# Patient Record
Sex: Female | Born: 1965 | Race: Black or African American | Hispanic: No | Marital: Married | Smoking: Never smoker
Health system: Southern US, Community
[De-identification: ages and names within clinical notes are randomized; demographics above are authoritative.]

## PROBLEM LIST (undated history)

## (undated) DIAGNOSIS — N1330 Unspecified hydronephrosis: Secondary | ICD-10-CM

## (undated) DIAGNOSIS — Z8619 Personal history of other infectious and parasitic diseases: Secondary | ICD-10-CM

## (undated) DIAGNOSIS — R519 Headache, unspecified: Secondary | ICD-10-CM

## (undated) DIAGNOSIS — R51 Headache: Secondary | ICD-10-CM

## (undated) DIAGNOSIS — Z923 Personal history of irradiation: Secondary | ICD-10-CM

## (undated) DIAGNOSIS — Z973 Presence of spectacles and contact lenses: Secondary | ICD-10-CM

## (undated) DIAGNOSIS — Z5111 Encounter for antineoplastic chemotherapy: Secondary | ICD-10-CM

## (undated) DIAGNOSIS — C250 Malignant neoplasm of head of pancreas: Secondary | ICD-10-CM

## (undated) DIAGNOSIS — D649 Anemia, unspecified: Secondary | ICD-10-CM

## (undated) DIAGNOSIS — I10 Essential (primary) hypertension: Secondary | ICD-10-CM

## (undated) DIAGNOSIS — Z8719 Personal history of other diseases of the digestive system: Secondary | ICD-10-CM

## (undated) DIAGNOSIS — J302 Other seasonal allergic rhinitis: Secondary | ICD-10-CM

## (undated) DIAGNOSIS — K219 Gastro-esophageal reflux disease without esophagitis: Secondary | ICD-10-CM

## (undated) HISTORY — DX: Personal history of irradiation: Z92.3

---

## 2003-11-24 ENCOUNTER — Other Ambulatory Visit: Admission: RE | Admit: 2003-11-24 | Discharge: 2003-11-24 | Payer: Self-pay | Admitting: Obstetrics and Gynecology

## 2003-11-30 ENCOUNTER — Ambulatory Visit (HOSPITAL_COMMUNITY): Admission: RE | Admit: 2003-11-30 | Discharge: 2003-11-30 | Payer: Self-pay | Admitting: Obstetrics and Gynecology

## 2004-03-02 ENCOUNTER — Ambulatory Visit (HOSPITAL_COMMUNITY): Admission: RE | Admit: 2004-03-02 | Discharge: 2004-03-02 | Payer: Self-pay | Admitting: Obstetrics and Gynecology

## 2004-05-12 ENCOUNTER — Inpatient Hospital Stay (HOSPITAL_COMMUNITY): Admission: AD | Admit: 2004-05-12 | Discharge: 2004-05-17 | Payer: Self-pay | Admitting: Obstetrics and Gynecology

## 2004-05-18 ENCOUNTER — Inpatient Hospital Stay (HOSPITAL_COMMUNITY): Admission: AD | Admit: 2004-05-18 | Discharge: 2004-05-18 | Payer: Self-pay | Admitting: Obstetrics and Gynecology

## 2004-06-27 ENCOUNTER — Other Ambulatory Visit: Admission: RE | Admit: 2004-06-27 | Discharge: 2004-06-27 | Payer: Self-pay | Admitting: Obstetrics and Gynecology

## 2004-07-26 ENCOUNTER — Encounter: Admission: RE | Admit: 2004-07-26 | Discharge: 2004-08-25 | Payer: Self-pay | Admitting: Obstetrics and Gynecology

## 2004-08-26 ENCOUNTER — Encounter: Admission: RE | Admit: 2004-08-26 | Discharge: 2004-09-25 | Payer: Self-pay | Admitting: Obstetrics and Gynecology

## 2004-10-26 ENCOUNTER — Encounter: Admission: RE | Admit: 2004-10-26 | Discharge: 2004-11-25 | Payer: Self-pay | Admitting: Obstetrics and Gynecology

## 2004-12-26 ENCOUNTER — Encounter: Admission: RE | Admit: 2004-12-26 | Discharge: 2005-01-25 | Payer: Self-pay | Admitting: Obstetrics and Gynecology

## 2005-01-26 ENCOUNTER — Encounter: Admission: RE | Admit: 2005-01-26 | Discharge: 2005-02-25 | Payer: Self-pay | Admitting: Obstetrics and Gynecology

## 2005-03-26 ENCOUNTER — Encounter: Admission: RE | Admit: 2005-03-26 | Discharge: 2005-04-25 | Payer: Self-pay | Admitting: Obstetrics and Gynecology

## 2005-05-26 ENCOUNTER — Encounter: Admission: RE | Admit: 2005-05-26 | Discharge: 2005-06-25 | Payer: Self-pay | Admitting: Obstetrics and Gynecology

## 2005-07-26 ENCOUNTER — Encounter: Admission: RE | Admit: 2005-07-26 | Discharge: 2005-08-25 | Payer: Self-pay | Admitting: Obstetrics and Gynecology

## 2005-08-07 ENCOUNTER — Other Ambulatory Visit: Admission: RE | Admit: 2005-08-07 | Discharge: 2005-08-07 | Payer: Self-pay | Admitting: Obstetrics and Gynecology

## 2005-08-26 ENCOUNTER — Encounter: Admission: RE | Admit: 2005-08-26 | Discharge: 2005-09-21 | Payer: Self-pay | Admitting: Obstetrics and Gynecology

## 2012-01-04 ENCOUNTER — Emergency Department (HOSPITAL_BASED_OUTPATIENT_CLINIC_OR_DEPARTMENT_OTHER)
Admission: EM | Admit: 2012-01-04 | Discharge: 2012-01-04 | Disposition: A | Discharge status: Home / Self Care | Attending: Emergency Medicine | Admitting: Emergency Medicine

## 2012-01-04 ENCOUNTER — Encounter (HOSPITAL_BASED_OUTPATIENT_CLINIC_OR_DEPARTMENT_OTHER): Payer: Self-pay

## 2012-01-04 DIAGNOSIS — M25561 Pain in right knee: Secondary | ICD-10-CM

## 2012-01-04 DIAGNOSIS — Y9289 Other specified places as the place of occurrence of the external cause: Secondary | ICD-10-CM | POA: Insufficient documentation

## 2012-01-04 DIAGNOSIS — I10 Essential (primary) hypertension: Secondary | ICD-10-CM | POA: Insufficient documentation

## 2012-01-04 DIAGNOSIS — W010XXA Fall on same level from slipping, tripping and stumbling without subsequent striking against object, initial encounter: Secondary | ICD-10-CM | POA: Insufficient documentation

## 2012-01-04 DIAGNOSIS — S93409A Sprain of unspecified ligament of unspecified ankle, initial encounter: Secondary | ICD-10-CM | POA: Insufficient documentation

## 2012-01-04 HISTORY — DX: Other seasonal allergic rhinitis: J30.2

## 2012-01-04 HISTORY — DX: Essential (primary) hypertension: I10

## 2012-01-04 NOTE — ED Notes (Signed)
Slipped on ice/fall 2 days ago-was seen by PCP/workers comp-no xrays-was given meloxicam rx

## 2012-01-04 NOTE — ED Notes (Signed)
Pt was given ice pack by RT per EDP request-is not in room when in for d/c instructions

## 2012-01-13 NOTE — ED Provider Notes (Signed)
History    46 year old female with left knee pain. Patient slipped and fell on ice outside of work about 2 days ago.  Was evaluated by PCP but no imaging studies were performed. Patient has had persistent pain since. Has been ambulatory. No numbness, tingling  or loss of strength. Denies the pain anywhere else. Denies interim trauma.  CSN: [id number]  Arrival date & time 01/04/12  1058   First MD Initiated Contact with Patient 01/04/12 1113      Chief Complaint  Patient presents with  . Fall    (Consider location/radiation/quality/duration/timing/severity/associated sxs/prior treatment) HPI  Past Medical History  Diagnosis Date  . Hypertension   . Herpes   . Seasonal allergies     Past Surgical History  Procedure Date  . Cesarean section     No family history on file.  History  Substance Use Topics  . Smoking status: Never Smoker   . Smokeless tobacco: Not on file  . Alcohol Use: No    OB History    Grav Para Term Preterm Abortions TAB SAB Ect Mult Living                  Review of Systems   Review of symptoms negative unless otherwise noted in HPI.   Allergies  Review of patient's allergies indicates no known allergies.  Home Medications   Current Outpatient Rx  Name Route Sig Dispense Refill  . ZYRTEC PO Oral Take by mouth.    Marland Kitchen HYDROCHLOROTHIAZIDE PO Oral Take by mouth.    . MELOXICAM PO Oral Take by mouth.    . SUDAFED PO Oral Take by mouth.    Marland Kitchen VALTREX PO Oral Take by mouth.      BP 174/116  Pulse 98  Temp(Src) 98.3 F (36.8 C) (Oral)  Resp 16  Ht 5\' 5"  (1.651 m)  Wt 255 lb (115.667 kg)  BMI 42.43 kg/m2  SpO2 100%  Physical Exam  Nursing note and vitals reviewed. Constitutional: No distress.       Sitting up in bed. No acute distress. Obese  HENT:  Head: Normocephalic and atraumatic.  Eyes: Conjunctivae are normal. Right eye exhibits no discharge. Left eye exhibits no discharge.  Neck: Neck supple.  Cardiovascular: Normal rate,  regular rhythm and normal heart sounds.  Exam reveals no gallop and no friction rub.   No murmur heard. Pulmonary/Chest: Effort normal and breath sounds normal. No respiratory distress.  Musculoskeletal: She exhibits tenderness. She exhibits no edema.       Lower extremities normal to inspection and symmetric as compared to each other. No deformities noted. No knee effusion appreciated. Mild tenderness along the lateral aspect of the left knee.No significant increased pain with range of motion. Skin is intact. no overlying skin changes. No ligamentous laxity. Patient is neurovascularly intact distally. Patient has a normal appearing gait.  Neurological: She is alert. She exhibits normal muscle tone.  Skin: Skin is warm and dry.  Psychiatric: She has a normal mood and affect. Her behavior is normal. Thought content normal.    ED Course  Procedures (including critical care time)  Labs Reviewed - No data to display No results found.   1. Knee pain, right   2. Ankle sprain       MDM  46 year old female with knee pain after fall 2 days ago. Exam fairly unremarkable aside from mild tenderness. Patient able to ambulate without difficulty. Suspect that maging be very low yield. If anything patient may benefit from  an MRI but there is no ligamentous laxity appreciated on my examination did not feel that this is needed emergently. Plan symptomatic treatment and outpatient followup.        Raeford Razor, MD 01/13/12 2034

## 2013-09-17 ENCOUNTER — Ambulatory Visit (INDEPENDENT_AMBULATORY_CARE_PROVIDER_SITE_OTHER): Payer: 59 | Admitting: General Surgery

## 2013-09-17 ENCOUNTER — Encounter (INDEPENDENT_AMBULATORY_CARE_PROVIDER_SITE_OTHER): Payer: Self-pay | Admitting: General Surgery

## 2013-09-17 ENCOUNTER — Encounter (INDEPENDENT_AMBULATORY_CARE_PROVIDER_SITE_OTHER): Payer: Self-pay

## 2013-09-17 VITALS — BP 138/90 | HR 84 | Temp 97.1°F | Resp 24 | Ht 64.0 in | Wt 222.0 lb

## 2013-09-17 DIAGNOSIS — Z9889 Other specified postprocedural states: Secondary | ICD-10-CM

## 2013-09-17 DIAGNOSIS — K219 Gastro-esophageal reflux disease without esophagitis: Secondary | ICD-10-CM

## 2013-09-17 DIAGNOSIS — K828 Other specified diseases of gallbladder: Secondary | ICD-10-CM | POA: Insufficient documentation

## 2013-09-17 DIAGNOSIS — I10 Essential (primary) hypertension: Secondary | ICD-10-CM

## 2013-09-17 DIAGNOSIS — Z8719 Personal history of other diseases of the digestive system: Secondary | ICD-10-CM

## 2013-09-17 DIAGNOSIS — Z98891 History of uterine scar from previous surgery: Secondary | ICD-10-CM

## 2013-09-17 NOTE — Progress Notes (Signed)
Patient ID: Frances [name], female   DOB: 1966-11-24, 47 y.o.   MRN: [id number]  Chief Complaint  Patient presents with  . Cholelithiasis    HPI Frances [name] is a 47 y.o. female.  She is referred by Dr. Marcelene Butte in [facility] for evaluation of biliary dyskinesia. Dr. Watt Climes is her PCP in [place].  She has no real chronic GI problems other than reflux. She doesn't really have much in the way of heartburn but she does have waterbrash. She could not tolerated AcipHex, but has been on omeprazole for 30 days.  Since July, she has been having intermittent episodes of epigastric pain radiating to her back. She gets cramps. She feels bloated after meals. She has mild nausea but no vomiting. This is now happening daily. Bowel movements are not really changing. She had an ultrasound on August 9 which shows a small right hepatic cyst. Gallbladder looked normal. She had an upper endoscopy on 08/13/2013. I have that report. This shows gastritis and duodenitis. Biopsies showed mild inflammation. The esophagus looked normal. H. Pylori was normal. Hepatobiliary scan with ejection fraction was performed on September 29. Ejection fraction was low at 15%, normal being greater than 33%. Cystic duct patent. She says she had some cramping after drinking the Ensure. She is in no distress today.  Past history reveals the GERD, hypertension, cesarean section 2005. Her grandmother had a cholecystectomy but no other family members. She is married with one child he works for [employer]. Denies tobacco. Drinks wine occasionally. HPI  Past Medical History  Diagnosis Date  . Hypertension   . Herpes   . Seasonal allergies     Past Surgical History  Procedure Laterality Date  . Cesarean section      No family history on file.  Social History History  Substance Use Topics  . Smoking status: Never Smoker   . Smokeless tobacco: Not on file  . Alcohol Use: No    No Known  Allergies  Current Outpatient Prescriptions  Medication Sig Dispense Refill  . dicyclomine (BENTYL) 10 MG capsule Take 20 mg by mouth every 6 (six) hours.      Marland Kitchen HYDROCHLOROTHIAZIDE PO Take by mouth daily.       Marland Kitchen omeprazole (PRILOSEC) 40 MG capsule Take 40 mg by mouth daily.      . promethazine (PHENERGAN) 12.5 MG tablet Take 25 mg by mouth every 6 (six) hours as needed for nausea.       No current facility-administered medications for this visit.    Review of Systems Review of Systems  Constitutional: Negative for fever, chills and unexpected weight change.  HENT: Negative for congestion, hearing loss, sore throat, trouble swallowing and voice change.   Eyes: Negative for visual disturbance.  Respiratory: Negative for cough and wheezing.   Cardiovascular: Negative for chest pain, palpitations and leg swelling.  Gastrointestinal: Positive for nausea, abdominal pain and abdominal distention. Negative for vomiting, diarrhea, constipation, blood in stool and anal bleeding.  Genitourinary: Negative for hematuria, vaginal bleeding and difficulty urinating.  Musculoskeletal: Positive for back pain. Negative for arthralgias.  Skin: Negative for rash and wound.  Neurological: Negative for seizures, syncope and headaches.  Hematological: Negative for adenopathy. Does not bruise/bleed easily.  Psychiatric/Behavioral: Negative for confusion.    Blood pressure 138/90, pulse 84, temperature 97.1 F (36.2 C), temperature source Oral, resp. rate 24, height 5\' 4"  (1.626 m), weight 222 lb (100.699 kg).  Physical Exam Physical Exam  Constitutional: She is oriented  to person, place, and time. She appears well-developed and well-nourished. No distress.  BMI 37.93  HENT:  Head: Normocephalic and atraumatic.  Nose: Nose normal.  Mouth/Throat: No oropharyngeal exudate.  Eyes: Conjunctivae and EOM are normal. Pupils are equal, round, and reactive to light. Left eye exhibits no discharge. No scleral  icterus.  Neck: Neck supple. No JVD present. No tracheal deviation present. No thyromegaly present.  Cardiovascular: Normal rate, regular rhythm, normal heart sounds and intact distal pulses.   No murmur heard. Pulmonary/Chest: Effort normal and breath sounds normal. No respiratory distress. She has no wheezes. She has no rales. She exhibits no tenderness.  Abdominal: Soft. Bowel sounds are normal. She exhibits no distension and no mass. There is no tenderness. There is no rebound and no guarding.  Well healed Pfannenstiel incision.  Musculoskeletal: She exhibits no edema and no tenderness.  Lymphadenopathy:    She has no cervical adenopathy.  Neurological: She is alert and oriented to person, place, and time. She exhibits normal muscle tone. Coordination normal.  Skin: Skin is warm. No rash noted. She is not diaphoretic. No erythema. No pallor.  Psychiatric: She has a normal mood and affect. Her behavior is normal. Judgment and thought content normal.    Data Reviewed Lots of note from her physicians, including gastroenterologist. Endoscopy report. Imaging studies. Laboratory work. Labs were not abnormal.  Assessment    History and symptom complex, coupled with workup to date show a suggestion acalculous cholecystitis or biliary dyskinesia. I discussed this with her in great detail. I offered her the option of elective cholecystectomy and she would like to do that.  GERD. I told her this was independent and she might continue to have waterbrash and might require further medical management.  Hypertension  History cesarean section  Obesity, BMI of 37.9     Plan    She will be scheduled for elective laparoscopic cholecystectomy with cholangiogram, possible open  I discussed the indications, details, techniques, and numerous risks of this surgery with her. She's aware of the risk of bleeding, infection, conversion to open laparotomy, bile leak, wound hernia, injury to adjacent organs  with  major reconstructive surgery. She is aware that there is a chance that the symptoms may not resolve  following cholecystectomy she may require more extensive GI evaluation. We talked about gastroparesis and I thought she was relatively low risk for that disorder. She understands all these issues. her questions were answered. She agrees with this plan.        Angelia Mould. Derrell Lolling, M.D., [facility] General and Minimally invasive Surgery Breast and Colorectal Surgery Office:   [phone] Pager:   [phone]  09/17/2013, 10:01 AM

## 2013-09-17 NOTE — Patient Instructions (Signed)
Considering your symptoms and the extensive workup and evaluation to date, I think that it is very likely that you are having gallbladder attacks.  The chance of improving or resolving your upper abdominal pain with a gallbladder operation is therefore 80% or greater.  We talked about your options, and you have decided to go ahead with a gallbladder operation. You will be scheduled for a laparoscopic cholecystectomy with cholangiogram, possible open, in the near future.      Laparoscopic Cholecystectomy Laparoscopic cholecystectomy is surgery to remove the gallbladder. The gallbladder is located slightly to the right of center in the abdomen, behind the liver. It is a concentrating and storage sac for the bile produced in the liver. Bile aids in the digestion and absorption of fats. Gallbladder disease (cholecystitis) is an inflammation of your gallbladder. This condition is usually caused by a buildup of gallstones (cholelithiasis) in your gallbladder. Gallstones can block the flow of bile, resulting in inflammation and pain. In severe cases, emergency surgery may be required. When emergency surgery is not required, you will have time to prepare for the procedure. Laparoscopic surgery is an alternative to open surgery. Laparoscopic surgery usually has a shorter recovery time. Your common bile duct may also need to be examined and explored. Your caregiver will discuss this with you if he or she feels this should be done. If stones are found in the common bile duct, they may be removed. LET YOUR CAREGIVER KNOW ABOUT:  Allergies to food or medicine.  Medicines taken, including vitamins, herbs, eyedrops, over-the-counter medicines, and creams.  Use of steroids (by mouth or creams).  Previous problems with anesthetics or numbing medicines.  History of bleeding problems or blood clots.  Previous surgery.  Other health problems, including diabetes and kidney problems.  Possibility of  pregnancy, if this applies. RISKS AND COMPLICATIONS All surgery is associated with risks. Some problems that may occur following this procedure include:  Infection.  Damage to the common bile duct, nerves, arteries, veins, or other internal organs such as the stomach or intestines.  Bleeding.  A stone may remain in the common bile duct. BEFORE THE PROCEDURE  Do not take aspirin for 3 days prior to surgery or blood thinners for 1 week prior to surgery.  Do not eat or drink anything after midnight the night before surgery.  Let your caregiver know if you develop a cold or other infectious problem prior to surgery.  You should be present 60 minutes before the procedure or as directed. PROCEDURE  You will be given medicine that makes you sleep (general anesthetic). When you are asleep, your surgeon will make several small cuts (incisions) in your abdomen. One of these incisions is used to insert a small, lighted scope (laparoscope) into the abdomen. The laparoscope helps the surgeon see into your abdomen. Carbon dioxide gas will be pumped into your abdomen. The gas allows more room for the surgeon to perform your surgery. Other operating instruments are inserted through the other incisions. Laparoscopic procedures may not be appropriate when:  There is major scarring from previous surgery.  The gallbladder is extremely inflamed.  There are bleeding disorders or unexpected cirrhosis of the liver.  A pregnancy is near term.  Other conditions make the laparoscopic procedure impossible. If your surgeon feels it is not safe to continue with a laparoscopic procedure, he or she will perform an open abdominal procedure. In this case, the surgeon will make an incision to open the abdomen. This gives the surgeon a  larger view and field to work within. This may allow the surgeon to perform procedures that sometimes cannot be performed with a laparoscope alone. Open surgery has a longer recovery  time. AFTER THE PROCEDURE  You will be taken to the recovery area where a nurse will watch and check your progress.  You may be allowed to go home the same day.  Do not resume physical activities until directed by your caregiver.  You may resume a normal diet and activities as directed. Document Released: 11/26/2005 Document Revised: 02/18/2012 Document Reviewed: 05/11/2011 [facility] Patient Information 2014 [place], [place].

## 2013-09-22 NOTE — Progress Notes (Signed)
Dr. Derrell Lolling  -- Frances [name] is coming to [facility] on 10/21 for her preop appt / labs.  Please enter her preop orders in Epic.   Thanks.

## 2013-09-23 ENCOUNTER — Other Ambulatory Visit (INDEPENDENT_AMBULATORY_CARE_PROVIDER_SITE_OTHER): Payer: Self-pay | Admitting: General Surgery

## 2013-09-24 ENCOUNTER — Encounter (HOSPITAL_COMMUNITY): Payer: Self-pay | Admitting: Pharmacy Technician

## 2013-09-29 ENCOUNTER — Ambulatory Visit (HOSPITAL_COMMUNITY)
Admission: RE | Admit: 2013-09-29 | Discharge: 2013-09-29 | Disposition: A | Discharge status: Home / Self Care | Payer: 59 | Source: Ambulatory Visit | Attending: General Surgery | Admitting: General Surgery

## 2013-09-29 ENCOUNTER — Ambulatory Visit (INDEPENDENT_AMBULATORY_CARE_PROVIDER_SITE_OTHER): Payer: 59 | Admitting: General Surgery

## 2013-09-29 ENCOUNTER — Encounter (HOSPITAL_COMMUNITY)
Admission: RE | Admit: 2013-09-29 | Discharge: 2013-09-29 | Disposition: A | Discharge status: Home / Self Care | Payer: 59 | Source: Ambulatory Visit | Attending: General Surgery | Admitting: General Surgery

## 2013-09-29 ENCOUNTER — Encounter (HOSPITAL_COMMUNITY): Payer: Self-pay

## 2013-09-29 DIAGNOSIS — K828 Other specified diseases of gallbladder: Secondary | ICD-10-CM | POA: Insufficient documentation

## 2013-09-29 DIAGNOSIS — Z01810 Encounter for preprocedural cardiovascular examination: Secondary | ICD-10-CM | POA: Insufficient documentation

## 2013-09-29 DIAGNOSIS — Z01818 Encounter for other preprocedural examination: Secondary | ICD-10-CM | POA: Insufficient documentation

## 2013-09-29 DIAGNOSIS — I10 Essential (primary) hypertension: Secondary | ICD-10-CM | POA: Insufficient documentation

## 2013-09-29 DIAGNOSIS — Z01812 Encounter for preprocedural laboratory examination: Secondary | ICD-10-CM | POA: Insufficient documentation

## 2013-09-29 HISTORY — DX: Gastro-esophageal reflux disease without esophagitis: K21.9

## 2013-09-29 LAB — CBC WITH DIFFERENTIAL/PLATELET
Basophils Absolute: 0 10*3/uL (ref 0.0–0.1)
Basophils Relative: 1 % (ref 0–1)
HCT: 39.1 % (ref 36.0–46.0)
Hemoglobin: 12.7 g/dL (ref 12.0–15.0)
MCH: 25.8 pg — ABNORMAL LOW (ref 26.0–34.0)
Monocytes Absolute: 0.3 10*3/uL (ref 0.1–1.0)
Neutro Abs: 2.3 10*3/uL (ref 1.7–7.7)
Neutrophils Relative %: 55 % (ref 43–77)
Platelets: 244 10*3/uL (ref 150–400)
RBC: 4.92 MIL/uL (ref 3.87–5.11)

## 2013-09-29 LAB — COMPREHENSIVE METABOLIC PANEL
Alkaline Phosphatase: 95 U/L (ref 39–117)
Chloride: 99 mEq/L (ref 96–112)
GFR calc Af Amer: >90 mL/min (ref >90)
GFR calc non Af Amer: 88 mL/min — ABNORMAL LOW (ref >90)
Potassium: 3.6 mEq/L (ref 3.5–5.1)
Total Bilirubin: 0.2 mg/dL — ABNORMAL LOW (ref 0.3–1.2)

## 2013-09-29 LAB — HCG, SERUM, QUALITATIVE: Preg, Serum: NEGATIVE

## 2013-09-29 MED ORDER — CHLORHEXIDINE GLUCONATE 4 % EX LIQD
1.0000 "application " | Freq: Once | CUTANEOUS | Status: DC
  Filled 2013-09-29: qty 15

## 2013-09-29 NOTE — Patient Instructions (Signed)
YOUR SURGERY IS SCHEDULED AT [facility]  ON:   Friday  10/24  REPORT TO [facility] AT:  5:30 AM      PHONE # FOR SHORT STAY IS [phone]  DO NOT EAT OR DRINK ANYTHING AFTER MIDNIGHT THE NIGHT BEFORE YOUR SURGERY.  YOU MAY BRUSH YOUR TEETH, RINSE OUT YOUR MOUTH--BUT NO WATER, NO FOOD, NO CHEWING GUM, NO MINTS, NO CANDIES, NO CHEWING TOBACCO.  PLEASE TAKE THE FOLLOWING MEDICATIONS THE AM OF YOUR SURGERY WITH A FEW SIPS OF WATER:  OMEPRAZOLE  - PHENERGAN IF NEEDED FOR NAUSEA    DO NOT BRING VALUABLES, MONEY, CREDIT CARDS.  DO NOT WEAR JEWELRY, MAKE-UP, NAIL POLISH AND NO METAL PINS OR CLIPS IN YOUR HAIR. CONTACT LENS, DENTURES / PARTIALS, GLASSES SHOULD NOT BE WORN TO SURGERY AND IN MOST CASES-HEARING AIDS WILL NEED TO BE REMOVED.  BRING YOUR GLASSES CASE, ANY EQUIPMENT NEEDED FOR YOUR CONTACT LENS. FOR PATIENTS ADMITTED TO THE HOSPITAL--CHECK OUT TIME THE DAY OF DISCHARGE IS 11:00 AM.  ALL INPATIENT ROOMS ARE PRIVATE - WITH BATHROOM, TELEPHONE, TELEVISION AND WIFI INTERNET.  IF YOU ARE BEING DISCHARGED THE SAME DAY OF YOUR SURGERY--YOU CAN NOT DRIVE YOURSELF HOME--AND SHOULD NOT GO HOME ALONE BY TAXI OR BUS.  NO DRIVING OR OPERATING MACHINERY FOR 24 HOURS FOLLOWING ANESTHESIA / PAIN MEDICATIONS.  PLEASE MAKE ARRANGEMENTS FOR SOMEONE TO BE WITH YOU AT HOME THE FIRST 24 HOURS AFTER SURGERY. RESPONSIBLE DRIVER'S NAME  MOTHER  JACKIE BASLEY  WILL BE WITH PATIENT                                               PHONE #   [phone]                              FAILURE TO FOLLOW THESE INSTRUCTIONS MAY RESULT IN THE CANCELLATION OF YOUR SURGERY.   PATIENT SIGNATURE_________________________________

## 2013-09-29 NOTE — Pre-Procedure Instructions (Signed)
PT'S B/P ELEVATED AT 168/114, HEART RATE 113 - SHE STATES SHE IS HAVING ABDOMINAL PAIN TODAY RELATED TO HER GALLBLADDER PROBLEM - SHE IS SCHEDULED FOR CHOLECYSTECTOMY ON Friday 10/24 AND IS HERE AT [facility] TODAY FOR PREOP.  PT STATES HER PAIN IS NOT ANY WORSE THAN IT HAS BEEN.  PT INSTRUCTED TO GO TO ER OR CALL DR. INGRAM IF SHE DOES FEEL THAT HER PAIN WORSENS - SHE STATES DR. INGRAM HAS ALSO GIVEN HER THOSE INSTRUCTIONS.  HER HEART RATE WAS 105 ON HER PREOP EKG. EKG AND CXR WERE DONE TODAY - PER ANESTHESIOLOGIST'S GUIDELINES.

## 2013-10-01 NOTE — H&P (Signed)
Frances [name]   MRN:  [id number]   Description: 47 year old female  Provider: [name], MD  Department: [facility]        Diagnoses    Biliary dyskinesia    -  Primary    575.8    GERD (gastroesophageal reflux disease)        530.81    Hypertension        401.9    History of cesarean section        V45.89    History of gastritis        V12.79      Reason for Visit    Cholelithiasis        Current Vitals   BP Pulse Temp(Src) Resp Ht Wt    138/90 84 97.1 F (36.2 C) (Oral) 24 5\' 4"  (1.626 m) 222 lb (100.699 kg)       BMI - 38.09 kg/m2                    History and Physical   [name], MD     Status: Signed                   HPI Frances [name] is a 47 y.o. female.  She is referred by Dr. Marcelene Butte in [facility] for evaluation of biliary dyskinesia. Dr. Watt Climes is her PCP in [place].   She has no real chronic GI problems other than reflux. She doesn't really have much in the way of heartburn but she does have waterbrash. She could not tolerated AcipHex, but has been on omeprazole for 30 days.   Since July, she has been having intermittent episodes of epigastric pain radiating to her back. She gets cramps. She feels bloated after meals. She has mild nausea but no vomiting. This is now happening daily. Bowel movements are not really changing. She had an ultrasound on August 9 which shows a small right hepatic cyst. Gallbladder looked normal. She had an upper endoscopy on 08/13/2013. I have that report. This shows gastritis and duodenitis. Biopsies showed mild inflammation. The esophagus looked normal. H. Pylori was normal. Hepatobiliary scan with ejection fraction was performed on September 29. Ejection fraction was low at 15%, normal being greater than 33%. Cystic duct patent. She says she had some cramping after drinking the Ensure. She is in no distress today.   Past history reveals the GERD, hypertension, cesarean section  2005. Her grandmother had a cholecystectomy but no other family members. She is married with one child he works for [employer]. Denies tobacco. Drinks wine occasionally.        Past Medical History   Diagnosis  Date   .  Hypertension     .  Herpes     .  Seasonal allergies           Past Surgical History   Procedure  Laterality  Date   .  Cesarean section            No family history on file.   Social History History   Substance Use Topics   .  Smoking status:  Never Smoker    .  Smokeless tobacco:  Not on file   .  Alcohol Use:  No        No Known Allergies    Current Outpatient Prescriptions   Medication  Sig  Dispense  Refill   .  dicyclomine (BENTYL)  10 MG capsule  Take 20 mg by mouth every 6 (six) hours.         Marland Kitchen  HYDROCHLOROTHIAZIDE PO  Take by mouth daily.          Marland Kitchen  omeprazole (PRILOSEC) 40 MG capsule  Take 40 mg by mouth daily.         .  promethazine (PHENERGAN) 12.5 MG tablet  Take 25 mg by mouth every 6 (six) hours as needed for nausea.       .        Review of Systems   Constitutional: Negative for fever, chills and unexpected weight change.  HENT: Negative for congestion, hearing loss, sore throat, trouble swallowing and voice change.   Eyes: Negative for visual disturbance.  Respiratory: Negative for cough and wheezing.   Cardiovascular: Negative for chest pain, palpitations and leg swelling.  Gastrointestinal: Positive for nausea, abdominal pain and abdominal distention. Negative for vomiting, diarrhea, constipation, blood in stool and anal bleeding.  Genitourinary: Negative for hematuria, vaginal bleeding and difficulty urinating.  Musculoskeletal: Positive for back pain. Negative for arthralgias.  Skin: Negative for rash and wound.  Neurological: Negative for seizures, syncope and headaches.  Hematological: Negative for adenopathy. Does not bruise/bleed easily.  Psychiatric/Behavioral: Negative for confusion.       Blood pressure 138/90, pulse 84, temperature 97.1 F (36.2 C), temperature source Oral, resp. rate 24, height 5\' 4"  (1.626 m), weight 222 lb (100.699 kg).   Physical Exam   Constitutional: She is oriented to person, place, and time. She appears well-developed and well-nourished. No distress.  BMI 37.93  HENT:   Head: Normocephalic and atraumatic.   Nose: Nose normal.   Mouth/Throat: No oropharyngeal exudate.  Eyes: Conjunctivae and EOM are normal. Pupils are equal, round, and reactive to light. Left eye exhibits no discharge. No scleral icterus.  Neck: Neck supple. No JVD present. No tracheal deviation present. No thyromegaly present.  Cardiovascular: Normal rate, regular rhythm, normal heart sounds and intact distal pulses.    No murmur heard. Pulmonary/Chest: Effort normal and breath sounds normal. No respiratory distress. She has no wheezes. She has no rales. She exhibits no tenderness.  Abdominal: Soft. Bowel sounds are normal. She exhibits no distension and no mass. There is no tenderness. There is no rebound and no guarding.  Well healed Pfannenstiel incision.  Musculoskeletal: She exhibits no edema and no tenderness.  Lymphadenopathy:    She has no cervical adenopathy.  Neurological: She is alert and oriented to person, place, and time. She exhibits normal muscle tone. Coordination normal.  Skin: Skin is warm. No rash noted. She is not diaphoretic. No erythema. No pallor.  Psychiatric: She has a normal mood and affect. Her behavior is normal. Judgment and thought content normal.      Data Reviewed Lots of note from her physicians, including gastroenterologist. Endoscopy report. Imaging studies. Laboratory work. Labs were not abnormal.   Assessment    History and symptom complex, coupled with workup to date show a suggestion acalculous cholecystitis or biliary dyskinesia. I discussed this with her in great detail. I offered her the option of elective cholecystectomy  and she would like to do that.   GERD. I told her this was independent and she might continue to have waterbrash and might require further medical management.   Hypertension   History cesarean section   Obesity, BMI of 37.9      Plan    She will be scheduled for elective laparoscopic cholecystectomy with  cholangiogram, possible open   I discussed the indications, details, techniques, and numerous risks of this surgery with her. She's aware of the risk of bleeding, infection, conversion to open laparotomy, bile leak, wound hernia, injury to adjacent organs with  major reconstructive surgery. She is aware that there is a chance that the symptoms may not resolve  following cholecystectomy she may require more extensive GI evaluation. We talked about gastroparesis and I thought she was relatively low risk for that disorder. She understands all these issues. her questions were answered. She agrees with this plan.           Angelia Mould. Derrell Lolling, M.D., [facility] General and Minimally invasive Surgery Breast and Colorectal Surgery Office:   [phone] Pager:   [phone]

## 2013-10-02 ENCOUNTER — Encounter (HOSPITAL_COMMUNITY): Payer: Self-pay | Admitting: *Deleted

## 2013-10-02 ENCOUNTER — Encounter (HOSPITAL_COMMUNITY): Payer: 59 | Admitting: Certified Registered Nurse Anesthetist

## 2013-10-02 ENCOUNTER — Ambulatory Visit (HOSPITAL_COMMUNITY)
Admission: RE | Admit: 2013-10-02 | Discharge: 2013-10-03 | Disposition: A | Discharge status: Home / Self Care | Payer: 59 | Source: Ambulatory Visit | Attending: General Surgery | Admitting: General Surgery

## 2013-10-02 ENCOUNTER — Ambulatory Visit (HOSPITAL_COMMUNITY): Payer: 59

## 2013-10-02 ENCOUNTER — Encounter (HOSPITAL_COMMUNITY)
Admission: RE | Disposition: A | Discharge status: Home / Self Care | Payer: Self-pay | Source: Ambulatory Visit | Attending: General Surgery

## 2013-10-02 ENCOUNTER — Ambulatory Visit (HOSPITAL_COMMUNITY): Payer: 59 | Admitting: Certified Registered Nurse Anesthetist

## 2013-10-02 DIAGNOSIS — I10 Essential (primary) hypertension: Secondary | ICD-10-CM | POA: Insufficient documentation

## 2013-10-02 DIAGNOSIS — R7401 Elevation of levels of liver transaminase levels: Secondary | ICD-10-CM | POA: Insufficient documentation

## 2013-10-02 DIAGNOSIS — Z79899 Other long term (current) drug therapy: Secondary | ICD-10-CM | POA: Insufficient documentation

## 2013-10-02 DIAGNOSIS — K828 Other specified diseases of gallbladder: Secondary | ICD-10-CM | POA: Diagnosis present

## 2013-10-02 DIAGNOSIS — K869 Disease of pancreas, unspecified: Secondary | ICD-10-CM | POA: Insufficient documentation

## 2013-10-02 DIAGNOSIS — K219 Gastro-esophageal reflux disease without esophagitis: Secondary | ICD-10-CM | POA: Insufficient documentation

## 2013-10-02 DIAGNOSIS — E669 Obesity, unspecified: Secondary | ICD-10-CM | POA: Insufficient documentation

## 2013-10-02 DIAGNOSIS — R000 Tachycardia, unspecified: Secondary | ICD-10-CM | POA: Insufficient documentation

## 2013-10-02 DIAGNOSIS — K811 Chronic cholecystitis: Secondary | ICD-10-CM

## 2013-10-02 DIAGNOSIS — R7402 Elevation of levels of lactic acid dehydrogenase (LDH): Secondary | ICD-10-CM | POA: Insufficient documentation

## 2013-10-02 HISTORY — PX: CHOLECYSTECTOMY: SHX55

## 2013-10-02 SURGERY — LAPAROSCOPIC CHOLECYSTECTOMY WITH INTRAOPERATIVE CHOLANGIOGRAM
Anesthesia: General | Site: Abdomen | Wound class: Contaminated

## 2013-10-02 MED ORDER — BUPIVACAINE-EPINEPHRINE PF 0.25-1:200000 % IJ SOLN
INTRAMUSCULAR | Status: DC | PRN
  Administered 2013-10-02: 20 mL

## 2013-10-02 MED ORDER — METOCLOPRAMIDE HCL 5 MG/ML IJ SOLN
INTRAMUSCULAR | Status: DC | PRN
  Administered 2013-10-02: 5 mg via INTRAVENOUS

## 2013-10-02 MED ORDER — MEPERIDINE HCL 50 MG/ML IJ SOLN: 6.2500 mg | INTRAMUSCULAR | Status: DC | PRN

## 2013-10-02 MED ORDER — IOHEXOL 300 MG/ML  SOLN
INTRAMUSCULAR | Status: DC | PRN
  Administered 2013-10-02: 12 mL

## 2013-10-02 MED ORDER — FENTANYL CITRATE 0.05 MG/ML IJ SOLN
INTRAMUSCULAR | Status: DC | PRN
  Administered 2013-10-02 (×2): 100 ug via INTRAVENOUS
  Administered 2013-10-02 (×3): 50 ug via INTRAVENOUS

## 2013-10-02 MED ORDER — ONDANSETRON HCL 4 MG/2ML IJ SOLN
INTRAMUSCULAR | Status: DC | PRN
  Administered 2013-10-02 (×2): 2 mg via INTRAVENOUS

## 2013-10-02 MED ORDER — LACTATED RINGERS IV SOLN
INTRAVENOUS | Status: DC | PRN
  Administered 2013-10-02 (×3): via INTRAVENOUS

## 2013-10-02 MED ORDER — DEXAMETHASONE SODIUM PHOSPHATE 10 MG/ML IJ SOLN
INTRAMUSCULAR | Status: DC | PRN
  Administered 2013-10-02: 10 mg via INTRAVENOUS

## 2013-10-02 MED ORDER — OXYCODONE-ACETAMINOPHEN 5-325 MG PO TABS
1.0000 | ORAL_TABLET | ORAL | Status: DC | PRN
  Administered 2013-10-02 – 2013-10-03 (×4): 1 via ORAL
  Filled 2013-10-02: qty 2
  Filled 2013-10-02 (×3): qty 1

## 2013-10-02 MED ORDER — FENTANYL CITRATE 0.05 MG/ML IJ SOLN: 12.5000 ug | INTRAMUSCULAR | Status: DC | PRN

## 2013-10-02 MED ORDER — LACTATED RINGERS IR SOLN
Status: DC | PRN
  Administered 2013-10-02: 1000 mL

## 2013-10-02 MED ORDER — PROMETHAZINE HCL 25 MG/ML IJ SOLN: 6.2500 mg | INTRAMUSCULAR | Status: DC | PRN

## 2013-10-02 MED ORDER — ENOXAPARIN SODIUM 40 MG/0.4ML ~~LOC~~ SOLN
40.0000 mg | SUBCUTANEOUS | Status: DC
  Administered 2013-10-03: 40 mg via SUBCUTANEOUS
  Filled 2013-10-02 (×2): qty 0.4

## 2013-10-02 MED ORDER — OXYCODONE HCL 5 MG PO TABS: 5.0000 mg | ORAL_TABLET | Freq: Once | ORAL | Status: DC | PRN

## 2013-10-02 MED ORDER — DICYCLOMINE HCL 10 MG PO CAPS
20.0000 mg | ORAL_CAPSULE | Freq: Four times a day (QID) | ORAL | Status: DC
  Administered 2013-10-02 (×2): 20 mg via ORAL
  Filled 2013-10-02 (×8): qty 2

## 2013-10-02 MED ORDER — LIDOCAINE HCL (CARDIAC) 20 MG/ML IV SOLN
INTRAVENOUS | Status: DC | PRN
  Administered 2013-10-02: 100 mg via INTRAVENOUS

## 2013-10-02 MED ORDER — ESMOLOL HCL 10 MG/ML IV SOLN
INTRAVENOUS | Status: DC | PRN
  Administered 2013-10-02: 10 mg via INTRAVENOUS
  Administered 2013-10-02: 20 mg via INTRAVENOUS
  Administered 2013-10-02: 10 mg via INTRAVENOUS

## 2013-10-02 MED ORDER — HYDROCHLOROTHIAZIDE 25 MG PO TABS
12.5000 mg | ORAL_TABLET | Freq: Every evening | ORAL | Status: DC
  Administered 2013-10-02: 12.5 mg via ORAL
  Filled 2013-10-02 (×2): qty 0.5

## 2013-10-02 MED ORDER — CEFAZOLIN SODIUM-DEXTROSE 2-3 GM-% IV SOLR
INTRAVENOUS | Status: AC
  Filled 2013-10-02: qty 50

## 2013-10-02 MED ORDER — IOHEXOL 300 MG/ML  SOLN
25.0000 mL | INTRAMUSCULAR | Status: AC
  Administered 2013-10-02 (×2): 25 mL via ORAL

## 2013-10-02 MED ORDER — PANTOPRAZOLE SODIUM 40 MG PO TBEC
40.0000 mg | DELAYED_RELEASE_TABLET | Freq: Every day | ORAL | Status: DC
  Administered 2013-10-02 – 2013-10-03 (×2): 40 mg via ORAL
  Filled 2013-10-02 (×2): qty 1

## 2013-10-02 MED ORDER — LABETALOL HCL 5 MG/ML IV SOLN
INTRAVENOUS | Status: DC | PRN
  Administered 2013-10-02 (×3): 5 mg via INTRAVENOUS

## 2013-10-02 MED ORDER — OXYCODONE HCL 5 MG/5ML PO SOLN
5.0000 mg | Freq: Once | ORAL | Status: DC | PRN
Start: 2013-10-02 — End: 2013-10-02
  Filled 2013-10-02: qty 5

## 2013-10-02 MED ORDER — BUPIVACAINE-EPINEPHRINE 0.25% -1:200000 IJ SOLN
INTRAMUSCULAR | Status: AC
  Filled 2013-10-02: qty 1

## 2013-10-02 MED ORDER — SUCCINYLCHOLINE CHLORIDE 20 MG/ML IJ SOLN
INTRAMUSCULAR | Status: DC | PRN
  Administered 2013-10-02: 100 mg via INTRAVENOUS

## 2013-10-02 MED ORDER — ONDANSETRON HCL 4 MG/2ML IJ SOLN
4.0000 mg | Freq: Four times a day (QID) | INTRAMUSCULAR | Status: DC | PRN
Start: 2013-10-02 — End: 2013-10-03

## 2013-10-02 MED ORDER — CYCLOBENZAPRINE HCL 10 MG PO TABS
10.0000 mg | ORAL_TABLET | Freq: Every day | ORAL | Status: DC
  Administered 2013-10-02: 10 mg via ORAL
  Filled 2013-10-02 (×2): qty 1

## 2013-10-02 MED ORDER — POTASSIUM CHLORIDE IN NACL 20-0.9 MEQ/L-% IV SOLN
INTRAVENOUS | Status: DC
  Administered 2013-10-02 – 2013-10-03 (×3): via INTRAVENOUS
  Filled 2013-10-02 (×5): qty 1000

## 2013-10-02 MED ORDER — MIDAZOLAM HCL 5 MG/5ML IJ SOLN
INTRAMUSCULAR | Status: DC | PRN
  Administered 2013-10-02 (×2): 0.5 mg via INTRAVENOUS

## 2013-10-02 MED ORDER — INFLUENZA VAC SPLIT QUAD 0.5 ML IM SUSP
0.5000 mL | INTRAMUSCULAR | Status: AC
  Administered 2013-10-03: 0.5 mL via INTRAMUSCULAR
  Filled 2013-10-02 (×2): qty 0.5

## 2013-10-02 MED ORDER — PROMETHAZINE HCL 25 MG PO TABS: 25.0000 mg | ORAL_TABLET | Freq: Four times a day (QID) | ORAL | Status: DC | PRN

## 2013-10-02 MED ORDER — HYDRALAZINE HCL 20 MG/ML IJ SOLN
INTRAMUSCULAR | Status: DC | PRN
  Administered 2013-10-02: 10 mg via INTRAVENOUS

## 2013-10-02 MED ORDER — SODIUM CHLORIDE 0.9 % IR SOLN
Status: DC | PRN
  Administered 2013-10-02: 12 mL

## 2013-10-02 MED ORDER — IOHEXOL 300 MG/ML  SOLN
100.0000 mL | Freq: Once | INTRAMUSCULAR | Status: AC | PRN
  Administered 2013-10-02: 100 mL via INTRAVENOUS

## 2013-10-02 MED ORDER — NEOSTIGMINE METHYLSULFATE 1 MG/ML IJ SOLN
INTRAMUSCULAR | Status: DC | PRN
  Administered 2013-10-02: 2 mg via INTRAVENOUS

## 2013-10-02 MED ORDER — ONDANSETRON HCL 4 MG PO TABS: 4.0000 mg | ORAL_TABLET | Freq: Four times a day (QID) | ORAL | Status: DC | PRN

## 2013-10-02 MED ORDER — PROPOFOL 10 MG/ML IV BOLUS
INTRAVENOUS | Status: DC | PRN
  Administered 2013-10-02: 200 mg via INTRAVENOUS

## 2013-10-02 MED ORDER — HYDROMORPHONE HCL PF 1 MG/ML IJ SOLN
INTRAMUSCULAR | Status: AC
  Filled 2013-10-02: qty 1

## 2013-10-02 MED ORDER — CISATRACURIUM BESYLATE (PF) 10 MG/5ML IV SOLN
INTRAVENOUS | Status: DC | PRN
  Administered 2013-10-02: 10 mg via INTRAVENOUS

## 2013-10-02 MED ORDER — CEFAZOLIN SODIUM-DEXTROSE 2-3 GM-% IV SOLR
2.0000 g | INTRAVENOUS | Status: AC
  Administered 2013-10-02: 2 g via INTRAVENOUS

## 2013-10-02 MED ORDER — HYDROMORPHONE HCL PF 1 MG/ML IJ SOLN
0.2500 mg | INTRAMUSCULAR | Status: DC | PRN
  Administered 2013-10-02: 0.5 mg via INTRAVENOUS

## 2013-10-02 SURGICAL SUPPLY — 38 items
APPLIER CLIP ROT 10 11.4 M/L (STAPLE) ×2
BENZOIN TINCTURE PRP APPL 2/3 (GAUZE/BANDAGES/DRESSINGS) ×2 IMPLANT
CANISTER SUCTION 2500CC (MISCELLANEOUS) ×2 IMPLANT
CLIP APPLIE ROT 10 11.4 M/L (STAPLE) ×1 IMPLANT
CLOTH BEACON ORANGE TIMEOUT ST (SAFETY) ×2 IMPLANT
COVER MAYO STAND STRL (DRAPES) ×2 IMPLANT
DECANTER SPIKE VIAL GLASS SM (MISCELLANEOUS) ×2 IMPLANT
DERMABOND ADVANCED (GAUZE/BANDAGES/DRESSINGS) ×1
DERMABOND ADVANCED .7 DNX12 (GAUZE/BANDAGES/DRESSINGS) ×1 IMPLANT
DRAPE C-ARM 42X120 X-RAY (DRAPES) ×2 IMPLANT
DRAPE LAPAROSCOPIC ABDOMINAL (DRAPES) ×2 IMPLANT
ELECT REM PT RETURN 9FT ADLT (ELECTROSURGICAL) ×2
ELECTRODE REM PT RTRN 9FT ADLT (ELECTROSURGICAL) ×1 IMPLANT
GLOVE BIO SURGEON STRL SZ 6.5 (GLOVE) ×2 IMPLANT
GLOVE BIOGEL PI IND STRL 7.0 (GLOVE) ×1 IMPLANT
GLOVE BIOGEL PI INDICATOR 7.0 (GLOVE) ×1
GLOVE EUDERMIC 7 POWDERFREE (GLOVE) ×2 IMPLANT
GOWN PREVENTION PLUS LG XLONG (DISPOSABLE) ×2 IMPLANT
GOWN STRL REIN 3XL LVL4 (GOWN DISPOSABLE) ×2 IMPLANT
GOWN STRL REIN XL XLG (GOWN DISPOSABLE) ×6 IMPLANT
HEMOSTAT SNOW SURGICEL 2X4 (HEMOSTASIS) IMPLANT
IV LACTATED RINGER IRRG 3000ML (IV SOLUTION) ×1
IV LR IRRIG 3000ML ARTHROMATIC (IV SOLUTION) ×1 IMPLANT
KIT BASIN OR (CUSTOM PROCEDURE TRAY) ×2 IMPLANT
NS IRRIG 1000ML POUR BTL (IV SOLUTION) ×2 IMPLANT
POUCH SPECIMEN RETRIEVAL 10MM (ENDOMECHANICALS) IMPLANT
SCISSORS LAP 5X35 DISP (ENDOMECHANICALS) ×2 IMPLANT
SET CHOLANGIOGRAPH MIX (MISCELLANEOUS) ×2 IMPLANT
SET IRRIG TUBING LAPAROSCOPIC (IRRIGATION / IRRIGATOR) ×2 IMPLANT
SOLUTION ANTI FOG 6CC (MISCELLANEOUS) ×2 IMPLANT
STRIP CLOSURE SKIN 1/2X4 (GAUZE/BANDAGES/DRESSINGS) ×2 IMPLANT
SUT MNCRL AB 4-0 PS2 18 (SUTURE) ×2 IMPLANT
TOWEL OR 17X26 10 PK STRL BLUE (TOWEL DISPOSABLE) ×6 IMPLANT
TRAY LAP CHOLE (CUSTOM PROCEDURE TRAY) ×2 IMPLANT
TROCAR BLADELESS OPT 5 75 (ENDOMECHANICALS) ×4 IMPLANT
TROCAR XCEL BLUNT TIP 100MML (ENDOMECHANICALS) ×2 IMPLANT
TROCAR XCEL NON-BLD 11X100MML (ENDOMECHANICALS) ×2 IMPLANT
TUBING INSUFFLATION 10FT LAP (TUBING) ×2 IMPLANT

## 2013-10-02 NOTE — Transfer of Care (Signed)
Immediate Anesthesia Transfer of Care Note  Patient: Frances [name]  Procedure(s) Performed: Procedure(s): LAPAROSCOPIC CHOLECYSTECTOMY WITH INTRAOPERATIVE CHOLANGIOGRAM POSSIBLE OPEN  (N/A)  Patient Location: PACU  Anesthesia Type:General  Level of Consciousness: awake, alert , oriented, patient cooperative and responds to stimulation  Airway & Oxygen Therapy: Patient Spontanous Breathing and Patient connected to face mask oxygen  Post-op Assessment: Report given to PACU RN, Post -op Vital signs reviewed and stable and Patient moving all extremities  Post vital signs: Reviewed and stable  Complications: No apparent anesthesia complications

## 2013-10-02 NOTE — Anesthesia Procedure Notes (Signed)
Procedure Name: Intubation Date/Time: 10/02/2013 7:46 AM Performed by: Edison Pace Pre-anesthesia Checklist: Patient identified, Timeout performed, Emergency Drugs available, Suction available and Patient being monitored Patient Re-evaluated:Patient Re-evaluated prior to inductionOxygen Delivery Method: Circle system utilized Preoxygenation: Pre-oxygenation with 100% oxygen Intubation Type: IV induction and Cricoid Pressure applied Ventilation: Mask ventilation without difficulty Laryngoscope Size: Mac and 4 Grade View: Grade II Tube size: 7.5 mm Number of attempts: 1 Airway Equipment and Method: Stylet Placement Confirmation: positive ETCO2 and breath sounds checked- equal and bilateral (arytenoids visualized unable to see cords with cricoid press/head lift) Secured at: 21 cm Tube secured with: Tape Dental Injury: Teeth and Oropharynx as per pre-operative assessment  Difficulty Due To: Difficulty was anticipated, Difficult Airway- due to large tongue, Difficult Airway- due to limited oral opening and Difficult Airway- due to anterior larynx Future Recommendations: Recommend- induction with short-acting agent, and alternative techniques readily available

## 2013-10-02 NOTE — Anesthesia Preprocedure Evaluation (Addendum)
Anesthesia Evaluation  Patient identified by MRN, date of birth, ID band Patient awake    Reviewed: Allergy & Precautions, H&P , NPO status , Patient's Chart, lab work & pertinent test results  Airway Mallampati: II TM Distance: >3 FB Neck ROM: Full    Dental  (+) Dental Advisory Given   Pulmonary neg pulmonary ROS,  breath sounds clear to auscultation        Cardiovascular hypertension, Pt. on medications Rhythm:Regular Rate:Normal     Neuro/Psych negative neurological ROS  negative psych ROS   GI/Hepatic Neg liver ROS, GERD-  ,  Endo/Other  negative endocrine ROS  Renal/GU negative Renal ROS     Musculoskeletal negative musculoskeletal ROS (+)   Abdominal (+) + obese,   Peds  Hematology negative hematology ROS (+)   Anesthesia Other Findings   Reproductive/Obstetrics negative OB ROS                           Anesthesia Physical Anesthesia Plan  ASA: II  Anesthesia Plan: General   Post-op Pain Management:    Induction: Intravenous  Airway Management Planned: Oral ETT  Additional Equipment:   Intra-op Plan:   Post-operative Plan: Extubation in OR  Informed Consent: I have reviewed the patients History and Physical, chart, labs and discussed the procedure including the risks, benefits and alternatives for the proposed anesthesia with the patient or authorized representative who has indicated his/her understanding and acceptance.   Dental advisory given  Plan Discussed with: CRNA  Anesthesia Plan Comments:         Anesthesia Quick Evaluation  

## 2013-10-02 NOTE — Interval H&P Note (Signed)
History and Physical Interval Note:  10/02/2013 6:26 AM  Frances [name]  has presented today for surgery, with the diagnosis of biliary dyskinesia  The goals and the various methods of treatment have been discussed with the patient and family. After consideration of risks, benefits and other options for treatment, the patient has consented to  Procedure(s): LAPAROSCOPIC CHOLECYSTECTOMY WITH INTRAOPERATIVE CHOLANGIOGRAM POSSIBLE OPEN  (N/A) as a surgical intervention .  The patient's history has been reviewed, patient examined, no change in status, stable for surgery.  I have reviewed the patient's chart and labs.  Questions were answered to the patient's satisfaction.     [name]

## 2013-10-02 NOTE — Progress Notes (Signed)
CT scan of abdomen and pelvis was performed today because of perception of retroperitoneal mass at the time of surgery, not suspected preoperatively.  There was a large ill-defined pancreatic head mass measuring 4.7 x 5.0 cm. There was moderate pancreatic ductal dilatation. No significant biliary dilatation. Numerous mildly enlarged lymph nodes within the base of the mesentery. No adenopathy within the porta hepatis. 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  These findings are concerning for pancreatic neoplasia, particularly adenocarcinoma. Lymphoma would be less likely.  Plan: I discussed the CT findings with the patient and her husband. I told them that tissue diagnosis was necessary to move forward. They understand and agree. I advised referral to a gastroenterologist in [place] and they agree with that to schedule EUS with biopsy.  I have ordered liver function tests, CEA, and CA 19-9 with tomorrow morning's lab.  In addition to referral to gastroenterlogy, she'll followup with me in approximately 3 weeks.    Angelia Mould. Derrell Lolling, M.D., [facility] General and Minimally invasive Surgery Breast and Colorectal Surgery Office:   [phone] Pager:   [phone]

## 2013-10-02 NOTE — Op Note (Signed)
Patient Name:           Frances [name]   Date of Surgery:        10/02/2013  Pre op Diagnosis:      Chronic cholecystitis  Post op Diagnosis:    Chronic cholecystitis, question retroperitoneal mass  Procedure:                 Laparoscopic cholecystectomy with cholangiogram  Surgeon:                     Angelia Mould. Derrell Lolling, M.D., FACS  Assistant:                      Lendon Ka, M.D.  Operative Indications:  Frances [name] is a 47 y.o. female. She is referred by Dr. Marcelene Butte in [facility] for evaluation of biliary dyskinesia. Dr. Watt Climes is her PCP in [place].  She has no real chronic GI problems other than reflux. She doesn't really have much in the way of heartburn but she does have waterbrash. She could not tolerated AcipHex, but has been on omeprazole for 30 days.  Since July, she has been having intermittent episodes of epigastric pain radiating to her back. She gets cramps. She feels bloated after meals. She has mild nausea but no vomiting. This is now happening daily. Bowel movements are not really changing. She had an ultrasound in [facility] on August 9 which reportedly shows a small right hepatic cyst. Gallbladder looked normal. She had an upper endoscopy on 08/13/2013. I have that report. This shows gastritis and duodenitis. Biopsies showed mild inflammation. The esophagus looked normal. H. Pylori was normal. Hepatobiliary scan with ejection fraction was performed on September 29. Ejection fraction was low at 15%, normal being greater than 33%. Cystic duct patent. She says she had some cramping after drinking the Ensure. She is in no distress today.  Past history reveals the GERD, hypertension, cesarean section 2005. Her grandmother had a cholecystectomy but no other family members. She is married with one child he works for [employer]. Denies tobacco. Drinks wine occasionally.    Operative Findings:       The right hemidiaphragm was noticeably elevated  displacing the liver and gallbladder cephalad.The gallbladder was thin-walled but looked chronically inflamed. There were numerous chronic adhesions to the gallbladder, suggesting prior inflammatory episodes. The anatomy of the cystic duct cystic artery and common bile duct were normal. The cholangiogram was normal showing normal intrahepatic and extrahepatic biliary anatomy, no filling defects, and no obstruction was good flow of contrast into the duodenum. The distal CBD tapered smoothly as it entered the duodenum. There was a retroperitoneal fullness which displaced the stomach anteriorly affecting the visualization somewhat. The stomach looked normal. The transverse colon and omentum looked normal.. We lifted up the transverse colon the small bowel root of mesentery looked quite full but there was no sign of any neoplasia. There is no sign of any hemorrhage.  Procedure in Detail:          Following the induction of general endotracheal anesthesia the patient's abdomen was prepped and draped in a sterile fashion. Intravenous antibiotics were given and a surgical time out was performed. 0.5% Marcaine with epinephrine was used as a local infiltration anesthetic. At 11 mm Hassan trocar was placed in the infraumbilical position with an open technique. This was secured with a pursestring suture of 0 Vicryl and pneumoperitoneum was created the video camera was inserted.  An 11 mm trocar was placed in the subxiphoid region and two 5 mm trocars placed in the right upper quadrant.     Displacement of the stomach anteriorly persisted following decompression with an oral gastric tube. Angled camera allowed [place] to visualize the operative field. The adhesions were taken down off of the gallbladder. The infundibulum was retracted. The peritoneum around the neck of the gallbladder was dissected away. We isolated the cystic artery and cystic duct and could see the liver behind these structures. The cystic artery was isolated as  well the wall the gallbladder, secured with multiple metal clips and divided. A cholangiocatheter was inserted into the cystic duct and a cholangiogram obtained using the C-arm. The cholangiogram was normal as described above. The cholangiogram catheter was removed, the cystic duct was secured with 3 metal clips and divided. The gallbladder was dissected from its bed with electrocautery, placed in a specimen bag and removed. We spilled a little bit of green viscous bile but no stones were seen. We copiously irrigated the subphrenic and subhepatic spaces until the irrigation fluid was completely clear. There is no sign of bleeding or bile leak.     We lifted up the transverse colon and omentum and visualized the root of mesentery and the retroperitoneum. It apeared  Solid and  it was diffusely  full. We felt that this looked more like a soft tissue fullness rather than aneurysm. No gross evidence of neoplasia.  There was nothing to biopsy.  We elected to do no further surgery and the plan a CT scan postop     We checked the operative field one more time. Everything looked good. There was no residual fluid. The trocars were removed. The pneumoperitoneum was released. There was no bleeding. The fascia at the umbilicus closed with 0 Vicryl sutures and skin closed with subcuticular sutures of 4-0 Monocryl and Dermabond. She was taken to the recovery room in stable condition. EBL 15 cc. Counts correct. Consultations none.     Angelia Mould. Derrell Lolling, M.D., FACS General and Minimally Invasive Surgery Breast and Colorectal Surgery  10/02/2013 9:10 AM

## 2013-10-02 NOTE — Anesthesia Postprocedure Evaluation (Signed)
Anesthesia Post Note  Patient: Frances [name]  Procedure(s) Performed: Procedure(s) (LRB): LAPAROSCOPIC CHOLECYSTECTOMY WITH INTRAOPERATIVE CHOLANGIOGRAM POSSIBLE OPEN  (N/A)  Anesthesia type: General  Patient location: PACU  Post pain: Pain level controlled  Post assessment: Post-op Vital signs reviewed  Last Vitals: BP 158/98  Pulse 98  Temp(Src) 36.7 C (Oral)  Resp 18  SpO2 98%  Post vital signs: Reviewed  Level of consciousness: sedated  Complications: No apparent anesthesia complications

## 2013-10-03 LAB — CBC
Hemoglobin: 11.9 g/dL — ABNORMAL LOW (ref 12.0–15.0)
RBC: 4.49 MIL/uL (ref 3.87–5.11)
RDW: 14.7 % (ref 11.5–15.5)

## 2013-10-03 LAB — COMPREHENSIVE METABOLIC PANEL
Albumin: 3.4 g/dL — ABNORMAL LOW (ref 3.5–5.2)
Alkaline Phosphatase: 101 U/L (ref 39–117)
BUN: 9 mg/dL (ref 6–23)
Chloride: 102 mEq/L (ref 96–112)
GFR calc Af Amer: >90 mL/min (ref >90)
GFR calc non Af Amer: >90 mL/min (ref >90)
Potassium: 4 mEq/L (ref 3.5–5.1)
Total Protein: 6.5 g/dL (ref 6.0–8.3)

## 2013-10-03 LAB — CEA: CEA: 20.4 ng/mL — ABNORMAL HIGH (ref 0.0–5.0)

## 2013-10-03 LAB — CANCER ANTIGEN 19-9: CA 19-9: 4.9 U/mL — ABNORMAL LOW (ref <35.0)

## 2013-10-03 LAB — LIPASE, BLOOD: Lipase: 11 U/L (ref 11–59)

## 2013-10-03 MED ORDER — DICYCLOMINE HCL 20 MG PO TABS
20.0000 mg | ORAL_TABLET | Freq: Four times a day (QID) | ORAL | Status: DC
  Administered 2013-10-03 (×2): 20 mg via ORAL
  Filled 2013-10-03 (×5): qty 1

## 2013-10-03 MED ORDER — OXYCODONE-ACETAMINOPHEN 5-325 MG PO TABS: 1.0000 | ORAL_TABLET | ORAL | Status: DC | PRN

## 2013-10-03 NOTE — Progress Notes (Signed)
Assessment unchanged. Pt and husband verbalized understanding of dc instructions via teach back. Introduced to My Chart and plans to sign up soon. " I've heard of My Chart before,"  Stated pt. Script x 1 given as provided by MD. VSS stable. Dr. Abbey Chatters aware of recent lab values and vital signs with order to proceed with discharge. Discharged via wc to front entrance to meet awaiting vehicle to carry home. Accompanied by husband, family and NT.

## 2013-10-03 NOTE — Progress Notes (Signed)
1 Day Post-Op  Subjective: She is sore.  Walking.  Tolerating diet.  Objective: Vital signs in last 24 hours: Temp:  [97.8 F (36.6 C)-98.7 F (37.1 C)] 98.7 F (37.1 C) (10/25 0553) Pulse Rate:  [103-115] 115 (10/25 0553) Resp:  [16-20] 16 (10/25 0553) BP: (142-169)/(85-99) 142/85 mmHg (10/25 0553) SpO2:  [96 %-100 %] 96 % (10/25 0553) Weight:  [219 lb (99.338 kg)] 219 lb (99.338 kg) (10/24 1339) Last BM Date: 10/01/13  Intake/Output from previous day: 10/24 0701 - 10/25 0700 In: 4501.7 [P.O.:240; I.V.:4261.7] Out: 2425 [Urine:2175] Intake/Output this shift: Total I/O In: 240 [P.O.:240] Out: 650 [Urine:650]  PE: General- In NAD Abdomen-soft, incisions clean and intact  Lab Results:  No results found for this basename: WBC, HGB, HCT, PLT,  in the last 72 hours BMET  Recent Labs  10/03/13 0410  NA 137  K 4.0  CL 102  CO2 26  GLUCOSE 116*  BUN 9  CREATININE 0.78  CALCIUM 9.7   PT/INR No results found for this basename: LABPROT, INR,  in the last 72 hours Comprehensive Metabolic Panel:    Component Value Date/Time   NA 137 10/03/2013 0410   K 4.0 10/03/2013 0410   CL 102 10/03/2013 0410   CO2 26 10/03/2013 0410   BUN 9 10/03/2013 0410   CREATININE 0.78 10/03/2013 0410   GLUCOSE 116* 10/03/2013 0410   CALCIUM 9.7 10/03/2013 0410   AST 87* 10/03/2013 0410   ALT 76* 10/03/2013 0410   ALKPHOS 101 10/03/2013 0410   BILITOT 0.3 10/03/2013 0410   PROT 6.5 10/03/2013 0410   ALBUMIN 3.4* 10/03/2013 0410     Studies/Results: Dg Cholangiogram Operative  10/02/2013   CLINICAL DATA:  Biliary dyskinesia  EXAM: INTRAOPERATIVE CHOLANGIOGRAM  TECHNIQUE: Cholangiographic images from the C-arm fluoroscopic device were submitted for interpretation post-operatively. Please see the procedural report for the amount of contrast and the fluoroscopy time utilized.  COMPARISON:  None.  FINDINGS: 16 seconds of fluoroscopy was utilized. Injections into the cystic duct remnant  were performed and reveal mild extrinsic compression upon the midportion of the common bile duct although this is not obstructive in nature. A few small mobile filling defects are noted just below the confluence of the common hepatic duct and cystic duct. These may represent retained calculi.   Electronically Signed   By: Alcide Clever M.D.   On: 10/02/2013 08:53   Ct Abdomen Pelvis W Contrast  10/02/2013   CLINICAL DATA:  Epigastric pain. Laparoscopic cholecystectomy today. No given history of malignancy.  EXAM: CT ABDOMEN AND PELVIS WITH CONTRAST  TECHNIQUE: Multidetector CT imaging of the abdomen and pelvis was performed using the standard protocol following bolus administration of intravenous contrast.  CONTRAST:  OMNIPAQUE IOHEXOL 300 MG/ML  SOLN  COMPARISON:  Abdominal ultrasound 07/17/2013, hepatobiliary scan 09/07/2013 and intraoperative cholangiogram 10/02/2013.  FINDINGS: Mild linear atelectasis is present at both lung bases. There is no significant pleural effusion.  There is minimal fluid in the cholecystectomy bed and surrounding the liver. A small amount of pneumoperitoneum is expected following recent surgery. Free pelvic fluid is also noted. There are no suspicious focal fluid collections.  There is a large ill-defined pancreatic head mass, measuring 4.7 x 5.0 cm on image 31. There is associated moderate pancreatic ductal dilatation in the pancreatic body and tail. There is no significant biliary ductal dilatation. Numerous mildly to moderately enlarged lymph nodes are present within the base of the mesentery. There is no significant adenopathy within the  porta hepatis. There is a venous occlusion at the SMV splenic vein confluence without evidence of acute thrombus. The pancreatic mass abuts the superior mesenteric artery, although does not fully encase or significantly narrow it. There are no other significant vascular findings.  Several low density hepatic lesions are noted, nonspecific,  although likely cysts. No enhancing lesions are identified. The spleen and adrenal glands appear normal. The left kidney appears normal. Within the upper pole of the right kidney is a simple appearing cyst which measures 2.7 cm in diameter. There is no hydronephrosis.  No significant abnormalities of the stomach, small bowel, appendix or colon are seen. There is stool throughout the colon. Intrauterine device is noted. There is no adnexal mass. Postsurgical changes are noted at the umbilicus.  No acute osseous findings or focal lytic lesions are identified. There is increased density throughout the L1 and T11 vertebral bodies which is of undetermined etiology.  IMPRESSION: 1. No unexpected findings related to cholecystectomy performed today. There is some free pelvic fluid and a small amount of fluid in the cholecystectomy bed.) 2. Large ill-defined pancreatic mass associated with pancreatic ductal dilatation, venous occlusion and partial arterial encasement, highly suspicious for malignancy. There is no associated biliary dilatation. The mass itself is concerning for pancreatic adenocarcinoma. However, the multiple prominent lymph nodes within the base of the mesentery would be unusual for pancreatic cancer. Therefore, the possibility of lymphoma with pancreatic involvement should be considered. Tissue sampling recommended. 3. Nonspecific sclerosis of the T11 and L1 vertebral bodies, probably incidental. If the patient proves to have lymphoma, this may require further evaluation.) 4. Probable incidental small hepatic cysts. 5. These results were called by telephone at the time of interpretation on 10/02/2013 at 4:10 PM to Dr. Claud Kelp , who verbally acknowledged these results.   Electronically Signed   By: Roxy Horseman M.D.   On: 10/02/2013 16:11    Anti-infectives: Anti-infectives   Start     Dose/Rate Route Frequency Ordered Stop   10/02/13 0615  ceFAZolin (ANCEF) IVPB 2 g/50 mL premix     2 g 100  mL/hr over 30 Minutes Intravenous On call to O.R. 10/02/13 0603 10/02/13 0730      Assessment Active Problems:   Biliary dyskinesia s/p lap chole with IOC- has been tachycardic postop; HR 102 on my exam; Slight elevation of transaminases.   Pancreatic mass-outpatient work up planned.    LOS: 1 day   Plan: Check stat CBC.  If this does not show a significant abnormality and she feels okay, may be able to go home later today.   Nyx Keady J 10/03/2013

## 2013-10-03 NOTE — Progress Notes (Signed)
HR 105, hgb is 11.9.  She can be discharged.

## 2013-10-05 ENCOUNTER — Telehealth: Payer: Self-pay

## 2013-10-05 ENCOUNTER — Encounter (HOSPITAL_COMMUNITY): Payer: Self-pay | Admitting: Pharmacy Technician

## 2013-10-05 ENCOUNTER — Other Ambulatory Visit (INDEPENDENT_AMBULATORY_CARE_PROVIDER_SITE_OTHER): Payer: Self-pay

## 2013-10-05 ENCOUNTER — Encounter (HOSPITAL_COMMUNITY): Payer: Self-pay | Admitting: General Surgery

## 2013-10-05 ENCOUNTER — Other Ambulatory Visit: Payer: Self-pay

## 2013-10-05 DIAGNOSIS — K8689 Other specified diseases of pancreas: Secondary | ICD-10-CM

## 2013-10-05 NOTE — Progress Notes (Signed)
10-05-13 Labs done 10-03-13 CBC,CMP,CEA,CA19.9 in Epic.

## 2013-10-05 NOTE — Telephone Encounter (Signed)
Message copied by Donata Duff on Mon Oct 05, 2013 11:34 AM ------      Message from: Rachael Fee      Created: Mon Oct 05, 2013 11:33 AM       Alexia Freestone, she needs upper EUS, radial +/- linear, 60 min for pancreatic mass, ++MAC.  11;15am. November 3rd, Monday.                  ----- Message -----         From: Donata Duff, CMA         Sent: 10/05/2013   9:11 AM           To: Rachael Fee, MD                        ----- Message -----         From: Fatima Sanger         Sent: 10/05/2013   9:08 AM           To: Donata Duff, CMA            Patient needs an appt with Dr. Christella Hartigan immediately for evaluation for EUS and biopsy of pancreatic mass.            Call me with any questions [phone].            Thank you,      Stevie       ------

## 2013-10-05 NOTE — Progress Notes (Signed)
09-29-13 Serum preg-Epic.

## 2013-10-06 ENCOUNTER — Encounter (INDEPENDENT_AMBULATORY_CARE_PROVIDER_SITE_OTHER): Payer: Self-pay

## 2013-10-06 NOTE — Telephone Encounter (Signed)
Left message on machine to call back  

## 2013-10-07 ENCOUNTER — Telehealth (INDEPENDENT_AMBULATORY_CARE_PROVIDER_SITE_OTHER): Payer: Self-pay

## 2013-10-07 ENCOUNTER — Encounter (INDEPENDENT_AMBULATORY_CARE_PROVIDER_SITE_OTHER): Payer: Self-pay

## 2013-10-07 NOTE — Telephone Encounter (Signed)
She may take MiraLAX 2 or 3 times a day, if needed to correct constipation.

## 2013-10-07 NOTE — Telephone Encounter (Signed)
EUS scheduled, pt instructed and medications reviewed.  Patient instructions mailed to home.  Patient to call with any questions or concerns.  

## 2013-10-07 NOTE — Telephone Encounter (Signed)
Pt called in stating she has only had 1 BM since surgery. She is having abdominal pain that feels like she is backed up and maybe gassy. Advised her to try miralax to try and get her to have regular BM's. She took gas x once. Advised he to continue gas x as directed and to give the miralax 24-48 hours and see if symptoms get better. If not she will call our office by Friday for more directions on what she should try. Let her know I will send note to Dr Derrell Lolling and if he suggests anything further we will call her back.

## 2013-10-08 NOTE — Telephone Encounter (Signed)
LMOM to call back. Dr Derrell Lolling advises miralax 2-3 times daily if needed.

## 2013-10-12 ENCOUNTER — Ambulatory Visit (HOSPITAL_COMMUNITY): Payer: 59 | Admitting: Anesthesiology

## 2013-10-12 ENCOUNTER — Encounter (HOSPITAL_COMMUNITY): Payer: 59 | Admitting: Anesthesiology

## 2013-10-12 ENCOUNTER — Encounter (HOSPITAL_COMMUNITY)
Admission: RE | Disposition: A | Discharge status: Home / Self Care | Payer: Self-pay | Source: Ambulatory Visit | Attending: Gastroenterology

## 2013-10-12 ENCOUNTER — Ambulatory Visit (HOSPITAL_COMMUNITY)
Admission: RE | Admit: 2013-10-12 | Discharge: 2013-10-12 | Disposition: A | Discharge status: Home / Self Care | Payer: 59 | Source: Ambulatory Visit | Attending: Gastroenterology | Admitting: Gastroenterology

## 2013-10-12 ENCOUNTER — Encounter (HOSPITAL_COMMUNITY): Payer: Self-pay | Admitting: *Deleted

## 2013-10-12 DIAGNOSIS — Z9089 Acquired absence of other organs: Secondary | ICD-10-CM | POA: Insufficient documentation

## 2013-10-12 DIAGNOSIS — K869 Disease of pancreas, unspecified: Secondary | ICD-10-CM

## 2013-10-12 DIAGNOSIS — K828 Other specified diseases of gallbladder: Secondary | ICD-10-CM | POA: Insufficient documentation

## 2013-10-12 DIAGNOSIS — C250 Malignant neoplasm of head of pancreas: Secondary | ICD-10-CM | POA: Insufficient documentation

## 2013-10-12 DIAGNOSIS — I10 Essential (primary) hypertension: Secondary | ICD-10-CM | POA: Insufficient documentation

## 2013-10-12 DIAGNOSIS — K219 Gastro-esophageal reflux disease without esophagitis: Secondary | ICD-10-CM | POA: Insufficient documentation

## 2013-10-12 DIAGNOSIS — K7689 Other specified diseases of liver: Secondary | ICD-10-CM | POA: Insufficient documentation

## 2013-10-12 DIAGNOSIS — K8689 Other specified diseases of pancreas: Secondary | ICD-10-CM

## 2013-10-12 HISTORY — PX: EUS: SHX5427

## 2013-10-12 HISTORY — PX: PANCREAS BIOPSY: SHX1018

## 2013-10-12 SURGERY — UPPER ENDOSCOPIC ULTRASOUND (EUS) LINEAR
Anesthesia: Monitor Anesthesia Care

## 2013-10-12 MED ORDER — MIDAZOLAM HCL 5 MG/5ML IJ SOLN
INTRAMUSCULAR | Status: DC | PRN
  Administered 2013-10-12 (×2): 1 mg via INTRAVENOUS

## 2013-10-12 MED ORDER — LABETALOL HCL 5 MG/ML IV SOLN
INTRAVENOUS | Status: DC | PRN
  Administered 2013-10-12 (×2): 5 mg via INTRAVENOUS

## 2013-10-12 MED ORDER — ONDANSETRON HCL 4 MG/2ML IJ SOLN
INTRAMUSCULAR | Status: DC | PRN
  Administered 2013-10-12 (×2): 2 mg via INTRAVENOUS

## 2013-10-12 MED ORDER — PROPOFOL INFUSION 10 MG/ML OPTIME
INTRAVENOUS | Status: DC | PRN
  Administered 2013-10-12: 100 ug/kg/min via INTRAVENOUS

## 2013-10-12 MED ORDER — LACTATED RINGERS IV SOLN
INTRAVENOUS | Status: DC | PRN
  Administered 2013-10-12: 11:00:00 via INTRAVENOUS

## 2013-10-12 MED ORDER — SODIUM CHLORIDE 0.9 % IV SOLN: INTRAVENOUS | Status: DC

## 2013-10-12 MED ORDER — KETAMINE HCL 10 MG/ML IJ SOLN
INTRAMUSCULAR | Status: DC | PRN
  Administered 2013-10-12 (×2): 10 mg via INTRAVENOUS

## 2013-10-12 NOTE — Interval H&P Note (Signed)
History and Physical Interval Note:  10/12/2013 10:28 AM  Frances [name]  has presented today for surgery, with the diagnosis of Pancreatic mass [577.9]  The various methods of treatment have been discussed with the patient and family. After consideration of risks, benefits and other options for treatment, the patient has consented to  Procedure(s): UPPER ENDOSCOPIC ULTRASOUND (EUS) LINEAR (N/A) as a surgical intervention .  The patient's history has been reviewed, patient examined, no change in status, stable for surgery.  I have reviewed the patient's chart and labs.  Questions were answered to the patient's satisfaction.     Rachael Fee

## 2013-10-12 NOTE — Op Note (Signed)
[facility] [address] [place], [address]   ENDOSCOPIC ULTRASOUND PROCEDURE REPORT  PATIENT: Frances [name], Frances [name]  MR#: [id number] BIRTHDATE: 05-17-1966  GENDER: Female ENDOSCOPIST: Rachael Fee, MD REFERRED BY:  Claud Kelp, M.D. PROCEDURE DATE:  10/12/2013 PROCEDURE:   Upper EUS w/FNA ASA CLASS:      Class II INDICATIONS:   abdominal pain, weight loss; recent CT scan shows large mass in head of pancreas (normal LFTs, normal CA 19-9, CEA is elevated). MEDICATIONS: MAC sedation, administered by CRNA  DESCRIPTION OF PROCEDURE:   After the risks benefits and alternatives of the procedure were  explained, informed consent was obtained. The patient was then placed in the left, lateral, decubitus postion and IV sedation was administered. Throughout the procedure, the patients blood pressure, pulse and oxygen saturations were monitored continuously.  Under direct visualization, the Pentax Linear [id number]  endoscope was introduced through the mouth  and advanced to the second portion of the duodenum .  Water was used as necessary to provide an acoustic interface.  Upon completion of the imaging, water was removed and the patient was sent to the recovery room in satisfactory condition.  Endoscopic findings: 1. The posterior wall of stomach was somewhat edematous but no masses or clear inflammation. 2. UGI tract otherwise normal  EUS findings: 1. Hypoechoic, heterogeneous mass in head of pancreas measuring 4.3cm by 4.6cm. The mass directly abuts the SMA and encases the SMV/portal vein confluence.  The mass is causing main pancreatic duct obstruction (PD dilated to 7mm in body), but the CBD is non-dilated. The mass was sampled with 3 transduodenal passes (1 with 19 gauge, 3 with 25 gauge FNA needle). 2. There were several (3-4) scattered peripancreatic lymphnodes (6-11mm each). 3. Gallbladder surgically absent. 4. Limited views of liver, spleen were  normal  Impression: 4.3cm by 4.6cm that directly abuts SMA and encases the SMV/PV confluence.  There were several small peripancreatic lymphnodes. 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 This does not appear currently resectable.  My office will set up referrals to [facility], radiation oncology and will plan to present her case at this weeks GI multidisciplinary conference.   _______________________________ eSigned:  Rachael Fee, MD 10/12/2013 12:38 PM

## 2013-10-12 NOTE — H&P (View-Only) (Signed)
  Frances [name]   MRN:  [id number]   Description: 47 year old female  Provider: Cornisha Zetino M Shondell Fabel, MD  Department: [facility]        Diagnoses    Biliary dyskinesia    -  Primary    575.8    GERD (gastroesophageal reflux disease)        530.81    Hypertension        401.9    History of cesarean section        V45.89    History of gastritis        V12.79      Reason for Visit    Cholelithiasis        Current Vitals   BP Pulse Temp(Src) Resp Ht Wt    138/90 84 97.1 F (36.2 C) (Oral) 24 5' 4" (1.626 m) 222 lb (100.699 kg)       BMI - 38.09 kg/m2                    History and Physical   Frances [name] M Frances Anfinson, MD     Status: Signed                   HPI Frances [name] is a 47 y.o. female.  She is referred by Dr. Hurrelbrink in [place] for evaluation of biliary dyskinesia. Dr. Escajeda is her PCP in [place].   She has no real chronic GI problems other than reflux. She doesn't really have much in the way of heartburn but she does have waterbrash. She could not tolerated AcipHex, but has been on omeprazole for 30 days.   Since July, she has been having intermittent episodes of epigastric pain radiating to her back. She gets cramps. She feels bloated after meals. She has mild nausea but no vomiting. This is now happening daily. Bowel movements are not really changing. She had an ultrasound on August 9 which shows a small right hepatic cyst. Gallbladder looked normal. She had an upper endoscopy on 08/13/2013. I have that report. This shows gastritis and duodenitis. Biopsies showed mild inflammation. The esophagus looked normal. H. Pylori was normal. Hepatobiliary scan with ejection fraction was performed on September 29. Ejection fraction was low at 15%, normal being greater than 33%. Cystic duct patent. She says she had some cramping after drinking the Ensure. She is in no distress today.   Past history reveals the GERD, hypertension, cesarean section  2005. Her grandmother had a cholecystectomy but no other family members. She is married with one child he works for [employer]. Denies tobacco. Drinks wine occasionally.        Past Medical History   Diagnosis  Date   .  Hypertension     .  Herpes     .  Seasonal allergies           Past Surgical History   Procedure  Laterality  Date   .  Cesarean section            No family history on file.   Social History History   Substance Use Topics   .  Smoking status:  Never Smoker    .  Smokeless tobacco:  Not on file   .  Alcohol Use:  No        No Known Allergies    Current Outpatient Prescriptions   Medication  Sig  Dispense  Refill   .  dicyclomine (BENTYL)   10 MG capsule  Take 20 mg by mouth every 6 (six) hours.         .  HYDROCHLOROTHIAZIDE PO  Take by mouth daily.          .  omeprazole (PRILOSEC) 40 MG capsule  Take 40 mg by mouth daily.         .  promethazine (PHENERGAN) 12.5 MG tablet  Take 25 mg by mouth every 6 (six) hours as needed for nausea.       .        Review of Systems   Constitutional: Negative for fever, chills and unexpected weight change.  HENT: Negative for congestion, hearing loss, sore throat, trouble swallowing and voice change.   Eyes: Negative for visual disturbance.  Respiratory: Negative for cough and wheezing.   Cardiovascular: Negative for chest pain, palpitations and leg swelling.  Gastrointestinal: Positive for nausea, abdominal pain and abdominal distention. Negative for vomiting, diarrhea, constipation, blood in stool and anal bleeding.  Genitourinary: Negative for hematuria, vaginal bleeding and difficulty urinating.  Musculoskeletal: Positive for back pain. Negative for arthralgias.  Skin: Negative for rash and wound.  Neurological: Negative for seizures, syncope and headaches.  Hematological: Negative for adenopathy. Does not bruise/bleed easily.  Psychiatric/Behavioral: Negative for confusion.       Blood pressure 138/90, pulse 84, temperature 97.1 F (36.2 C), temperature source Oral, resp. rate 24, height 5' 4" (1.626 m), weight 222 lb (100.699 kg).   Physical Exam   Constitutional: She is oriented to person, place, and time. She appears well-developed and well-nourished. No distress.  BMI 37.93  HENT:   Head: Normocephalic and atraumatic.   Nose: Nose normal.   Mouth/Throat: No oropharyngeal exudate.  Eyes: Conjunctivae and EOM are normal. Pupils are equal, round, and reactive to light. Left eye exhibits no discharge. No scleral icterus.  Neck: Neck supple. No JVD present. No tracheal deviation present. No thyromegaly present.  Cardiovascular: Normal rate, regular rhythm, normal heart sounds and intact distal pulses.    No murmur heard. Pulmonary/Chest: Effort normal and breath sounds normal. No respiratory distress. She has no wheezes. She has no rales. She exhibits no tenderness.  Abdominal: Soft. Bowel sounds are normal. She exhibits no distension and no mass. There is no tenderness. There is no rebound and no guarding.  Well healed Pfannenstiel incision.  Musculoskeletal: She exhibits no edema and no tenderness.  Lymphadenopathy:    She has no cervical adenopathy.  Neurological: She is alert and oriented to person, place, and time. She exhibits normal muscle tone. Coordination normal.  Skin: Skin is warm. No rash noted. She is not diaphoretic. No erythema. No pallor.  Psychiatric: She has a normal mood and affect. Her behavior is normal. Judgment and thought content normal.      Data Reviewed Lots of note from her physicians, including gastroenterologist. Endoscopy report. Imaging studies. Laboratory work. Labs were not abnormal.   Assessment    History and symptom complex, coupled with workup to date show a suggestion acalculous cholecystitis or biliary dyskinesia. I discussed this with her in great detail. I offered her the option of elective cholecystectomy  and she would like to do that.   GERD. I told her this was independent and she might continue to have waterbrash and might require further medical management.   Hypertension   History cesarean section   Obesity, BMI of 37.9      Plan    She will be scheduled for elective laparoscopic cholecystectomy with   cholangiogram, possible open   I discussed the indications, details, techniques, and numerous risks of this surgery with her. She's aware of the risk of bleeding, infection, conversion to open laparotomy, bile leak, wound hernia, injury to adjacent organs with  major reconstructive surgery. She is aware that there is a chance that the symptoms may not resolve  following cholecystectomy she may require more extensive GI evaluation. We talked about gastroparesis and I thought she was relatively low risk for that disorder. She understands all these issues. her questions were answered. She agrees with this plan.           Mead Slane M. Thaddus Mcdowell, M.D., FACS [facility] General and Minimally invasive Surgery Breast and Colorectal Surgery Office:   [phone] Pager:   [phone]          

## 2013-10-12 NOTE — Anesthesia Postprocedure Evaluation (Signed)
  Anesthesia Post-op Note  Patient: Frances [name]  Procedure(s) Performed: Procedure(s) (LRB): UPPER ENDOSCOPIC ULTRASOUND (EUS) LINEAR (N/A)  Patient Location: PACU  Anesthesia Type: MAC  Level of Consciousness: awake and alert   Airway and Oxygen Therapy: Patient Spontanous Breathing  Post-op Pain: mild  Post-op Assessment: Post-op Vital signs reviewed, Patient's Cardiovascular Status Stable, Respiratory Function Stable, Patent Airway and No signs of Nausea or vomiting  Last Vitals:  Filed Vitals:   10/12/13 1240  BP: 147/99  Pulse: 90  Temp:   Resp: 25    Post-op Vital Signs: stable   Complications: No apparent anesthesia complications

## 2013-10-12 NOTE — Anesthesia Preprocedure Evaluation (Addendum)
Anesthesia Evaluation  Patient identified by MRN, date of birth, ID band Patient awake    Reviewed: Allergy & Precautions, H&P , NPO status , Patient's Chart, lab work & pertinent test results  Airway Mallampati: II TM Distance: >3 FB Neck ROM: full    Dental no notable dental hx. (+) Teeth Intact and Dental Advisory Given   Pulmonary neg pulmonary ROS,  breath sounds clear to auscultation  Pulmonary exam normal       Cardiovascular Exercise Tolerance: Good hypertension, Pt. on medications Rhythm:regular Rate:Normal  LVH   Neuro/Psych negative neurological ROS  negative psych ROS   GI/Hepatic negative GI ROS, Neg liver ROS, GERD-  Medicated and Controlled,Pancreatic mass   Endo/Other  negative endocrine ROS  Renal/GU negative Renal ROS  negative genitourinary   Musculoskeletal   Abdominal   Peds  Hematology negative hematology ROS (+)   Anesthesia Other Findings   Reproductive/Obstetrics negative OB ROS                          Anesthesia Physical Anesthesia Plan  ASA: III  Anesthesia Plan: MAC   Post-op Pain Management:    Induction:   Airway Management Planned: Simple Face Mask  Additional Equipment:   Intra-op Plan:   Post-operative Plan:   Informed Consent: I have reviewed the patients History and Physical, chart, labs and discussed the procedure including the risks, benefits and alternatives for the proposed anesthesia with the patient or authorized representative who has indicated his/her understanding and acceptance.   Dental Advisory Given  Plan Discussed with: CRNA and Surgeon  Anesthesia Plan Comments:         Anesthesia Quick Evaluation

## 2013-10-12 NOTE — Transfer of Care (Signed)
Immediate Anesthesia Transfer of Care Note  Patient: Frances [name]  Procedure(s) Performed: Procedure(s): UPPER ENDOSCOPIC ULTRASOUND (EUS) LINEAR (N/A)  Patient Location: PACU  Anesthesia Type:MAC  Level of Consciousness: awake, alert  and oriented  Airway & Oxygen Therapy: Patient Spontanous Breathing and Patient connected to face mask oxygen  Post-op Assessment: Report given to PACU RN and Post -op Vital signs reviewed and stable  Post vital signs: Reviewed and stable  Complications: No apparent anesthesia complications

## 2013-10-13 ENCOUNTER — Encounter (HOSPITAL_COMMUNITY): Payer: Self-pay | Admitting: Gastroenterology

## 2013-10-13 ENCOUNTER — Telehealth (INDEPENDENT_AMBULATORY_CARE_PROVIDER_SITE_OTHER): Payer: Self-pay

## 2013-10-13 DIAGNOSIS — K219 Gastro-esophageal reflux disease without esophagitis: Secondary | ICD-10-CM

## 2013-10-13 DIAGNOSIS — Z9049 Acquired absence of other specified parts of digestive tract: Secondary | ICD-10-CM

## 2013-10-13 MED ORDER — METOCLOPRAMIDE HCL 5 MG PO TABS: 5.0000 mg | ORAL_TABLET | Freq: Three times a day (TID) | ORAL | Status: DC

## 2013-10-13 NOTE — Addendum Note (Signed)
Addended byIvory Broad on: 10/13/2013 03:35 PM   Modules accepted: Orders

## 2013-10-13 NOTE — Telephone Encounter (Signed)
If she continues to have reflux symptoms, call her and a prescription for metoclopramide 5 mg tablets(#50-2 refills), 1 tablet 30 minutes before meals and at bedtime. This may make her a little bit drowsy.  Tell her that Dr. Christella Hartigan send [place] the message about the endoscopy, and he will discuss the findings with her.  hmi

## 2013-10-13 NOTE — Telephone Encounter (Signed)
I called the pt and relayed the message from Dr Derrell Lolling.  She wants to have the Reglan called in to [facility] on [address] in [place].  I will call that in.  I told her Dr Derrell Lolling got a message about the endoscopy and Dr Christella Hartigan should discuss results with her.  I asked her to call if the medicine doesn't help or if she has any further problems.

## 2013-10-13 NOTE — Telephone Encounter (Signed)
Pt calling stating she is having continued reflux symptoms. She is taking Prilosec in the AM. Pt does not have nausea but does c/o increased feeling of hunger, gastric sounds and slight cramping in stomach. Pt wanted to know if there is any other medication she can add to the Prilosec. She states tums do not help these symptoms. Pt is also asking if we have received her EGD result. I advised her this request will be sent to Dr Derrell Lolling and his nurse. Pt can be reached at [phone] or [phone].

## 2013-10-14 ENCOUNTER — Telehealth: Payer: Self-pay

## 2013-10-14 ENCOUNTER — Telehealth (INDEPENDENT_AMBULATORY_CARE_PROVIDER_SITE_OTHER): Payer: Self-pay

## 2013-10-14 DIAGNOSIS — C259 Malignant neoplasm of pancreas, unspecified: Secondary | ICD-10-CM

## 2013-10-14 NOTE — Telephone Encounter (Signed)
Message copied by Donata Duff on Wed Oct 14, 2013  8:23 AM ------      Message from: Rob Bunting P      Created: Wed Oct 14, 2013  7:31 AM       Alexia Freestone,      Not sure if I sent this on Monday but she needs referral to medical and radiation oncology for newly diagnosed pancreatic adenocarcinoma.            Thanks       ------

## 2013-10-14 NOTE — Telephone Encounter (Signed)
Pt calling for clarification of dosage directions on her Reglan Rx written by Dr. Derrell Lolling.  Confirmed with the patient she is to take 1 pill qid before meals at at bedtime.  Pt understood these instructions.

## 2013-10-15 ENCOUNTER — Other Ambulatory Visit: Payer: Self-pay

## 2013-10-15 ENCOUNTER — Telehealth: Payer: Self-pay

## 2013-10-15 DIAGNOSIS — C259 Malignant neoplasm of pancreas, unspecified: Secondary | ICD-10-CM

## 2013-10-15 NOTE — Telephone Encounter (Signed)
ERCP scheduled, pt instructed and medications reviewed.  Patient instructions mailed to home.  Patient to call with any questions or concerns.  

## 2013-10-16 ENCOUNTER — Telehealth: Payer: Self-pay | Admitting: *Deleted

## 2013-10-16 ENCOUNTER — Telehealth: Payer: Self-pay | Admitting: Oncology

## 2013-10-16 NOTE — Telephone Encounter (Signed)
Spoke with patient by phone and confirmed appointments with Dr. Truett Perna and Dr. Dayton Scrape for 10/20/13.  Explained role of nurse navigator.  Directions, contact names, and phone numbers were provided.  Patient was without questions.

## 2013-10-16 NOTE — Telephone Encounter (Signed)
Added new pt appt for 11/11 @ 8am FC/BS. D/t per Burnetta Sabin to call pt.

## 2013-10-19 ENCOUNTER — Encounter: Payer: Self-pay | Admitting: Radiation Oncology

## 2013-10-19 DIAGNOSIS — C250 Malignant neoplasm of head of pancreas: Secondary | ICD-10-CM | POA: Insufficient documentation

## 2013-10-19 NOTE — Progress Notes (Signed)
GI Location of Tumor / Histology:  Pancreas head  Patient presented 2 months ago with symptoms of: heartburn, abdominal pain, bloating epigastric discomfort after eating that transitions to cramping, nausea  Biopsies of pancreas head revealed:  10/12/13 FINE NEEDLE ASPIRATION, ENDOSCOPIC, PANCREAS HEAD(SPECIMEN 1 OF 1 COLLECTED 10/12/13): MALIGNANT CELLS CONSISTENT WITH ADENOCARCINOMA.  Past/Anticipated interventions by surgeon, if any: lap choley on 10/02/13, Dr Derrell Lolling  Past/Anticipated interventions by medical oncology, if any: new consult w/Dr Truett Perna on 10/20/13, 8 am  Weight changes, if any: lost 14 lbs in past 4 months  Bowel/Bladder complaints, if any: none  Nausea / Vomiting, if any: none at this time  Pain issues, if any:  Lower abdomen cramping, aching that radiates to her lower back at times. Dilaudid prn prescribed today per Dr Truett Perna.  SAFETY ISSUES:  Prior radiation? no  Pacemaker/ICD? no  Possible current pregnancy? Uses IUD, serum pregnancy test on 09/29/13 negative  Is the patient on methotrexate? no  Current Complaints / other details:  Married, 1 son, has worked for [employer] x 20 years. States her appetite is improving.

## 2013-10-20 ENCOUNTER — Telehealth: Payer: Self-pay | Admitting: *Deleted

## 2013-10-20 ENCOUNTER — Encounter: Payer: Self-pay | Admitting: Radiation Oncology

## 2013-10-20 ENCOUNTER — Other Ambulatory Visit (INDEPENDENT_AMBULATORY_CARE_PROVIDER_SITE_OTHER): Payer: Self-pay | Admitting: General Surgery

## 2013-10-20 ENCOUNTER — Ambulatory Visit
Admission: RE | Admit: 2013-10-20 | Discharge: 2013-10-20 | Disposition: A | Discharge status: Home / Self Care | Payer: 59 | Source: Ambulatory Visit | Attending: Radiation Oncology | Admitting: Radiation Oncology

## 2013-10-20 ENCOUNTER — Encounter: Payer: Self-pay | Admitting: Oncology

## 2013-10-20 ENCOUNTER — Ambulatory Visit: Payer: 59

## 2013-10-20 ENCOUNTER — Other Ambulatory Visit: Payer: Self-pay | Admitting: *Deleted

## 2013-10-20 ENCOUNTER — Ambulatory Visit (HOSPITAL_BASED_OUTPATIENT_CLINIC_OR_DEPARTMENT_OTHER): Payer: 59 | Admitting: Oncology

## 2013-10-20 ENCOUNTER — Ambulatory Visit: Payer: Self-pay

## 2013-10-20 ENCOUNTER — Telehealth: Payer: Self-pay | Admitting: Oncology

## 2013-10-20 VITALS — BP 156/102 | HR 121 | Temp 99.2°F | Resp 20 | Ht 65.0 in | Wt 216.0 lb

## 2013-10-20 VITALS — BP 164/98 | HR 120 | Temp 98.0°F | Resp 20 | Ht 65.0 in | Wt 215.0 lb

## 2013-10-20 DIAGNOSIS — C259 Malignant neoplasm of pancreas, unspecified: Secondary | ICD-10-CM

## 2013-10-20 DIAGNOSIS — Z79899 Other long term (current) drug therapy: Secondary | ICD-10-CM | POA: Insufficient documentation

## 2013-10-20 DIAGNOSIS — G893 Neoplasm related pain (acute) (chronic): Secondary | ICD-10-CM

## 2013-10-20 DIAGNOSIS — I10 Essential (primary) hypertension: Secondary | ICD-10-CM

## 2013-10-20 DIAGNOSIS — K219 Gastro-esophageal reflux disease without esophagitis: Secondary | ICD-10-CM | POA: Insufficient documentation

## 2013-10-20 DIAGNOSIS — C250 Malignant neoplasm of head of pancreas: Secondary | ICD-10-CM

## 2013-10-20 MED ORDER — LIDOCAINE-PRILOCAINE 2.5-2.5 % EX CREA: 1.0000 "application " | TOPICAL_CREAM | CUTANEOUS | Status: DC | PRN

## 2013-10-20 MED ORDER — PROMETHAZINE HCL 25 MG PO TABS: 25.0000 mg | ORAL_TABLET | Freq: Four times a day (QID) | ORAL | Status: DC | PRN

## 2013-10-20 MED ORDER — HYDROMORPHONE HCL 2 MG PO TABS: 2.0000 mg | ORAL_TABLET | ORAL | Status: DC | PRN

## 2013-10-20 NOTE — Progress Notes (Signed)
Put husband's fmla form on nurse's desk. °

## 2013-10-20 NOTE — Progress Notes (Signed)
[name] Radiation Oncology NEW PATIENT EVALUATION  Name: Frances [name] MRN: [id number]  Date:   10/20/2013           DOB: 05/23/1966  Status: outpatient   CC: Frances [name], MD  Frances Lint, MD , Dr. 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 female who is seen today for the courtesy of Dr. Donell [name] and Dr. Derrell [name] for evaluation of her apparent locally advanced carcinoma of the pancreas. She was initially evaluated in [facility] and felt to have biliary dyskinesia. She has been having intermittent episodes of epigastric pain radiating to her right flank since this past July. She has early satiety and weight loss of approximately 40 pounds over the past 4 months. She was eventually seen by Dr. Derrell [name] for consideration of cholecystectomy. At the time of laparoscopic cholecystectomy the small bowel root of the mesentery looked "quite full" but without signs of malignancy. A CT scan of the abdomen 10/02/2013 showed a large ill-defined pancreatic mass with pancreatic ductal dilatation with venous occlusion and partial arterial encasement, highly suspicious for malignancy. The pancreatic head mass measured 4.7 x 5.0 cm. There was nonspecific sclerosis of the T11 and L1 vertebral bodies, probably incidental. There were felt to be multiple prominent lymph nodes within the base of the mesentery, somewhat unusual for pancreatic cancer. The possibility of lymphoma was felt to be a consideration. On October 23 Dr. Wendall [name] performed endoscopic ultrasound with fine-needle aspiration biopsy of the pancreatic mass which shows malignant cells consistent with adenocarcinoma. The pancreatic head mass measured 4.3 x 4.6 cm. The mass directly abutted the SMA and encases the SMV/portal vein confluence. She was seen by Dr. Truett [name] earlier  today.  PREVIOUS RADIATION THERAPY: No   PAST MEDICAL HISTORY:  has a past medical history of Hypertension; Herpes (09/29/13); Seasonal allergies; GERD (gastroesophageal reflux disease); and Pancreatic cancer (10/12/13).     PAST SURGICAL HISTORY:  Past Surgical History  Procedure Laterality Date  . Cesarean section  2005  . Cholecystectomy N/A 10/02/2013    Procedure: LAPAROSCOPIC CHOLECYSTECTOMY WITH INTRAOPERATIVE CHOLANGIOGRAM POSSIBLE OPEN ;  Surgeon: Frances Mention, MD;  Location: [facility] ORS;  Service: General;  Laterality: N/A;  . Eus N/A 10/12/2013    Procedure: UPPER ENDOSCOPIC ULTRASOUND (EUS) LINEAR;  Surgeon: Frances Fee, MD;  Location: [facility] ENDOSCOPY;  Service: Endoscopy;  Laterality: N/A;  . Pancreas biopsy  10/12/13    FNA- pancreas head: malignant cells consistent w/adenocarcinoma     FAMILY HISTORY: family history includes Diabetes in her maternal grandfather; Hypertension in her mother. her mother and father are 56 and 57 respectively, both alive and well. No family history of cancer.   SOCIAL HISTORY:  reports that she has never smoked. She does not have any smokeless tobacco history on file. She reports that she does not drink alcohol or use illicit drugs. Married, 56-year-old son. She works as a Programmer, systems.   ALLERGIES: Review of patient's allergies indicates no known allergies.   MEDICATIONS:  Current Outpatient Prescriptions  Medication Sig Dispense Refill  . acetaminophen (TYLENOL) 500 MG tablet Take 1,000 mg by mouth every 6 (six) hours as needed for pain.      . cyclobenzaprine (FLEXERIL) 10 MG tablet Take 10 mg by mouth at bedtime. As needed      .  hydrochlorothiazide (HYDRODIURIL) 25 MG tablet Take 12.5 mg by mouth every evening.      Marland Kitchen HYDROmorphone (DILAUDID) 2 MG tablet Take 1-2 tablets (2-4 mg total) by mouth every 4 (four) hours as needed for severe pain.  60 tablet  0  . lidocaine-prilocaine (EMLA) cream Apply 1 application  topically as needed. Apply to Frances [name] site 1-2 hours prior to stick and cover with plastic wrap  30 g  prn  . metoCLOPramide (REGLAN) 5 MG tablet Take 1 tablet (5 mg total) by mouth 4 (four) times daily -  before meals and at bedtime. 30 minutes before meals and at bedtime  50 tablet  2  . omeprazole (PRILOSEC) 40 MG capsule Take 40 mg by mouth daily.      . promethazine (PHENERGAN) 25 MG tablet Take 1 tablet (25 mg total) by mouth every 6 (six) hours as needed for nausea or vomiting.  60 tablet  2   No current facility-administered medications for this encounter.     REVIEW OF SYSTEMS:  Pertinent items are noted in HPI.    PHYSICAL EXAM:  height is 5\' 5"  (1.651 m) and weight is 216 lb (97.977 kg). Her oral temperature is 99.2 F (37.3 C). Her blood pressure is 156/102 and her pulse is 121. Her respiration is 20.   Alert and oriented 47 year old after American female appearing her stated age. Head and neck examination: Grossly unremarkable. Nodes: Without palpable cervical or supraclavicular lymphadenopathy. Chest: Lungs clear. Heart: Regular rate and rhythm. Back: Without spinal or CVA tenderness. Abdomen: Soft, without masses organomegaly. There is slight discomfort on deep palpation along the epigastric region. Extremities: Without edema. Neurologic examination: Grossly nonfocal.   LABORATORY DATA:  Lab Results  Component Value Date   WBC 7.7 10/03/2013   HGB 11.9* 10/03/2013   HCT 35.9* 10/03/2013   MCV 80.0 10/03/2013   PLT 210 10/03/2013   Lab Results  Component Value Date   NA 137 10/03/2013   K 4.0 10/03/2013   CL 102 10/03/2013   CO2 26 10/03/2013   Lab Results  Component Value Date   ALT 76* 10/03/2013   AST 87* 10/03/2013   ALKPHOS 101 10/03/2013   BILITOT 0.3 10/03/2013      IMPRESSION: Clinical stage T3 NX MX adenocarcinoma of the head of the pancreas. Based on her CT scan and EUS findings showing abutment of the superior mesenteric artery, she appears to be  currently unresectable. Management options include preoperative chemoradiation or preoperative chemotherapy. I discussed her management with Dr. Truett [name] and he feels that it would be reasonable to consider chemotherapy alone in this setting, perhaps add preoperative or postoperative radiation therapy depending on her response to initial chemotherapy. It is hoped that she can become surgically resectable. She will be evaluated further by Dr. Donell [name]. We briefly discussed the potential acute and late toxicities of radiation therapy should it be clinically indicated.    PLAN: As discussed above. I spent 45 minutes minutes face to face with the patient and more than 50% of that time was spent in counseling and/or coordination of care.

## 2013-10-20 NOTE — Progress Notes (Signed)
[name] New Patient Consult   Referring MD: Claud Kelp   Frances [name] 47 y.o.  1966/05/17    Reason for Referral: Pancreas cancer     HPI: She reports a history of "indigestion "beginning in July of 2014. She was evaluated in [place]. An upper endoscopy on 08/13/2013. A few antral erosions were seen, but no ulcers. The first and second portion of the duodenum appeared normal. Gastric and small bowel biopsies were obtained. The pathology revealed a mild chronic duodenitis and gastritis. No evidence of malignancy. H. pylori stains were negative.  She was placed on AcipHex and feels this caused nausea. Additional evaluation included an abdominal ultrasound was unremarkable. A hepatobiliary scan on 09/13/2013 revealed a low gallbladder ejection fraction.  She was referred to Dr. Derrell Lolling and underwent a cholecystectomy on 10/02/2013. The pathology from the gallbladder revealed chronic cholecystitis. A retroperitoneal fullness was noted to displace the stomach anteriorly. Upon lifting up a transverse colon the small bowel root of mesentery appeared "full ", but there was no sign of neoplasia.  She was referred for a CT of the abdomen and pelvis after surgery. An ill-defined pancreatic head mass measured 4.7 x 5 cm with associated pancreatic ductal dilatation. No significant biliary dilatation. Numerous mild to moderately enlarged lymph nodes were seen at the base of the mesentery. No significant adenopathy within the porta hepatis. 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 The mass was noted to abut the superior mesenteric artery although the mass did not fully in case ordinarily artery. Several low-density hepatic lesions were felt to be cysts.  She was referred to Dr. Christella Hartigan and underwent an endoscopic ultrasound procedure on 10/12/2013. A hypoechoic mass was noted at the head of the pancreas. The mass was  noted to directly abut the superior mesenteric artery and encased the superior mesenteric vein/portal vein confluence. The mass causes pancreatic duct obstruction. The common bile duct was not dilated. The mass was biopsied. There were several scattered peripancreatic lymph nodes measuring 6-8 mm each. The cytology ([id number]) confirmed malignant cells consistent with adenocarcinoma.  She reports discomfort at the right shoulder and reflux symptoms have improved following the cholecystectomy. She continues to have a "nagging" abdominal pain.    Past Medical History  Diagnosis Date  . Hypertension   . 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 Seasonal allergies   . GERD (gastroesophageal reflux disease)   . Pancreatic cancer 10/12/13    malignant cells consistent w/adenocarcinoma    .    G1 P1  Past Surgical History  Procedure Laterality Date  . Cesarean section  2005  . Cholecystectomy N/A 10/02/2013    Procedure: LAPAROSCOPIC CHOLECYSTECTOMY WITH INTRAOPERATIVE CHOLANGIOGRAM POSSIBLE OPEN ;  Surgeon: [name], MD;  Location: [facility];  Service: General;  Laterality: N/A;  . Eus N/A 10/12/2013    Procedure: UPPER ENDOSCOPIC ULTRASOUND (EUS) LINEAR;  Surgeon: Rachael Fee, MD;  Location: [facility] END[facility];  Service: Endoscopy;  Laterality: N/A;  . Pancreas biopsy  10/12/13    FNA- pancreas head: malignant cells consistent w/adenocarcinoma    Family History  Problem Relation Age of Onset  . Hypertension Mother   . Diabetes Maternal Grandfather    no family history of cancer  Current outpatient prescriptions:acetaminophen (TYLENOL) 500 MG tablet, Take 1,000 mg by mouth every 6 (six) hours as needed for pain., Disp: , Rfl: ;  cyclobenzaprine (FLEXERIL) 10  MG tablet, Take 10 mg by mouth at bedtime. As needed, Disp: , Rfl: ;  hydrochlorothiazide (HYDRODIURIL) 25 MG tablet, Take 12.5 mg by mouth every evening., Disp: , Rfl:  HYDROmorphone (DILAUDID) 2 MG tablet, Take 1-2 tablets  (2-4 mg total) by mouth every 4 (four) hours as needed for severe pain., Disp: 60 tablet, Rfl: 0;  metoCLOPramide (REGLAN) 5 MG tablet, Take 1 tablet (5 mg total) by mouth 4 (four) times daily -  before meals and at bedtime. 30 minutes before meals and at bedtime, Disp: 50 tablet, Rfl: 2;  omeprazole (PRILOSEC) 40 MG capsule, Take 40 mg by mouth daily., Disp: , Rfl:  lidocaine-prilocaine (EMLA) cream, Apply 1 application topically as needed. Apply to [facility] site 1-2 hours prior to stick and cover with plastic wrap, Disp: 30 g, Rfl: prn;  promethazine (PHENERGAN) 25 MG tablet, Take 1 tablet (25 mg total) by mouth every 6 (six) hours as needed for nausea or vomiting., Disp: 60 tablet, Rfl: 2  Allergies: No Known Allergies  Social History: She works in an office occupation. She does not use tobacco. Rare alcohol use. No risk factor for HIV or hepatitis.    ROS:   Positives include: "Nagging "abdominal discomfort, mid back pain, 40 pound weight loss, pain after eating, a few episodes of emesis, reflux symptoms better after the cholecystectomy and with eating small meals  A complete ROS was otherwise negative.  Physical Exam:  Blood pressure 164/98, pulse 120, temperature 98 F (36.7 C), temperature source Oral, resp. rate 20, height 5\' 5"  (1.651 m), weight 215 lb (97.523 kg), last menstrual period 09/14/2013.  HEENT: Oropharynx without visible mass, neck without mass Lungs: Clear bilaterally Cardiac: Regular rate and rhythm Abdomen: No hepatomegaly, healed surgical incisions, fullness in the mid upper abdomen with mild associated tenderness, no apparent ascites, no splenomegaly  Vascular: No leg edema Lymph nodes: No cervical, supraclavicular, axillary, or inguinal nodes Neurologic: Alert and oriented, the motor exam appears intact in the upper and lower extremities Skin: No rash Musculoskeletal: No spine tenderness   LAB:  CBC  Lab Results  Component Value Date   WBC 7.7 10/03/2013    HGB 11.9* 10/03/2013   HCT 35.9* 10/03/2013   MCV 80.0 10/03/2013   PLT 210 10/03/2013     CMP      Component Value Date/Time   NA 137 10/03/2013 0410   K 4.0 10/03/2013 0410   CL 102 10/03/2013 0410   CO2 26 10/03/2013 0410   GLUCOSE 116* 10/03/2013 0410   BUN 9 10/03/2013 0410   CREATININE 0.78 10/03/2013 0410   CALCIUM 9.7 10/03/2013 0410   PROT 6.5 10/03/2013 0410   ALBUMIN 3.4* 10/03/2013 0410   AST 87* 10/03/2013 0410   ALT 76* 10/03/2013 0410   ALKPHOS 101 10/03/2013 0410   BILITOT 0.3 10/03/2013 0410   GFRNONAA >90 10/03/2013 0410   GFRAA >90 10/03/2013 0410   10/03/2013: CEA 20.4, CA 19-9  4.9  Radiology: As per history of present illness    Assessment/Plan:   1. 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 Status post a cholecystectomy 10/02/2013 with the pathology revealing chronic cholecystitis  3. Abdomen/back pain secondary to #1  4. Hypertension   Disposition:   Ms. Pippins has been diagnosed with locally advanced pancreas cancer. I discussed the diagnosis and treatment options with Ms. Mcpherson and her husband. Her case was presented at the GI tumor conference on 10/14/2013. Review of the imaging studies by the  surgeons present at the conference was consistent with unresectable disease.  She is scheduled to see Dr. Dayton Scrape later today. I discussed the case with Dr. Dayton Scrape.  I explained there is no clear "standard "approach to treatment in patients with locally advanced pancreas cancer. We discussed the increased clinical response rate associated with several of the newer multiagent chemotherapy regimens. We also discussed 5-fluorouracil chemotherapy and radiation. I recommend treatment with FOLFIRINOX.  The plan is to complete several cycles of FOLFIRINOX prior to a restaging evaluation. We will then reconsider the indication for surgical resection versus additional chemotherapy or Xeloda/radiation.  We reviewed the potential  toxicities associated with FOLFIRINOX including the chance for nausea/vomiting, mucositis, diarrhea, alopecia, and hematologic toxicity. We discussed the abdominal pain and diarrhea associated with irinotecan. We reviewed the allergic reaction and various types of neuropathy seen with oxaliplatin. We discussed the rash, hyperpigmentation, and hand/foot syndrome associated with 5-fluorouracil. She will attend a chemotherapy teaching class.  We will refer her to Dr. Derrell Lolling for placement of a Port-A-Cath. The plan is to begin a first cycle of FOLFIRINOX on 10/29/2013. We will obtain a baseline CEA prior to beginning chemotherapy. She will be referred for a staging chest CT.  Approximately 60 minutes were spent with the patient and her family today. The majority of the time was used for counseling and coordination of care.  Laruth Hanger 10/20/2013, 6:06 PM

## 2013-10-20 NOTE — Progress Notes (Deleted)
Confirmed with patient she has IUD for birth control. Stressed to her that she should not become pregnant while receiving chemotherapy. She understands and agrees.

## 2013-10-20 NOTE — Progress Notes (Signed)
Please see the Nurse Progress Note in the MD Initial Consult Encounter for this patient. 

## 2013-10-20 NOTE — Patient Instructions (Signed)
Hydromorphone tablets What is this medicine? HYDROMORPHONE (hye droe MOR fone) is a pain reliever. It is used to treat moderate to severe pain. This medicine may be used for other purposes; ask your health care provider or pharmacist if you have questions. COMMON BRAND NAME(S): Dilaudid What should I tell my health care provider before I take this medicine? They need to know if you have any of these conditions: -brain tumor -drug abuse or addiction -head injury -heart disease -frequently drink alcohol containing drinks -kidney disease or problems going to the bathroom -liver disease -lung disease, asthma, or breathing problems -mental problems -an allergic or unusual reaction to lactose, hydromorphone, other opioid analgesics, other medicines, sulfites, foods, dyes, or preservatives -pregnant or trying to get pregnant -breast-feeding How should I use this medicine? Take this medicine by mouth with a glass of water. If the medicine upsets your stomach, take it with food or milk. Follow the directions on the prescription label. Do not take more medicine than you are told to take. Talk to your pediatrician regarding the use of this medicine in children. Special care may be needed. Overdosage: If you think you have taken too much of this medicine contact a poison control center or emergency room at once. NOTE: This medicine is only for you. Do not share this medicine with others. What if I miss a dose? If you miss a dose, take it as soon as you can. If it is almost time for your next dose, take only that dose. Do not take double or extra doses. What may interact with this medicine? -alcohol -antihistamines for allergy, cough and cold -medicines for anesthesia -medicines for depression, anxiety, or psychotic disturbances -medicines for sleep -muscle relaxants -naltrexone -narcotic medicines (opiates) for pain -phenothiazines like chlorpromazine, mesoridazine, prochlorperazine,  thioridazine -tramadol This list may not describe all possible interactions. Give your health care provider a list of all the medicines, herbs, non-prescription drugs, or dietary supplements you use. Also tell them if you smoke, drink alcohol, or use illegal drugs. Some items may interact with your medicine. What should I watch for while using this medicine? Tell your doctor or health care professional if your pain does not go away, if it gets worse, or if you have new or a different type of pain. You may develop tolerance to the medicine. Tolerance means that you will need a higher dose of the medicine for pain relief. Tolerance is normal and is expected if you take this medicine for a long time. Do not suddenly stop taking your medicine because you may develop a severe reaction. Your body becomes used to the medicine. This does NOT mean you are addicted. Addiction is a behavior related to getting and using a drug for a non-medical reason. If you have pain, you have a medical reason to take pain medicine. Your doctor will tell you how much medicine to take. If your doctor wants you to stop the medicine, the dose will be slowly lowered over time to avoid any side effects. You may get drowsy or dizzy. Do not drive, use machinery, or do anything that needs mental alertness until you know how this medicine affects you. Do not stand or sit up quickly, especially if you are an older patient. This reduces the risk of dizzy or fainting spells. Alcohol may interfere with the effect of this medicine. Avoid alcoholic drinks. There are different types of narcotic medicines (opiates) for pain. If you take more than one type at the same time, you  may have more side effects. Give your health care provider a list of all medicines you use. Your doctor will tell you how much medicine to take. Do not take more medicine than directed. Call emergency for help if you have problems breathing. This medicine will cause  constipation. Try to have a bowel movement at least every 2 to 3 days. If you do not have a bowel movement for 3 days, call your doctor or health care professional. Your mouth may get dry. Chewing sugarless gum or sucking hard candy, and drinking plenty of water may help. Contact your doctor if the problem does not go away or is severe. What side effects may I notice from receiving this medicine? Side effects that you should report to your doctor or health care professional as soon as possible: -allergic reactions like skin rash, itching or hives, swelling of the face, lips, or tongue -breathing problems -changes in vision -confusion -feeling faint or lightheaded, falls -seizures -slow or fast heartbeat -trouble passing urine or change in the amount of urine -trouble with balance, talking, walking Side effects that usually do not require medical attention (report to your doctor or health care professional if they continue or are bothersome): -difficulty sleeping -drowsiness -dry mouth -flushing -headache -itching -loss of appetite -nausea, vomiting This list may not describe all possible side effects. Call your doctor for medical advice about side effects. You may report side effects to FDA at 1-800-FDA-1088. Where should I keep my medicine? Keep out of the reach of children. This medicine can be abused. Keep your medicine in a safe place to protect it from theft. Do not share this medicine with anyone. Selling or giving away this medicine is dangerous and against the law. Store at room temperature between 15 and 30 degrees C (59 and 86 degrees F). Keep container tightly closed. Protect from light. Discard unused medicine and used packaging carefully. Pets and children can be harmed if they find used or lost packages. Flush any unused medicines down the toilet. Do not use the medicine after the expiration date. NOTE: This sheet is a summary. It may not cover all possible information. If  you have questions about this medicine, talk to your doctor, pharmacist, or health care provider.  2014, Elsevier/Gold Standard. (2011-08-03 15:08:53)

## 2013-10-20 NOTE — Progress Notes (Deleted)
   [facility]    OFFICE PROGRESS NOTE   INTERVAL HISTORY:   ***  Objective:  Vital signs in last 24 hours:  Blood pressure 164/98, pulse 120, temperature 98 F (36.7 C), temperature source Oral, resp. rate 20, height 5\' 5"  (1.651 m), weight 215 lb (97.523 kg), last menstrual period 09/14/2013.    HEENT: *** Lymphatics: *** Resp: *** Cardio: *** GI: *** Vascular: *** Neuro:***  Skin:***   Portacath/PICC-without erythema  Lab Results:  Lab Results  Component Value Date   WBC 7.7 10/03/2013   HGB 11.9* 10/03/2013   HCT 35.9* 10/03/2013   MCV 80.0 10/03/2013   PLT 210 10/03/2013      Medications: I have reviewed the patient's current medications.  Assessment/Plan:  ***  Disposition:  Thornton Papas, MD  10/20/2013  5:57 PM

## 2013-10-20 NOTE — Telephone Encounter (Addendum)
Contacted pt per MD; instructed pt to try going without Reglan and use the Phenergan only for nausea.  Pt verbalized understanding from her pharmacy "was concerned with using Reglan and phenergan and that it may have side effect of cause involuntary movements"  Pt verbalized understanding that "I will just use the phenergan if I become nauseous."  Instructed pt to call office if any problems.  Pt verbalized understanding and expressed appreciation for call.    Left vm @ pharmacy letting them know we spoke to pt re: Reglan and Phenergan use and that pt will use phenergan if needed for nausea and go without the Reglan.

## 2013-10-20 NOTE — Telephone Encounter (Signed)
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 THIS RISK GREATLY INCREASES WITH THE USE OF PROMETHAZINE. PHARMACY WANTS TO BE SURE DR.SHERRILL IS AWARE OF THIS OCCURRENCE IN CASE ANY CHANGES NEED TO BE MADE AND SO IT CAN BE DOCUMENTED BY THE PHARMACY.

## 2013-10-20 NOTE — Telephone Encounter (Signed)
Gave pt appt for lab and MD, emailed Marcelino Duster regarding chemo on 11/20 and 12/4, called Dr, Derrell Lolling office , they will work her in, waiting for a call from [facility]

## 2013-10-20 NOTE — Progress Notes (Signed)
Met with Natalyn D Soberanes and family. Explained role of nurse navigator. Educational information provided on pancreatic cancer  Referral made to dietician for diet education. [facility] resources provided to patient, including SW service information.  Contact names and phone numbers were provided for entire [facility] team.  Teach back method was used.  No barriers to care identified.  Will continue to follow as needed.

## 2013-10-20 NOTE — Progress Notes (Signed)
Checked in new pt with no financial concerns. °

## 2013-10-21 ENCOUNTER — Encounter: Payer: Self-pay | Admitting: Oncology

## 2013-10-21 ENCOUNTER — Telehealth: Payer: Self-pay | Admitting: Oncology

## 2013-10-21 ENCOUNTER — Other Ambulatory Visit: Payer: Self-pay | Admitting: *Deleted

## 2013-10-21 ENCOUNTER — Encounter (HOSPITAL_COMMUNITY): Payer: Self-pay | Admitting: *Deleted

## 2013-10-21 ENCOUNTER — Telehealth: Payer: Self-pay

## 2013-10-21 ENCOUNTER — Telehealth: Payer: Self-pay | Admitting: *Deleted

## 2013-10-21 NOTE — Telephone Encounter (Signed)
Called pt and left message regarding CT for tomorrow @ [facility] imaging

## 2013-10-21 NOTE — Telephone Encounter (Signed)
Ok, let her know I'm happy to talk with her if she has question or concerns.

## 2013-10-21 NOTE — Telephone Encounter (Signed)
Per staff message and POF I have scheduled appts.  JMW  

## 2013-10-21 NOTE — Telephone Encounter (Signed)
Message copied by Donata Duff on Wed Oct 21, 2013 10:53 AM ------      Message from: Rob Bunting P      Created: Wed Oct 21, 2013 10:36 AM       Lylla Eifler,       Her ERCP date needs to be changes since it will interfere with Chemo plans. Can you check on MAC time next week (during hosp week) at [facility], tues, wed or Thursday would be best. ERCP with stent placement for pancreatic cancer.            Thanks       ------

## 2013-10-21 NOTE — Telephone Encounter (Signed)
Pt has been moved to 10/27/13 115 pm [name] is aware, pt is concerned that the procedure is happening to fast and she wants to talk it over with her husband she will call back if she wants to change the date

## 2013-10-21 NOTE — Progress Notes (Signed)
Faxed fmla form to [employer] @ [phone] °

## 2013-10-22 ENCOUNTER — Encounter (HOSPITAL_COMMUNITY): Payer: Self-pay | Admitting: Pharmacy Technician

## 2013-10-23 ENCOUNTER — Encounter (HOSPITAL_BASED_OUTPATIENT_CLINIC_OR_DEPARTMENT_OTHER): Payer: Self-pay | Admitting: *Deleted

## 2013-10-23 ENCOUNTER — Ambulatory Visit
Admission: RE | Admit: 2013-10-23 | Discharge: 2013-10-23 | Disposition: A | Discharge status: Home / Self Care | Payer: 59 | Source: Ambulatory Visit | Attending: Oncology | Admitting: Oncology

## 2013-10-23 ENCOUNTER — Telehealth (INDEPENDENT_AMBULATORY_CARE_PROVIDER_SITE_OTHER): Payer: Self-pay

## 2013-10-23 DIAGNOSIS — C259 Malignant neoplasm of pancreas, unspecified: Secondary | ICD-10-CM

## 2013-10-23 NOTE — Telephone Encounter (Signed)
Message copied by Ivory Broad on Fri Oct 23, 2013  3:52 PM ------      Message from: [name]      Created: Fri Oct 23, 2013 10:51 AM       Yes. I can just check abdomen and incisions at time of PAC placement.  No need for OV unless problems.      hmi      ----- Message -----         From: Ivory Broad, RN         Sent: 10/23/2013  10:08 AM           To: [name], MD            Vicie Mutters wants to know if Denzil needs to see you on 11/21 since she is getting her port and starting chemo?            I guess you can check her incisions when you see her preop on surgery day for the port.                  Huntley Dec       ------

## 2013-10-23 NOTE — Telephone Encounter (Signed)
I called the pt and let her know we can cancel her appointment for 11/21.  Dr Derrell Lolling will check her in the preop holding area when he does her port a cath insertion.  She confirmed her time and date for the surgery and she states on 11/18 Dr Christella Hartigan will put in stents.

## 2013-10-23 NOTE — Progress Notes (Signed)
No more labs needed-had labs 10/03/13-getting stent bile duct 10/27/13

## 2013-10-25 ENCOUNTER — Other Ambulatory Visit: Payer: Self-pay | Admitting: Oncology

## 2013-10-26 ENCOUNTER — Encounter (INDEPENDENT_AMBULATORY_CARE_PROVIDER_SITE_OTHER): Payer: Self-pay

## 2013-10-26 ENCOUNTER — Other Ambulatory Visit (HOSPITAL_BASED_OUTPATIENT_CLINIC_OR_DEPARTMENT_OTHER): Payer: 59

## 2013-10-26 ENCOUNTER — Other Ambulatory Visit: Payer: 59

## 2013-10-26 ENCOUNTER — Ambulatory Visit: Payer: 59 | Admitting: Nutrition

## 2013-10-26 DIAGNOSIS — C259 Malignant neoplasm of pancreas, unspecified: Secondary | ICD-10-CM

## 2013-10-26 DIAGNOSIS — C250 Malignant neoplasm of head of pancreas: Secondary | ICD-10-CM

## 2013-10-26 LAB — CBC WITH DIFFERENTIAL/PLATELET
BASO%: 1 % (ref 0.0–2.0)
EOS%: 1.8 % (ref 0.0–7.0)
HCT: 38.2 % (ref 34.8–46.6)
LYMPH%: 32.4 % (ref 14.0–49.7)
MCH: 25.6 pg (ref 25.1–34.0)
MCHC: 31.4 g/dL — ABNORMAL LOW (ref 31.5–36.0)
MONO#: 0.5 10*3/uL (ref 0.1–0.9)
NEUT%: 53.4 % (ref 38.4–76.8)
Platelets: 260 10*3/uL (ref 145–400)
WBC: 4.1 10*3/uL (ref 3.9–10.3)

## 2013-10-26 LAB — COMPREHENSIVE METABOLIC PANEL (CC13)
ALT: 268 U/L — ABNORMAL HIGH (ref 0–55)
Anion Gap: 11 mEq/L (ref 3–11)
CO2: 28 mEq/L (ref 22–29)
Creatinine: 0.9 mg/dL (ref 0.6–1.1)
Total Bilirubin: 0.82 mg/dL (ref 0.20–1.20)

## 2013-10-26 NOTE — H&P (Signed)
Frances [name]    MRN:  [id number]   Description: 47 year old female  Provider: [name], MD  Department: [facility]        Diagnoses    Biliary dyskinesia    -  Primary    575.8    GERD (gastroesophageal reflux disease)        530.81    Hypertension        401.9    History of cesarean section        V45.89    History of gastritis        V12.79      Reason for Visit    Cholelithiasis  Pancreatic cancer       Current Vitals - Last Recorded    BP Pulse Temp(Src) Resp Ht Wt    138/90 84 97.1 F (36.2 C) (Oral) 24 5\' 4"  (1.626 m) 222 lb (100.699 kg)       BMI - 38.09 kg/m2                     History and Physical   [name], MD    Status: Signed            Patient ID: Frances [name], female   DOB: September 20, 1966, 47 y.o.   MRN: [id number]          HPI Frances [name] is a 47 y.o. female.  She was initially referred by Dr. Marcelene Butte in [facility] for evaluation of biliary dyskinesia. Dr. Watt Climes is her PCP in [place].   She has no real chronic GI problems other than reflux. She doesn't really have much in the way of heartburn but she does have waterbrash. She could not tolerated AcipHex, but has been on omeprazole for 30 days.   Since July, she was been having intermittent episodes of epigastric pain radiating to her back. She gets cramps. She feels bloated after meals. She has mild nausea but no vomiting. This is now happening daily. Bowel movements are not really changing. She had an ultrasound on August 9 which shows a small right hepatic cyst. Gallbladder looked normal. She had an upper endoscopy on 08/13/2013. I have that report. This shows gastritis and duodenitis. Biopsies showed mild inflammation. The esophagus looked normal. H. Pylori was normal. Hepatobiliary scan with ejection fraction was performed on September 29. Ejection fraction was low at 15%, normal being greater than 33%. Cystic duct patent. She says she had some  cramping after drinking the Ensure. She is in no distress today.   Past history reveals the GERD, hypertension, cesarean section 2005. Her grandmother had a cholecystectomy but no other family members. She is married with one child he works for [employer]. Denies tobacco. Drinks wine occasionally.  She underwent laparoscopic cholecystectomy recently. The cholecystectomy and a cholangiogram were unremarkable. There was a mass effect behind the stomach. There was no evidence of metastatic cancer in the liver or in the omentum or in the root of the mesentery of the transverse colon. Postop CT scan suggested a mass in the head of the pancreas with vascular invasion. She has recovered from the cholecystectomy without any direct surgical complications. She has undergone endoscopic ultrasound and biopsy by Dr. Wendall Papa confirming adenocarcinoma. She has seen Dr. Mancel Bale who plans to give her primary chemotherapy And subsequently restage.  She is brought to the operating room electively for port a catheter  insertion.  Past Medical History   Diagnosis  Date   .  Hypertension     .  Herpes     .  Seasonal allergies           Past Surgical History   Procedure  Laterality  Date   .  Cesarean section          Social History History   Substance Use Topics   .  Smoking status:  Never Smoker    .  Smokeless tobacco:  Not on file   .  Alcohol Use:  No        No Known Allergies    Current Outpatient Prescriptions   Medication  Sig  Dispense  Refill   .  dicyclomine (BENTYL) 10 MG capsule  Take 20 mg by mouth every 6 (six) hours.         Marland Kitchen  HYDROCHLOROTHIAZIDE PO  Take by mouth daily.          Marland Kitchen  omeprazole (PRILOSEC) 40 MG capsule  Take 40 mg by mouth daily.         .  promethazine (PHENERGAN) 12.5 MG tablet  Take 25 mg by mouth every 6 (six) hours as needed for nausea.            Review of Systems  Constitutional: Negative for fever, chills and  unexpected weight change.  HENT: Negative for congestion, hearing loss, sore throat, trouble swallowing and voice change.   Eyes: Negative for visual disturbance.  Respiratory: Negative for cough and wheezing.   Cardiovascular: Negative for chest pain, palpitations and leg swelling.  Gastrointestinal: Positive for nausea, abdominal pain and abdominal distention. Negative for vomiting, diarrhea, constipation, blood in stool and anal bleeding.  Genitourinary: Negative for hematuria, vaginal bleeding and difficulty urinating.  Musculoskeletal: Positive for back pain. Negative for arthralgias.  Skin: Negative for rash and wound.  Neurological: Negative for seizures, syncope and headaches.  Hematological: Negative for adenopathy. Does not bruise/bleed easily.  Psychiatric/Behavioral: Negative for confusion.      Blood pressure 138/90, pulse 84, temperature 97.1 F (36.2 C), temperature source Oral, resp. rate 24, height 5\' 4"  (1.626 m), weight 222 lb (100.699 kg).   Physical Exam  Constitutional: She is oriented to person, place, and time. She appears well-developed and well-nourished. No distress.  BMI 37.93  HENT:   Head: Normocephalic and atraumatic.   Nose: Nose normal.   Mouth/Throat: No oropharyngeal exudate.  Eyes: Conjunctivae and EOM are normal. Pupils are equal, round, and reactive to light. Left eye exhibits no discharge. No scleral icterus.  Neck: Neck supple. No JVD present. No tracheal deviation present. No thyromegaly present.  Cardiovascular: Normal rate, regular rhythm, normal heart sounds and intact distal pulses.    No murmur heard. Pulmonary/Chest: Effort normal and breath sounds normal. No respiratory distress. She has no wheezes. She has no rales. She exhibits no tenderness.  Abdominal: Soft. Bowel sounds are normal. She exhibits no distension and no mass. There is no tenderness. There is no rebound and no guarding. Question of fullness to palpation in  epigastrium. Well healed Pfannenstiel incision.  Musculoskeletal: She exhibits no edema and no tenderness.  Lymphadenopathy:    She has no cervical adenopathy.  Neurological: She is alert and oriented to person, place, and time. She exhibits normal muscle tone. Coordination normal.  Skin: Skin is warm. No rash noted. She is not diaphoretic. No erythema. No pallor.  Psychiatric: She has a normal mood and affect. Her behavior is  normal. Judgment and thought content normal.      Data Reviewed Lots of note from her physicians, including gastroenterologist. Endoscopy report. CT scan. EUS report and cytopathology. Discussion in GI tumor board.  Discussion with Dr. Truett Perna. Imaging studies. Laboratory work.    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 Currently unresectable.    Status post recent laparoscopic cholecystectomy with cholangiogram.Cholangiogram was normal with no CBD stenosis or malignant change. Pathology of gallbladder showed chronic cholecystitis.   GERD. I told her this was independent and she might continue to have waterbrash and might require further medical management.   Hypertension   History cesarean section   Obesity, BMI of 37.9      Plan    She will be scheduled for insertion of Port-A-Cath under general anesthesia with fluoroscopic and possible ultrasound guidance.  She is aware of the indications, details, techniques, numerous risk of the surgery. She agrees with this plan.          Angelia Mould. Derrell Lolling, M.D., [facility] General and Minimally invasive Surgery Breast and Colorectal Surgery Office:   [phone] Pager:   [phone]

## 2013-10-26 NOTE — Progress Notes (Signed)
This patient is a 47 year old female diagnosed with pancreas cancer.  She is a patient of Dr. Truett Perna.  Past medical history includes hypertension, herpes, and GERD.  Medications include Flexeril, Prilosec, and Phenergan.  Labs include glucose 116, albumin 3.4 on October 25.  Height: 65 inches. Weight: 210.4 pounds. Usual body weight 255 pounds January 2013. BMI: 35.01.  Patient has had continued weight loss, documented over the past 2 years.  She reports pain with eating.  She has been consuming smaller amounts of food for better tolerance.  She is here for nutritional education while undergoing treatment for pancreas cancer.  Nutrition diagnosis: Food and nutrition related knowledge deficit related to new diagnosis of pancreas cancer and associated treatments as evidenced by no prior need for nutrition related information.  Nutrition diagnosis: Unintended weight loss related to diagnosis of pancreas cancer as evidenced by 17% weight loss from usual body weight documented January 2013.  Intervention: Patient was educated to consume smaller, more frequent meals with increased calories and protein to promote weight maintenance.  She was educated on foods that are high in protein.  I've recommended that patient begin oral nutrition supplements daily.  I have educated her on strategies for side effect management including nausea, vomiting, mucositis, and diarrhea.  She was encouraged to strive for weight maintenance.  Fact sheets were provided.  Questions were answered.  Teach back method was used.  My contact information was given.  Monitoring, evaluation, goals: Patient will tolerate adequate calories and protein to promote weight maintenance and maintenance of lean body mass.  Next visit: Thursday, November 20, during chemotherapy.

## 2013-10-27 ENCOUNTER — Ambulatory Visit (HOSPITAL_BASED_OUTPATIENT_CLINIC_OR_DEPARTMENT_OTHER)
Admission: RE | Admit: 2013-10-27 | Discharge: 2013-10-27 | Disposition: A | Discharge status: Home / Self Care | Payer: 59 | Source: Ambulatory Visit | Attending: Gastroenterology | Admitting: Gastroenterology

## 2013-10-27 ENCOUNTER — Encounter (HOSPITAL_COMMUNITY)
Admission: RE | Disposition: A | Discharge status: Home / Self Care | Payer: Self-pay | Source: Ambulatory Visit | Attending: Gastroenterology

## 2013-10-27 ENCOUNTER — Ambulatory Visit (HOSPITAL_COMMUNITY): Payer: 59 | Admitting: Anesthesiology

## 2013-10-27 ENCOUNTER — Ambulatory Visit (HOSPITAL_COMMUNITY): Payer: 59

## 2013-10-27 ENCOUNTER — Encounter (HOSPITAL_COMMUNITY): Payer: Self-pay | Admitting: Anesthesiology

## 2013-10-27 ENCOUNTER — Encounter (HOSPITAL_COMMUNITY): Payer: 59 | Admitting: Anesthesiology

## 2013-10-27 DIAGNOSIS — K831 Obstruction of bile duct: Secondary | ICD-10-CM | POA: Insufficient documentation

## 2013-10-27 DIAGNOSIS — E669 Obesity, unspecified: Secondary | ICD-10-CM | POA: Insufficient documentation

## 2013-10-27 DIAGNOSIS — Z9089 Acquired absence of other organs: Secondary | ICD-10-CM | POA: Insufficient documentation

## 2013-10-27 DIAGNOSIS — C250 Malignant neoplasm of head of pancreas: Secondary | ICD-10-CM | POA: Insufficient documentation

## 2013-10-27 DIAGNOSIS — I10 Essential (primary) hypertension: Secondary | ICD-10-CM | POA: Insufficient documentation

## 2013-10-27 DIAGNOSIS — K8689 Other specified diseases of pancreas: Secondary | ICD-10-CM | POA: Insufficient documentation

## 2013-10-27 DIAGNOSIS — R7989 Other specified abnormal findings of blood chemistry: Secondary | ICD-10-CM

## 2013-10-27 DIAGNOSIS — C259 Malignant neoplasm of pancreas, unspecified: Secondary | ICD-10-CM

## 2013-10-27 DIAGNOSIS — K828 Other specified diseases of gallbladder: Secondary | ICD-10-CM | POA: Insufficient documentation

## 2013-10-27 DIAGNOSIS — K219 Gastro-esophageal reflux disease without esophagitis: Secondary | ICD-10-CM | POA: Insufficient documentation

## 2013-10-27 DIAGNOSIS — Z6837 Body mass index (BMI) 37.0-37.9, adult: Secondary | ICD-10-CM | POA: Insufficient documentation

## 2013-10-27 HISTORY — PX: ENDOSCOPIC RETROGRADE CHOLANGIOPANCREATOGRAPHY (ERCP) WITH PROPOFOL: SHX5810

## 2013-10-27 SURGERY — ENDOSCOPIC RETROGRADE CHOLANGIOPANCREATOGRAPHY (ERCP) WITH PROPOFOL
Anesthesia: General

## 2013-10-27 MED ORDER — FENTANYL CITRATE 0.05 MG/ML IJ SOLN
INTRAMUSCULAR | Status: DC | PRN
  Administered 2013-10-27: 100 ug via INTRAVENOUS

## 2013-10-27 MED ORDER — PROPOFOL 10 MG/ML IV BOLUS
INTRAVENOUS | Status: DC | PRN
  Administered 2013-10-27: 150 mg via INTRAVENOUS

## 2013-10-27 MED ORDER — LACTATED RINGERS IV SOLN
INTRAVENOUS | Status: DC | PRN
  Administered 2013-10-27: 13:00:00 via INTRAVENOUS

## 2013-10-27 MED ORDER — SODIUM CHLORIDE 0.9 % IV SOLN
INTRAVENOUS | Status: DC | PRN
  Administered 2013-10-27: 15:00:00

## 2013-10-27 MED ORDER — GLYCOPYRROLATE 0.2 MG/ML IJ SOLN
INTRAMUSCULAR | Status: DC | PRN
  Administered 2013-10-27: .6 mg via INTRAVENOUS

## 2013-10-27 MED ORDER — NEOSTIGMINE METHYLSULFATE 1 MG/ML IJ SOLN
INTRAMUSCULAR | Status: DC | PRN
  Administered 2013-10-27: 4 mg via INTRAVENOUS

## 2013-10-27 MED ORDER — SODIUM CHLORIDE 0.9 % IV SOLN: INTRAVENOUS | Status: DC

## 2013-10-27 MED ORDER — MIDAZOLAM HCL 5 MG/5ML IJ SOLN
INTRAMUSCULAR | Status: DC | PRN
  Administered 2013-10-27: 2 mg via INTRAVENOUS

## 2013-10-27 MED ORDER — GLUCAGON HCL (RDNA) 1 MG IJ SOLR
INTRAMUSCULAR | Status: DC | PRN
  Administered 2013-10-27: .5 mg via INTRAVENOUS

## 2013-10-27 MED ORDER — LIDOCAINE HCL (CARDIAC) 20 MG/ML IV SOLN
INTRAVENOUS | Status: DC | PRN
  Administered 2013-10-27: 100 mg via INTRAVENOUS

## 2013-10-27 MED ORDER — ONDANSETRON HCL 4 MG/2ML IJ SOLN
INTRAMUSCULAR | Status: DC | PRN
  Administered 2013-10-27: 30 mg via INTRAVENOUS

## 2013-10-27 NOTE — Anesthesia Preprocedure Evaluation (Signed)
Anesthesia Evaluation  Patient identified by MRN, date of birth, ID band Patient awake    Reviewed: Allergy & Precautions, H&P , NPO status , Patient's Chart, lab work & pertinent test results  Airway Mallampati: II TM Distance: >3 FB Neck ROM: Full    Dental  (+) Dental Advisory Given   Pulmonary neg pulmonary ROS,  breath sounds clear to auscultation        Cardiovascular hypertension, Pt. on medications Rhythm:Regular Rate:Normal     Neuro/Psych negative neurological ROS  negative psych ROS   GI/Hepatic Neg liver ROS, GERD-  ,  Endo/Other  negative endocrine ROS  Renal/GU negative Renal ROS     Musculoskeletal negative musculoskeletal ROS (+)   Abdominal (+) + obese,   Peds  Hematology negative hematology ROS (+)   Anesthesia Other Findings   Reproductive/Obstetrics negative OB ROS                           Anesthesia Physical Anesthesia Plan  ASA: II  Anesthesia Plan: General   Post-op Pain Management:    Induction: Intravenous  Airway Management Planned: Oral ETT  Additional Equipment:   Intra-op Plan:   Post-operative Plan: Extubation in OR  Informed Consent: I have reviewed the patients History and Physical, chart, labs and discussed the procedure including the risks, benefits and alternatives for the proposed anesthesia with the patient or authorized representative who has indicated his/her understanding and acceptance.   Dental advisory given  Plan Discussed with: CRNA  Anesthesia Plan Comments:         Anesthesia Quick Evaluation

## 2013-10-27 NOTE — Interval H&P Note (Signed)
History and Physical Interval Note:  10/27/2013 12:42 PM  Frances [name]  has presented today for surgery, with the diagnosis of pancreatic ca  The various methods of treatment have been discussed with the patient and family. After consideration of risks, benefits and other options for treatment, the patient has consented to  Procedure(s): ENDOSCOPIC RETROGRADE CHOLANGIOPANCREATOGRAPHY (ERCP) WITH PROPOFOL (N/A) as a surgical intervention .  The patient's history has been reviewed, patient examined, no change in status, stable for surgery.  I have reviewed the patient's chart and labs.  Questions were answered to the patient's satisfaction.     Rachael Fee

## 2013-10-27 NOTE — H&P (View-Only) (Signed)
[facility] New Patient Consult   Referring MD: Haywood Ingram   Frances [name] 47 y.o.  09/18/1966    Reason for Referral: Pancreas cancer     HPI: She reports a history of "indigestion "beginning in July of 2014. She was evaluated in [place]. An upper endoscopy on 08/13/2013. A few antral erosions were seen, but no ulcers. The first and second portion of the duodenum appeared normal. Gastric and small bowel biopsies were obtained. The pathology revealed a mild chronic duodenitis and gastritis. No evidence of malignancy. H. pylori stains were negative.  She was placed on AcipHex and feels this caused nausea. Additional evaluation included an abdominal ultrasound was unremarkable. A hepatobiliary scan on 09/13/2013 revealed a low gallbladder ejection fraction.  She was referred to Dr. Ingram and underwent a cholecystectomy on 10/02/2013. The pathology from the gallbladder revealed chronic cholecystitis. A retroperitoneal fullness was noted to displace the stomach anteriorly. Upon lifting up a transverse colon the small bowel root of mesentery appeared "full ", but there was no sign of neoplasia.  She was referred for a CT of the abdomen and pelvis after surgery. An ill-defined pancreatic head mass measured 4.7 x 5 cm with associated pancreatic ductal dilatation. No significant biliary dilatation. Numerous mild to moderately enlarged lymph nodes were seen at the base of the mesentery. No significant adenopathy within the porta hepatis. 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 The mass was noted to abut the superior mesenteric artery although the mass did not fully in case ordinarily artery. Several low-density hepatic lesions were felt to be cysts.  She was referred to Dr. Jacobs and underwent an endoscopic ultrasound procedure on 10/12/2013. A hypoechoic mass was noted at the head of the pancreas. The mass was  noted to directly abut the superior mesenteric artery and encased the superior mesenteric vein/portal vein confluence. The mass causes pancreatic duct obstruction. The common bile duct was not dilated. The mass was biopsied. There were several scattered peripancreatic lymph nodes measuring 6-8 mm each. The cytology ([id number]) confirmed malignant cells consistent with adenocarcinoma.  She reports discomfort at the right shoulder and reflux symptoms have improved following the cholecystectomy. She continues to have a "nagging" abdominal pain.    Past Medical History  Diagnosis Date  . Hypertension   . 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 Seasonal allergies   . GERD (gastroesophageal reflux disease)   . Pancreatic cancer 10/12/13    malignant cells consistent w/adenocarcinoma    .    G1 P1  Past Surgical History  Procedure Laterality Date  . Cesarean section  2005  . Cholecystectomy N/A 10/02/2013    Procedure: LAPAROSCOPIC CHOLECYSTECTOMY WITH INTRAOPERATIVE CHOLANGIOGRAM POSSIBLE OPEN ;  Surgeon: Haywood M Ingram, MD;  Location: [facility]S;  Service: General;  Laterality: N/A;  . Eus N/A 10/12/2013    Procedure: UPPER ENDOSCOPIC ULTRASOUND (EUS) LINEAR;  Surgeon: Daniel P Jacobs, MD;  Location: [facility] END[facility]COPY;  Service: Endoscopy;  Laterality: N/A;  . Pancreas biopsy  10/12/13    FNA- pancreas head: malignant cells consistent w/adenocarcinoma    Family History  Problem Relation Age of Onset  . Hypertension Mother   . Diabetes Maternal Grandfather    no family history of cancer  Current outpatient prescriptions:acetaminophen (TYLENOL) 500 MG tablet, Take 1,000 mg by mouth every 6 (six) hours as needed for pain., Disp: , Rfl: ;  cyclobenzaprine (FLEXERIL) 10   MG tablet, Take 10 mg by mouth at bedtime. As needed, Disp: , Rfl: ;  hydrochlorothiazide (HYDRODIURIL) 25 MG tablet, Take 12.5 mg by mouth every evening., Disp: , Rfl:  HYDROmorphone (DILAUDID) 2 MG tablet, Take 1-2 tablets  (2-4 mg total) by mouth every 4 (four) hours as needed for severe pain., Disp: 60 tablet, Rfl: 0;  metoCLOPramide (REGLAN) 5 MG tablet, Take 1 tablet (5 mg total) by mouth 4 (four) times daily -  before meals and at bedtime. 30 minutes before meals and at bedtime, Disp: 50 tablet, Rfl: 2;  omeprazole (PRILOSEC) 40 MG capsule, Take 40 mg by mouth daily., Disp: , Rfl:  lidocaine-prilocaine (EMLA) cream, Apply 1 application topically as needed. Apply to PAC site 1-2 hours prior to stick and cover with plastic wrap, Disp: 30 g, Rfl: prn;  promethazine (PHENERGAN) 25 MG tablet, Take 1 tablet (25 mg total) by mouth every 6 (six) hours as needed for nausea or vomiting., Disp: 60 tablet, Rfl: 2  Allergies: No Known Allergies  Social History: She works in an office occupation. She does not use tobacco. Rare alcohol use. No risk factor for HIV or hepatitis.    ROS:   Positives include: "Nagging "abdominal discomfort, mid back pain, 40 pound weight loss, pain after eating, a few episodes of emesis, reflux symptoms better after the cholecystectomy and with eating small meals  A complete ROS was otherwise negative.  Physical Exam:  Blood pressure 164/98, pulse 120, temperature 98 F (36.7 C), temperature source Oral, resp. rate 20, height 5' 5" (1.651 m), weight 215 lb (97.523 kg), last menstrual period 09/14/2013.  HEENT: Oropharynx without visible mass, neck without mass Lungs: Clear bilaterally Cardiac: Regular rate and rhythm Abdomen: No hepatomegaly, healed surgical incisions, fullness in the mid upper abdomen with mild associated tenderness, no apparent ascites, no splenomegaly  Vascular: No leg edema Lymph nodes: No cervical, supraclavicular, axillary, or inguinal nodes Neurologic: Alert and oriented, the motor exam appears intact in the upper and lower extremities Skin: No rash Musculoskeletal: No spine tenderness   LAB:  CBC  Lab Results  Component Value Date   WBC 7.7 10/03/2013    HGB 11.9* 10/03/2013   HCT 35.9* 10/03/2013   MCV 80.0 10/03/2013   PLT 210 10/03/2013     CMP      Component Value Date/Time   NA 137 10/03/2013 0410   K 4.0 10/03/2013 0410   CL 102 10/03/2013 0410   CO2 26 10/03/2013 0410   GLUCOSE 116* 10/03/2013 0410   BUN 9 10/03/2013 0410   CREATININE 0.78 10/03/2013 0410   CALCIUM 9.7 10/03/2013 0410   PROT 6.5 10/03/2013 0410   ALBUMIN 3.4* 10/03/2013 0410   AST 87* 10/03/2013 0410   ALT 76* 10/03/2013 0410   ALKPHOS 101 10/03/2013 0410   BILITOT 0.3 10/03/2013 0410   GFRNONAA >90 10/03/2013 0410   GFRAA >90 10/03/2013 0410   10/03/2013: CEA 20.4, CA 19-9  4.9  Radiology: As per history of present illness    Assessment/Plan:   1. 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 Status post a cholecystectomy 10/02/2013 with the pathology revealing chronic cholecystitis  3. Abdomen/back pain secondary to #1  4. Hypertension   Disposition:   Frances [name] has been diagnosed with locally advanced pancreas cancer. I discussed the diagnosis and treatment options with Frances [name] and her husband. Her case was presented at the GI tumor conference on 10/14/2013. Review of the imaging studies by the   surgeons present at the conference was consistent with unresectable disease.  She is scheduled to see Dr. Murray later today. I discussed the case with Dr. Murray.  I explained there is no clear "standard "approach to treatment in patients with locally advanced pancreas cancer. We discussed the increased clinical response rate associated with several of the newer multiagent chemotherapy regimens. We also discussed 5-fluorouracil chemotherapy and radiation. I recommend treatment with FOLFIRINOX.  The plan is to complete several cycles of FOLFIRINOX prior to a restaging evaluation. We will then reconsider the indication for surgical resection versus additional chemotherapy or Xeloda/radiation.  We reviewed the potential  toxicities associated with FOLFIRINOX including the chance for nausea/vomiting, mucositis, diarrhea, alopecia, and hematologic toxicity. We discussed the abdominal pain and diarrhea associated with irinotecan. We reviewed the allergic reaction and various types of neuropathy seen with oxaliplatin. We discussed the rash, hyperpigmentation, and hand/foot syndrome associated with 5-fluorouracil. She will attend a chemotherapy teaching class.  We will refer her to Dr. Ingram for placement of a Port-A-Cath. The plan is to begin a first cycle of FOLFIRINOX on 10/29/2013. We will obtain a baseline CEA prior to beginning chemotherapy. She will be referred for a staging chest CT.  Approximately 60 minutes were spent with the patient and her family today. The majority of the time was used for counseling and coordination of care.  Ladanian Kelter 10/20/2013, 6:06 PM    

## 2013-10-27 NOTE — Anesthesia Postprocedure Evaluation (Signed)
Anesthesia Post Note  Patient: Frances [name]  Procedure(s) Performed: Procedure(s) (LRB): ENDOSCOPIC RETROGRADE CHOLANGIOPANCREATOGRAPHY (ERCP) WITH PROPOFOL (N/A)  Anesthesia type: General  Patient location: PACU  Post pain: Pain level controlled  Post assessment: Post-op Vital signs reviewed  Last Vitals:  Filed Vitals:   10/27/13 1501  BP: 144/89  Pulse:   Temp:   Resp: 14    Post vital signs: Reviewed  Level of consciousness: sedated  Complications: No apparent anesthesia complications

## 2013-10-27 NOTE — Op Note (Signed)
[facility] [address] [place], [address]   ERCP PROCEDURE REPORT  PATIENT: Frances [name], Frances D.  MR# :[id number] BIRTHDATE: 10-07-1966  GENDER: Female ENDOSCOPIST: Rachael Fee, MD PROCEDURE DATE:  10/27/2013 PROCEDURE:   ERCP with stent placement ASA CLASS:   Class II INDICATIONS:locally advanced pancreatic adenocarcinoma, dilated bile duct, elevated liver tests. MEDICATIONS: MAC sedation, administered by CRNA TOPICAL ANESTHETIC: none  DESCRIPTION OF PROCEDURE:   After the risks benefits and alternatives of the procedure were thoroughly explained, informed consent was obtained.  The Pentax ERCP [id number]  endoscope was introduced through the mouth  and advanced to the second portion of the duodenum  without detailed examination of the UGI tract    A 44 Autotome over a .035 hydrawire was used to cannulate the bile duct and contrast was injected.  An identical wire was temporarily placed into the main pancreatic duct to faciliated cannulation. Cholangiogram revealed a tight stricture in mid CBD.  The stricture was 1cm long, the distal most 2cm of the bile duct was normal, the bile duct proximal to the stricture was diffusely dilated (up to 1cm).  A 6cm long, 10mm diameter uncovered SEMS was placed across the biliary stricture in good position. The most distal 9-52mm of the stent extended across the major papilla into the duodenum. The scope was then completely withdrawn from the patient and the procedure terminated.  The main pancreatic duct was never injected with dye.    COMPLICATIONS: None  ENDOSCOPIC IMPRESSION: 1cm long mid bile duct stricture (from known locally advanced pancreatic cancer), stented today with 6cm long uncovered permanent metal stent.  RECOMMENDATIONS: Observe clinically.  Proceed with oncologic treatments.   ______________________________ eSigned:  Rachael Fee, MD 10/27/2013 2:32 PM   CC: Claud Kelp, MD; Mancel Bale, MD

## 2013-10-27 NOTE — Transfer of Care (Signed)
Immediate Anesthesia Transfer of Care Note  Patient: Frances [name]  Procedure(s) Performed: Procedure(s): ENDOSCOPIC RETROGRADE CHOLANGIOPANCREATOGRAPHY (ERCP) WITH PROPOFOL (N/A)  Patient Location: PACU  Anesthesia Type:General  Level of Consciousness: awake, alert  and oriented  Airway & Oxygen Therapy: Patient Spontanous Breathing  Post-op Assessment: Report given to PACU RN and Post -op Vital signs reviewed and stable  Post vital signs: Reviewed and stable  Complications: No apparent anesthesia complications

## 2013-10-28 ENCOUNTER — Ambulatory Visit (HOSPITAL_BASED_OUTPATIENT_CLINIC_OR_DEPARTMENT_OTHER)
Admission: RE | Admit: 2013-10-28 | Discharge: 2013-10-28 | Disposition: A | Discharge status: Home / Self Care | Payer: 59 | Source: Ambulatory Visit | Attending: General Surgery | Admitting: General Surgery

## 2013-10-28 ENCOUNTER — Ambulatory Visit (HOSPITAL_BASED_OUTPATIENT_CLINIC_OR_DEPARTMENT_OTHER): Payer: 59 | Admitting: Anesthesiology

## 2013-10-28 ENCOUNTER — Ambulatory Visit (HOSPITAL_COMMUNITY): Payer: 59

## 2013-10-28 ENCOUNTER — Encounter (HOSPITAL_BASED_OUTPATIENT_CLINIC_OR_DEPARTMENT_OTHER)
Admission: RE | Disposition: A | Discharge status: Home / Self Care | Payer: Self-pay | Source: Ambulatory Visit | Attending: General Surgery

## 2013-10-28 ENCOUNTER — Encounter (HOSPITAL_BASED_OUTPATIENT_CLINIC_OR_DEPARTMENT_OTHER): Payer: 59 | Admitting: Anesthesiology

## 2013-10-28 ENCOUNTER — Encounter (HOSPITAL_BASED_OUTPATIENT_CLINIC_OR_DEPARTMENT_OTHER): Payer: Self-pay | Admitting: Anesthesiology

## 2013-10-28 ENCOUNTER — Telehealth: Payer: Self-pay | Admitting: Gastroenterology

## 2013-10-28 ENCOUNTER — Telehealth: Payer: Self-pay | Admitting: *Deleted

## 2013-10-28 DIAGNOSIS — C259 Malignant neoplasm of pancreas, unspecified: Secondary | ICD-10-CM

## 2013-10-28 DIAGNOSIS — C250 Malignant neoplasm of head of pancreas: Secondary | ICD-10-CM | POA: Diagnosis present

## 2013-10-28 HISTORY — PX: PORTACATH PLACEMENT: SHX2246

## 2013-10-28 HISTORY — DX: Presence of spectacles and contact lenses: Z97.3

## 2013-10-28 SURGERY — INSERTION, TUNNELED CENTRAL VENOUS DEVICE, WITH PORT
Anesthesia: General | Laterality: Left | Wound class: Clean

## 2013-10-28 MED ORDER — ONDANSETRON HCL 4 MG/2ML IJ SOLN
INTRAMUSCULAR | Status: DC | PRN
  Administered 2013-10-28: 4 mg via INTRAVENOUS

## 2013-10-28 MED ORDER — OXYCODONE HCL 5 MG/5ML PO SOLN: 5.0000 mg | Freq: Once | ORAL | Status: AC | PRN

## 2013-10-28 MED ORDER — LIDOCAINE HCL (CARDIAC) 20 MG/ML IV SOLN
INTRAVENOUS | Status: DC | PRN
  Administered 2013-10-28: 40 mg via INTRAVENOUS

## 2013-10-28 MED ORDER — OXYCODONE HCL 5 MG PO TABS
ORAL_TABLET | ORAL | Status: AC
  Filled 2013-10-28: qty 1

## 2013-10-28 MED ORDER — HEPARIN SOD (PORK) LOCK FLUSH 100 UNIT/ML IV SOLN
INTRAVENOUS | Status: DC | PRN
  Administered 2013-10-28: 500 [IU]

## 2013-10-28 MED ORDER — HYDROMORPHONE HCL PF 1 MG/ML IJ SOLN
INTRAMUSCULAR | Status: AC
  Filled 2013-10-28: qty 1

## 2013-10-28 MED ORDER — SODIUM CHLORIDE 0.9 % IV SOLN: 250.0000 mL | INTRAVENOUS | Status: DC | PRN

## 2013-10-28 MED ORDER — HYDROMORPHONE HCL PF 1 MG/ML IJ SOLN
0.2500 mg | INTRAMUSCULAR | Status: DC | PRN
  Administered 2013-10-28: 0.5 mg via INTRAVENOUS

## 2013-10-28 MED ORDER — DEXAMETHASONE SODIUM PHOSPHATE 4 MG/ML IJ SOLN
INTRAMUSCULAR | Status: DC | PRN
  Administered 2013-10-28: 10 mg via INTRAVENOUS

## 2013-10-28 MED ORDER — SODIUM CHLORIDE 0.9 % IV SOLN: INTRAVENOUS | Status: DC

## 2013-10-28 MED ORDER — MIDAZOLAM HCL 2 MG/2ML IJ SOLN
INTRAMUSCULAR | Status: AC
  Filled 2013-10-28: qty 2

## 2013-10-28 MED ORDER — MIDAZOLAM HCL 5 MG/5ML IJ SOLN
INTRAMUSCULAR | Status: DC | PRN
  Administered 2013-10-28: 2 mg via INTRAVENOUS

## 2013-10-28 MED ORDER — CEFAZOLIN SODIUM 1-5 GM-% IV SOLN
INTRAVENOUS | Status: AC
  Filled 2013-10-28: qty 100

## 2013-10-28 MED ORDER — PROMETHAZINE HCL 25 MG/ML IJ SOLN: 6.2500 mg | INTRAMUSCULAR | Status: DC | PRN

## 2013-10-28 MED ORDER — FENTANYL CITRATE 0.05 MG/ML IJ SOLN: 25.0000 ug | INTRAMUSCULAR | Status: DC | PRN

## 2013-10-28 MED ORDER — ACETAMINOPHEN 325 MG PO TABS: 650.0000 mg | ORAL_TABLET | ORAL | Status: DC | PRN

## 2013-10-28 MED ORDER — HYDROCODONE-ACETAMINOPHEN 5-325 MG PO TABS: 1.0000 | ORAL_TABLET | ORAL | Status: DC | PRN

## 2013-10-28 MED ORDER — HEPARIN (PORCINE) IN NACL 2-0.9 UNIT/ML-% IJ SOLN
INTRAMUSCULAR | Status: AC
  Filled 2013-10-28: qty 500

## 2013-10-28 MED ORDER — CEFAZOLIN SODIUM-DEXTROSE 2-3 GM-% IV SOLR
2.0000 g | INTRAVENOUS | Status: AC
  Administered 2013-10-28: 2 g via INTRAVENOUS

## 2013-10-28 MED ORDER — POTASSIUM CHLORIDE CRYS ER 20 MEQ PO TBCR: 20.0000 meq | EXTENDED_RELEASE_TABLET | Freq: Two times a day (BID) | ORAL | Status: DC

## 2013-10-28 MED ORDER — SODIUM CHLORIDE 0.9 % IJ SOLN: 3.0000 mL | INTRAMUSCULAR | Status: DC | PRN

## 2013-10-28 MED ORDER — BUPIVACAINE-EPINEPHRINE PF 0.5-1:200000 % IJ SOLN
INTRAMUSCULAR | Status: DC | PRN
  Administered 2013-10-28: 10 mL

## 2013-10-28 MED ORDER — LIDOCAINE-EPINEPHRINE (PF) 1 %-1:200000 IJ SOLN
INTRAMUSCULAR | Status: AC
  Filled 2013-10-28: qty 10

## 2013-10-28 MED ORDER — LACTATED RINGERS IV SOLN: INTRAVENOUS | Status: DC

## 2013-10-28 MED ORDER — HEPARIN SOD (PORK) LOCK FLUSH 100 UNIT/ML IV SOLN
INTRAVENOUS | Status: AC
  Filled 2013-10-28: qty 5

## 2013-10-28 MED ORDER — OXYCODONE HCL 5 MG PO TABS: 5.0000 mg | ORAL_TABLET | ORAL | Status: DC | PRN

## 2013-10-28 MED ORDER — SODIUM BICARBONATE 4 % IV SOLN
INTRAVENOUS | Status: AC
  Filled 2013-10-28: qty 5

## 2013-10-28 MED ORDER — BUPIVACAINE-EPINEPHRINE PF 0.5-1:200000 % IJ SOLN
INTRAMUSCULAR | Status: AC
  Filled 2013-10-28: qty 30

## 2013-10-28 MED ORDER — OXYCODONE HCL 5 MG PO TABS
5.0000 mg | ORAL_TABLET | Freq: Once | ORAL | Status: AC | PRN
Start: 2013-10-28 — End: 2013-10-28
  Administered 2013-10-28: 5 mg via ORAL

## 2013-10-28 MED ORDER — LIDOCAINE-EPINEPHRINE 0.5 %-1:200000 IJ SOLN
INTRAMUSCULAR | Status: AC
  Filled 2013-10-28: qty 1

## 2013-10-28 MED ORDER — FENTANYL CITRATE 0.05 MG/ML IJ SOLN
INTRAMUSCULAR | Status: DC | PRN
  Administered 2013-10-28: 100 ug via INTRAVENOUS

## 2013-10-28 MED ORDER — CHLORHEXIDINE GLUCONATE 4 % EX LIQD: 1.0000 "application " | Freq: Once | CUTANEOUS | Status: DC

## 2013-10-28 MED ORDER — PROPOFOL 10 MG/ML IV BOLUS
INTRAVENOUS | Status: DC | PRN
  Administered 2013-10-28: 150 mg via INTRAVENOUS

## 2013-10-28 MED ORDER — FENTANYL CITRATE 0.05 MG/ML IJ SOLN: 50.0000 ug | INTRAMUSCULAR | Status: DC | PRN

## 2013-10-28 MED ORDER — HEPARIN (PORCINE) IN NACL 2-0.9 UNIT/ML-% IJ SOLN
INTRAMUSCULAR | Status: DC | PRN
  Administered 2013-10-28: 500 mL via INTRAVENOUS

## 2013-10-28 MED ORDER — SODIUM CHLORIDE 0.9 % IJ SOLN: 3.0000 mL | Freq: Two times a day (BID) | INTRAMUSCULAR | Status: DC

## 2013-10-28 MED ORDER — ONDANSETRON HCL 4 MG/2ML IJ SOLN: 4.0000 mg | Freq: Four times a day (QID) | INTRAMUSCULAR | Status: DC | PRN

## 2013-10-28 MED ORDER — ACETAMINOPHEN 650 MG RE SUPP: 650.0000 mg | RECTAL | Status: DC | PRN

## 2013-10-28 MED ORDER — FENTANYL CITRATE 0.05 MG/ML IJ SOLN
INTRAMUSCULAR | Status: AC
  Filled 2013-10-28: qty 4

## 2013-10-28 MED ORDER — LACTATED RINGERS IV SOLN
INTRAVENOUS | Status: DC
  Administered 2013-10-28: 20 mL/h via INTRAVENOUS
  Administered 2013-10-28: 10:00:00 via INTRAVENOUS

## 2013-10-28 MED ORDER — MIDAZOLAM HCL 2 MG/2ML IJ SOLN: 1.0000 mg | INTRAMUSCULAR | Status: DC | PRN

## 2013-10-28 SURGICAL SUPPLY — 52 items
BAG DECANTER FOR FLEXI CONT (MISCELLANEOUS) ×2 IMPLANT
BENZOIN TINCTURE PRP APPL 2/3 (GAUZE/BANDAGES/DRESSINGS) IMPLANT
BLADE HEX COATED 2.75 (ELECTRODE) ×2 IMPLANT
BLADE SURG 15 STRL LF DISP TIS (BLADE) ×1 IMPLANT
BLADE SURG 15 STRL SS (BLADE) ×1
CANISTER SUCT 1200ML W/VALVE (MISCELLANEOUS) IMPLANT
CHLORAPREP W/TINT 26ML (MISCELLANEOUS) ×2 IMPLANT
COVER MAYO STAND STRL (DRAPES) ×2 IMPLANT
COVER PROBE 5X48 (MISCELLANEOUS) ×1
COVER TABLE BACK 60X90 (DRAPES) ×2 IMPLANT
DECANTER SPIKE VIAL GLASS SM (MISCELLANEOUS) ×2 IMPLANT
DERMABOND ADVANCED (GAUZE/BANDAGES/DRESSINGS)
DERMABOND ADVANCED .7 DNX12 (GAUZE/BANDAGES/DRESSINGS) IMPLANT
DRAPE C-ARM 42X72 X-RAY (DRAPES) ×2 IMPLANT
DRAPE LAPAROSCOPIC ABDOMINAL (DRAPES) ×2 IMPLANT
DRAPE UTILITY XL STRL (DRAPES) ×2 IMPLANT
DRSG TEGADERM 2-3/8X2-3/4 SM (GAUZE/BANDAGES/DRESSINGS) IMPLANT
DRSG TEGADERM 4X10 (GAUZE/BANDAGES/DRESSINGS) ×2 IMPLANT
DRSG TEGADERM 4X4.75 (GAUZE/BANDAGES/DRESSINGS) IMPLANT
ELECT REM PT RETURN 9FT ADLT (ELECTROSURGICAL) ×2
ELECTRODE REM PT RTRN 9FT ADLT (ELECTROSURGICAL) ×1 IMPLANT
GAUZE SPONGE 4X4 12PLY STRL LF (GAUZE/BANDAGES/DRESSINGS) ×2 IMPLANT
GLOVE BIOGEL M 7.0 STRL (GLOVE) ×2 IMPLANT
GLOVE BIOGEL PI IND STRL 7.5 (GLOVE) ×2 IMPLANT
GLOVE BIOGEL PI INDICATOR 7.5 (GLOVE) ×2
GLOVE EUDERMIC 7 POWDERFREE (GLOVE) ×2 IMPLANT
GOWN PREVENTION PLUS XLARGE (GOWN DISPOSABLE) ×2 IMPLANT
GOWN PREVENTION PLUS XXLARGE (GOWN DISPOSABLE) ×2 IMPLANT
IV CATH PLACEMENT UNIT 16 GA (IV SOLUTION) IMPLANT
IV KIT MINILOC 20X1 SAFETY (NEEDLE) IMPLANT
KIT BARDPORT ISP (Port) IMPLANT
KIT CVR 48X5XPRB PLUP LF (MISCELLANEOUS) ×1 IMPLANT
KIT PORT POWER 8FR ISP CVUE (Catheter) ×2 IMPLANT
KIT PORT POWER ISP 8FR (Catheter) IMPLANT
NEEDLE BLUNT 17GA (NEEDLE) IMPLANT
NEEDLE HYPO 22GX1.5 SAFETY (NEEDLE) ×2 IMPLANT
NEEDLE HYPO 25X1 1.5 SAFETY (NEEDLE) ×2 IMPLANT
PACK BASIN DAY SURGERY FS (CUSTOM PROCEDURE TRAY) ×2 IMPLANT
PENCIL BUTTON HOLSTER BLD 10FT (ELECTRODE) ×2 IMPLANT
SET SHEATH INTRODUCER 10FR (MISCELLANEOUS) IMPLANT
SHEATH COOK PEEL AWAY SET 9F (SHEATH) IMPLANT
SLEEVE SCD COMPRESS KNEE MED (MISCELLANEOUS) IMPLANT
STRIP CLOSURE SKIN 1/2X4 (GAUZE/BANDAGES/DRESSINGS) ×2 IMPLANT
SUT MNCRL AB 4-0 PS2 18 (SUTURE) ×2 IMPLANT
SUT PROLENE 2 0 CT2 30 (SUTURE) ×2 IMPLANT
SUT VICRYL 3-0 CR8 SH (SUTURE) ×2 IMPLANT
SYR 5ML LUER SLIP (SYRINGE) ×2 IMPLANT
SYR CONTROL 10ML LL (SYRINGE) ×4 IMPLANT
TOWEL OR 17X24 6PK STRL BLUE (TOWEL DISPOSABLE) ×4 IMPLANT
TOWEL OR NON WOVEN STRL DISP B (DISPOSABLE) ×4 IMPLANT
TUBE CONNECTING 20X1/4 (TUBING) IMPLANT
YANKAUER SUCT BULB TIP NO VENT (SUCTIONS) IMPLANT

## 2013-10-28 NOTE — Telephone Encounter (Signed)
Pt returned call, she picked up Potassium. Understands to come in 0800 for lab prior to treatment 11/20.

## 2013-10-28 NOTE — Progress Notes (Signed)
Xray completed. Pt tolerated well. NPO until results confirmed.

## 2013-10-28 NOTE — Op Note (Signed)
Patient Name:           Frances [name]   Date of Surgery:        10/28/2013  Pre op Diagnosis:      Adenocarcinoma of the pancreas  Post op Diagnosis:    Same  Procedure:                 Insertion of 8 [place] power port tunneled venous vascular access device utilizing fluoroscopic guidance and positioning  Surgeon:                     Angelia Mould. Derrell Lolling, M.D., FACS  Assistant:                      None  Operative Indications:   This patient was recently evaluated for recurrent bouts of upper abdominal pain thought to be consistent with biliary colic. She was evaluated in [facility] with ultrasound and hepatobiliary scan. No stones were seen. Bile duct was not dilated. The HIDA scan showed delayed excretion. She is otherwise healthy. Cholecystectomy was performed and at the time of the surgery there appeared to be a retroperitoneal mass effect in the upper abdomen but there was no gross evidence of cancer and the cholangiogram was normal. CT scan showed a neoplastic-appearing mass in the head of the pancreas and there was strong suggestion of vascular involvement. Endoscopic ultrasound was performed and biopsy was performed confirming adenocarcinoma. She has seen Dr. Truett Perna who has recommended primary chemotherapy and has requested a port placement. I discussed the indications, details, techniques, numerous risks of port placement with the patient and her husband. All their questions were answered. They understand these issues. They agree with this plan.  Operative Findings:       The port was inserted through the left subclavian vein with a single needle pass. Fluoroscopy confirmed that the catheter tip was in the superior vena cava above the right atrium and there is no deformity. The catheter flushed well and aspirated blood easily.  Procedure in Detail:          Following the induction of a general LMA anesthetic the patient was positioned with a roll behind her shoulders and her  arms at her sides. The neck and chest were prepped and draped in a sterile fashion. Intravenous antibiotics were given. Surgical time out was performed. 0.5% Marcaine with epinephrine was used as a local infiltration anesthetic. A left subclavian venipuncture was performed with a single pass with good blood return and a guidewire inserted into the superior vena cava with fluoroscopic guidance. Using the C-arm I marked a template of the chest wall to help with guidance and positioning of the catheter. A small incision was made at the wire insertion site. I made a 3 cm incision below the medial aspect of the left clavicle. Subcutaneous tissue was debrided. Subcutaneous pocket was created at the level of the pectoralis fascia. Using a tunneling device to pass the catheter from the wire insertion site to the port pocket site. I then used the marked   template to measure the catheter and cut it 24 cm in length. The catheter was secured to the port with a locking device and flushed with heparinized saline. The port was sutured to the pectoralis fascia with 3 interrupted sutures of 2-0 Prolene. The dilator and peel-away sheath were inserted over the guidewire into the central venous circulation. The guidewire and dilator were removed. The catheter was  inserted without resistance and the peel-away sheath removed. I had excellent blood return and the catheter flushed easily. Fluoroscopy confirmed the tip of the catheter in the superior vena cava just above the right atrium and no deformity of the catheter along its course. The catheter and port were flushed with concentrated heparin. There was no bleeding. Subcutaneous tissue was closed with 3-0 Vicryl sutures and the skin incisions closed with subcuticular sutures of 4-0 Monocryl and Steri-Strips. Patient tolerated procedure well was taken to recovery where a chest x-ray will be taken. EBL 10 cc. Counts correct. Outpatient none.     Angelia Mould. Derrell Lolling, M.D.,  FACS General and Minimally Invasive Surgery Breast and Colorectal Surgery  10/28/2013 10:42 AM

## 2013-10-28 NOTE — Discharge Instructions (Signed)

## 2013-10-28 NOTE — Telephone Encounter (Signed)
Message copied by Caleb Popp on Wed Oct 28, 2013  9:21 AM ------      Message from: Ladene Artist      Created: Tue Oct 27, 2013  5:36 PM       Please call patient, start kcl bid, repeat cmet prior to folfirinox ------

## 2013-10-28 NOTE — Anesthesia Preprocedure Evaluation (Signed)
Anesthesia Evaluation  Patient identified by MRN, date of birth, ID band Patient awake    Reviewed: Allergy & Precautions, H&P , NPO status , Patient's Chart, lab work & pertinent test results  Airway Mallampati: I TM Distance: >3 FB Neck ROM: Full    Dental no notable dental hx. (+) Teeth Intact and Dental Advisory Given   Pulmonary neg pulmonary ROS,  breath sounds clear to auscultation  Pulmonary exam normal       Cardiovascular hypertension, On Medications Rhythm:Regular Rate:Normal     Neuro/Psych negative neurological ROS  negative psych ROS   GI/Hepatic Neg liver ROS, GERD-  Medicated and Poorly Controlled,  Endo/Other  Pancreatic CA  Renal/GU negative Renal ROS  negative genitourinary   Musculoskeletal   Abdominal   Peds  Hematology negative hematology ROS (+)   Anesthesia Other Findings   Reproductive/Obstetrics negative OB ROS                           Anesthesia Physical Anesthesia Plan  ASA: III  Anesthesia Plan: General   Post-op Pain Management:    Induction: Intravenous  Airway Management Planned: Oral ETT  Additional Equipment:   Intra-op Plan:   Post-operative Plan: Extubation in OR  Informed Consent: I have reviewed the patients History and Physical, chart, labs and discussed the procedure including the risks, benefits and alternatives for the proposed anesthesia with the patient or authorized representative who has indicated his/her understanding and acceptance.   Dental advisory given  Plan Discussed with: CRNA  Anesthesia Plan Comments:         Anesthesia Quick Evaluation

## 2013-10-28 NOTE — Interval H&P Note (Signed)
History and Physical Interval Note:  10/28/2013 9:45 AM  Frances [name]  has presented today for surgery, with the diagnosis of pancrectic cancer  The goals and the various methods of treatment have been discussed with the patient and family. After consideration of risks, benefits and other options for treatment, the patient has consented to  Procedure(s): INSERTION PORT-A-CATH (N/A) as a surgical intervention .  The patient's history has been reviewed, patient examined today,  no change in status, stable for surgery.  I have reviewed the patient's chart and labs.  Questions were answered to the patient's satisfaction.     [name]

## 2013-10-28 NOTE — Anesthesia Postprocedure Evaluation (Signed)
  Anesthesia Post-op Note  Patient: Frances [name]  Procedure(s) Performed: Procedure(s): INSERTION PORT-A-CATH LET SUBCLAVIAN (Left)  Patient Location: PACU  Anesthesia Type:General  Level of Consciousness: awake and alert   Airway and Oxygen Therapy: Patient Spontanous Breathing  Post-op Pain: mild  Post-op Assessment: Post-op Vital signs reviewed, Patient's Cardiovascular Status Stable and Respiratory Function Stable  Post-op Vital Signs: Reviewed  Filed Vitals:   10/28/13 1200  BP: 157/98  Pulse: 86  Temp:   Resp: 0    Complications: No apparent anesthesia complications

## 2013-10-28 NOTE — Anesthesia Procedure Notes (Signed)
Procedure Name: Intubation Date/Time: 10/28/2013 10:00 AM Performed by: Burna Cash Pre-anesthesia Checklist: Patient identified, Emergency Drugs available, Suction available and Patient being monitored Patient Re-evaluated:Patient Re-evaluated prior to inductionOxygen Delivery Method: Circle System Utilized Preoxygenation: Pre-oxygenation with 100% oxygen Intubation Type: IV induction Ventilation: Mask ventilation without difficulty Grade View: Grade I Tube type: Oral Tube size: 7.0 mm Number of attempts: 1 Airway Equipment and Method: stylet and oral airway Placement Confirmation: ETT inserted through vocal cords under direct vision,  positive ETCO2 and breath sounds checked- equal and bilateral Secured at: 22 cm Tube secured with: Tape Dental Injury: Teeth and Oropharynx as per pre-operative assessment

## 2013-10-28 NOTE — Transfer of Care (Signed)
Immediate Anesthesia Transfer of Care Note  Patient: Frances [name]  Procedure(s) Performed: Procedure(s): INSERTION PORT-A-CATH LET SUBCLAVIAN (Left)  Patient Location: PACU  Anesthesia Type:General  Level of Consciousness: awake, alert  and oriented  Airway & Oxygen Therapy: Patient Spontanous Breathing and Patient connected to face mask oxygen  Post-op Assessment: Report given to PACU RN and Post -op Vital signs reviewed and stable  Post vital signs: Reviewed and stable  Complications: No apparent anesthesia complications

## 2013-10-29 ENCOUNTER — Ambulatory Visit (HOSPITAL_BASED_OUTPATIENT_CLINIC_OR_DEPARTMENT_OTHER): Payer: 59

## 2013-10-29 ENCOUNTER — Other Ambulatory Visit: Payer: Self-pay

## 2013-10-29 ENCOUNTER — Telehealth: Payer: Self-pay | Admitting: Oncology

## 2013-10-29 ENCOUNTER — Encounter (HOSPITAL_COMMUNITY): Payer: Self-pay | Admitting: Emergency Medicine

## 2013-10-29 ENCOUNTER — Ambulatory Visit (HOSPITAL_BASED_OUTPATIENT_CLINIC_OR_DEPARTMENT_OTHER): Payer: 59 | Admitting: Oncology

## 2013-10-29 ENCOUNTER — Other Ambulatory Visit: Payer: Self-pay | Admitting: *Deleted

## 2013-10-29 ENCOUNTER — Emergency Department (HOSPITAL_COMMUNITY)
Admission: EM | Admit: 2013-10-29 | Discharge: 2013-10-29 | Disposition: A | Discharge status: Home / Self Care | Payer: 59 | Attending: Emergency Medicine | Admitting: Emergency Medicine

## 2013-10-29 ENCOUNTER — Ambulatory Visit: Payer: 59 | Admitting: Nutrition

## 2013-10-29 ENCOUNTER — Other Ambulatory Visit (HOSPITAL_BASED_OUTPATIENT_CLINIC_OR_DEPARTMENT_OTHER): Payer: 59

## 2013-10-29 VITALS — BP 143/90 | Temp 98.4°F | Resp 18 | Ht 65.0 in | Wt 211.7 lb

## 2013-10-29 VITALS — BP 194/112 | HR 116 | Temp 98.7°F | Resp 16

## 2013-10-29 DIAGNOSIS — Z79899 Other long term (current) drug therapy: Secondary | ICD-10-CM | POA: Insufficient documentation

## 2013-10-29 DIAGNOSIS — R4789 Other speech disturbances: Secondary | ICD-10-CM | POA: Insufficient documentation

## 2013-10-29 DIAGNOSIS — C259 Malignant neoplasm of pancreas, unspecified: Secondary | ICD-10-CM

## 2013-10-29 DIAGNOSIS — C250 Malignant neoplasm of head of pancreas: Secondary | ICD-10-CM

## 2013-10-29 DIAGNOSIS — I10 Essential (primary) hypertension: Secondary | ICD-10-CM

## 2013-10-29 DIAGNOSIS — T451X5A Adverse effect of antineoplastic and immunosuppressive drugs, initial encounter: Secondary | ICD-10-CM | POA: Insufficient documentation

## 2013-10-29 DIAGNOSIS — Z5111 Encounter for antineoplastic chemotherapy: Secondary | ICD-10-CM

## 2013-10-29 DIAGNOSIS — R479 Unspecified speech disturbances: Secondary | ICD-10-CM

## 2013-10-29 DIAGNOSIS — Z8619 Personal history of other infectious and parasitic diseases: Secondary | ICD-10-CM | POA: Insufficient documentation

## 2013-10-29 DIAGNOSIS — R109 Unspecified abdominal pain: Secondary | ICD-10-CM

## 2013-10-29 DIAGNOSIS — M549 Dorsalgia, unspecified: Secondary | ICD-10-CM

## 2013-10-29 DIAGNOSIS — K219 Gastro-esophageal reflux disease without esophagitis: Secondary | ICD-10-CM | POA: Insufficient documentation

## 2013-10-29 LAB — COMPREHENSIVE METABOLIC PANEL (CC13)
ALT: 146 U/L — ABNORMAL HIGH (ref 0–55)
AST: 50 U/L — ABNORMAL HIGH (ref 5–34)
Albumin: 3.3 g/dL — ABNORMAL LOW (ref 3.5–5.0)
Anion Gap: 11 mEq/L (ref 3–11)
CO2: 27 mEq/L (ref 22–29)
Creatinine: 0.8 mg/dL (ref 0.6–1.1)
Potassium: 3 mEq/L — CL (ref 3.5–5.1)
Sodium: 139 mEq/L (ref 136–145)
Total Bilirubin: 0.4 mg/dL (ref 0.20–1.20)
Total Protein: 6.3 g/dL — ABNORMAL LOW (ref 6.4–8.3)

## 2013-10-29 MED ORDER — ONDANSETRON 16 MG/50ML IVPB (CHCC)
16.0000 mg | Freq: Once | INTRAVENOUS | Status: AC
  Administered 2013-10-29: 16 mg via INTRAVENOUS

## 2013-10-29 MED ORDER — HEPARIN SOD (PORK) LOCK FLUSH 100 UNIT/ML IV SOLN
500.0000 [IU] | Freq: Once | INTRAVENOUS | Status: AC
  Administered 2013-10-29: 500 [IU]

## 2013-10-29 MED ORDER — ATROPINE SULFATE 1 MG/ML IJ SOLN
INTRAMUSCULAR | Status: AC
  Filled 2013-10-29: qty 1

## 2013-10-29 MED ORDER — ONDANSETRON 16 MG/50ML IVPB (CHCC)
INTRAVENOUS | Status: AC
  Filled 2013-10-29: qty 16

## 2013-10-29 MED ORDER — ATROPINE SULFATE 1 MG/ML IJ SOLN
0.5000 mg | Freq: Once | INTRAMUSCULAR | Status: AC | PRN
  Administered 2013-10-29: 0.5 mg via INTRAVENOUS

## 2013-10-29 MED ORDER — LEUCOVORIN CALCIUM INJECTION 350 MG
400.0000 mg/m2 | Freq: Once | INTRAVENOUS | Status: AC
  Administered 2013-10-29: 848 mg via INTRAVENOUS
  Filled 2013-10-29: qty 42.4

## 2013-10-29 MED ORDER — OXALIPLATIN CHEMO INJECTION 100 MG/20ML
85.0000 mg/m2 | Freq: Once | INTRAVENOUS | Status: AC
  Administered 2013-10-29: 180 mg via INTRAVENOUS
  Filled 2013-10-29: qty 36

## 2013-10-29 MED ORDER — FLUOROURACIL CHEMO INJECTION 5 GM/100ML
2400.0000 mg/m2 | INTRAVENOUS | Status: DC
  Administered 2013-10-29: 5100 mg via INTRAVENOUS
  Filled 2013-10-29: qty 102

## 2013-10-29 MED ORDER — HEPARIN SOD (PORK) LOCK FLUSH 100 UNIT/ML IV SOLN
INTRAVENOUS | Status: AC
  Administered 2013-10-29: 500 [IU]
  Filled 2013-10-29: qty 5

## 2013-10-29 MED ORDER — IRINOTECAN HCL CHEMO INJECTION 100 MG/5ML
180.0000 mg/m2 | Freq: Once | INTRAVENOUS | Status: AC
  Administered 2013-10-29: 382 mg via INTRAVENOUS
  Filled 2013-10-29: qty 19.1

## 2013-10-29 MED ORDER — DEXAMETHASONE SODIUM PHOSPHATE 20 MG/5ML IJ SOLN
INTRAMUSCULAR | Status: AC
  Filled 2013-10-29: qty 5

## 2013-10-29 MED ORDER — DEXTROSE 5 % IV SOLN
Freq: Once | INTRAVENOUS | Status: AC
  Administered 2013-10-29: 12:00:00 via INTRAVENOUS

## 2013-10-29 MED ORDER — DEXAMETHASONE SODIUM PHOSPHATE 20 MG/5ML IJ SOLN
20.0000 mg | Freq: Once | INTRAMUSCULAR | Status: AC
  Administered 2013-10-29: 20 mg via INTRAVENOUS

## 2013-10-29 NOTE — Progress Notes (Signed)
1730 pt mentioned she might be slurring her words, she just woke up from nap and went to restroom. A discussion of stomach gas from her stent placement ensued and her speech was clear. 1743 when hooking up 59fu pump a noticeable slurring of words..Pt denied any other symptoms -no chest tightness, no headache, no tingling, no blurring of vision, no weakness, no mental slowing. Dr Welton Flakes called and said to stop pump and take pt to ER. Pump disconnected. VS taken. Pt taken to ER in wheelchair. Husband and son at her side. Huber needle in port was left in situ for ER.

## 2013-10-29 NOTE — Telephone Encounter (Signed)
gv and pritned appt sched and avs for pt for pt for NOV and DEC....sed added tx.

## 2013-10-29 NOTE — ED Notes (Signed)
Bed: RESA Expected date:  Expected time:  Means of arrival:  Comments: Cancer pt, now slurring words, just got chemo

## 2013-10-29 NOTE — ED Notes (Signed)
Patient is alert and oriented x3.  She was given DC instructions and follow up visit instructions.  Patient gave verbal understanding. She was DC ambulatory under her own power to home.  V/S stable.  He was not showing any signs of distress on DC 

## 2013-10-29 NOTE — ED Provider Notes (Addendum)
CSN: [id number]     Arrival date & time 10/29/13  1812 History   First MD Initiated Contact with Patient 10/29/13 1814     Chief Complaint  Patient presents with  . Aphasia   (Consider location/radiation/quality/duration/timing/severity/associated sxs/prior Treatment) HPI Comments: Frances [name] is a 47 y.o. female who presents for evaluation of "slurred speech.", from the chemotherapy infusion unit. Symptoms started at about 5 PM today. She was sent here directly from the infusion unit. Today, with her first chemotherapy for pancreatic cancer. She relates some bilateral lower extremity tingling that she feels is secondary to the chemotherapy, since she was warned that that could happen. She denies headache, weakness, dizziness, nausea, vomiting, abdominal pain. She is back pain is related to her pancreatic cancer. She had pancreatic stenting done, yesterday. Otherwise, she took her usual medications, today.  There are no other known modifying factors.   The history is provided by the patient.    Past Medical History  Diagnosis Date  . Hypertension   . 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 Seasonal allergies   . GERD (gastroesophageal reflux disease)   . Pancreatic cancer 10/12/13    malignant cells consistent w/adenocarcinoma  . Wears glasses    Past Surgical History  Procedure Laterality Date  . Cesarean section  2005  . Cholecystectomy N/A 10/02/2013    Procedure: LAPAROSCOPIC CHOLECYSTECTOMY WITH INTRAOPERATIVE CHOLANGIOGRAM POSSIBLE OPEN ;  Surgeon: [name], MD;  Location: [facility] ORS;  Service: General;  Laterality: N/A;  . Eus N/A 10/12/2013    Procedure: UPPER ENDOSCOPIC ULTRASOUND (EUS) LINEAR;  Surgeon: Rachael Fee, MD;  Location: [facility] ENDOSCOPY;  Service: Endoscopy;  Laterality: N/A;  . Pancreas biopsy  10/12/13    FNA- pancreas head: malignant cells consistent w/adenocarcinoma  . Endoscopic retrograde cholangiopancreatography (ercp) with  propofol N/A 10/27/2013    Procedure: ENDOSCOPIC RETROGRADE CHOLANGIOPANCREATOGRAPHY (ERCP) WITH PROPOFOL;  Surgeon: Rachael Fee, MD;  Location: [facility] ENDOSCOPY;  Service: Endoscopy;  Laterality: N/A;   Family History  Problem Relation Age of Onset  . Hypertension Mother   . Diabetes Maternal Grandfather    History  Substance Use Topics  . Smoking status: Never Smoker   . Smokeless tobacco: Not on file  . Alcohol Use: No   OB History   Grav Para Term Preterm Abortions TAB SAB Ect Mult Living                 Review of Systems  All other systems reviewed and are negative.    Allergies  Review of patient's allergies indicates no known allergies.  Home Medications   Current Outpatient Rx  Name  Route  Sig  Dispense  Refill  . acetaminophen (TYLENOL) 500 MG tablet   Oral   Take 1,000 mg by mouth every 6 (six) hours as needed (pain).          . cyclobenzaprine (FLEXERIL) 10 MG tablet   Oral   Take 10 mg by mouth 3 (three) times daily as needed for muscle spasms. As needed         . hydrochlorothiazide (HYDRODIURIL) 25 MG tablet   Oral   Take 25 mg by mouth every evening.          Marland Kitchen HYDROcodone-acetaminophen (NORCO/VICODIN) 5-325 MG per tablet   Oral   Take 1-2 tablets by mouth every 4 (four) hours as needed.   30 tablet   0   . HYDROmorphone (DILAUDID) 2 MG  tablet   Oral   Take 2-4 mg by mouth every 4 (four) hours as needed for severe pain.         Marland Kitchen lidocaine-prilocaine (EMLA) cream   Topical   Apply 1 application topically as needed. Apply to [facility] site 1-2 hours prior to stick and cover with plastic wrap         . omeprazole (PRILOSEC) 40 MG capsule   Oral   Take 40 mg by mouth daily.         . potassium chloride SA (K-DUR,KLOR-CON) 20 MEQ tablet   Oral   Take 1 tablet (20 mEq total) by mouth 2 (two) times daily.   60 tablet   0   . promethazine (PHENERGAN) 25 MG tablet   Oral   Take 25 mg by mouth every 6 (six) hours as needed for nausea  or vomiting.          BP 175/92  Pulse 108  Temp(Src) 99.2 F (37.3 C)  Resp 18  SpO2 99%  LMP 10/25/2013 Physical Exam  Nursing note and vitals reviewed. Constitutional: She is oriented to person, place, and time. She appears well-developed and well-nourished.  HENT:  Head: Normocephalic and atraumatic.  Eyes: Conjunctivae and EOM are normal. Pupils are equal, round, and reactive to light.  Neck: Normal range of motion and phonation normal. Neck supple.  Cardiovascular: Normal rate, regular rhythm and intact distal pulses.   Pulmonary/Chest: Effort normal and breath sounds normal. She exhibits no tenderness.  Abdominal: Soft. She exhibits no distension. There is no tenderness. There is no guarding.  Musculoskeletal: Normal range of motion.  Neurological: She is alert and oriented to person, place, and time. No cranial nerve deficit. She exhibits normal muscle tone. Coordination (Normal finger to nose, and heel to shin, bilaterally) normal.  Unusual speech pattern, without aphasia, dysarthria , or ataxia. Her speech pattern completely normalizes when she answers questions in a rapid fashion.  Skin: Skin is warm and dry.  Psychiatric: Her behavior is normal. Judgment and thought content normal.  She is anxious    ED Course  Procedures (including critical care time)  CBG done to evaluate for hypoglycemia, is elevated; 163.  Stroke swallow screen, done by nursing, and she was given oral fluids, and nutrition.  7:32 PM Reevaluation with update and discussion. After initial assessment and treatment, an updated evaluation reveals no dysarthria, any aphasia, ataxia. She has tolerated oral fluids, and nutrition. Vital signs had been repeated, and the blood pressure, is improved. Raniya Golembeski L     Labs Review Labs Reviewed  GLUCOSE, CAPILLARY - Abnormal; Notable for the following:    Glucose-Capillary 163 (*)    All other components within normal limits   Imaging Review Dg  Chest Portable 1 View  10/28/2013   CLINICAL DATA:  Port-A-Cath insertion  EXAM: PORTABLE CHEST - 1 VIEW  COMPARISON:  10/23/2013-CT chest  FINDINGS: There is a left-sided Port-A-Cath with the tip projecting over the SVC in satisfactory position. There is elevation of the right diaphragm. There is no focal parenchymal opacity, pleural effusion, or pneumothorax. Normal cardiomediastinal silhouette. The osseous structures are unremarkable.  IMPRESSION: Left-sided Port-A-Cath in satisfactory position.   Electronically Signed   By: Elige Ko   On: 10/28/2013 11:15   Dg Fluoro Guide Cv Line-no Report  10/28/2013   CLINICAL DATA: Portacath Insertion   FLOURO GUIDE CV LINE  Fluoroscopy was utilized by the requesting physician.  No radiographic  interpretation.  EKG Interpretation   None       Date: 10/29/13  Rate: 107  Rhythm: sinus tachycardia  QRS Axis: normal  PR and QT Intervals: normal  ST/T Wave abnormalities: nonspecific T wave changes  PR and QRS Conduction Disutrbances:none  Narrative Interpretation:   Old EKG Reviewed: unchanged   MDM   1. Speech abnormality      Nonspecific speech abnormality, without evidence for acute CVA metabolic instability, infection or intracranial space occupying lesion. She is stable for discharge with current treatment regimen.    Nursing Notes Reviewed/ Care Coordinated, and agree without changes. Applicable Imaging Reviewed.  Interpretation of Laboratory Data incorporated into ED treatment   Plan: Home Medications- usual; Home Treatments and Observation- rest, fluids; return here if the recommended treatment, does not improve the symptoms; Recommended follow up- PCP, when necessary    Flint Melter, MD 10/29/13 1934  Flint Melter, MD 10/29/13 2106

## 2013-10-29 NOTE — Addendum Note (Signed)
Addendum created 10/29/13 1138 by Einar Pheasant, MD   Modules edited: Anesthesia Responsible Staff

## 2013-10-29 NOTE — Progress Notes (Signed)
Patient accessed with 20x 1 inch PAC needle which did not hit the back of the PAC due to some swelling over PAC, which was just placed yesterday.  Site looks fine otherwise.  PAC re-accessed with 19 G x 1.5 inch PAC needle.  Excellent blood return and labs obtained. 1530pm  After about of camptosar ( of  total).  Patient experiencing some mild cramping.  She is also c/o tingling in her lower legs.   Atropine given.  Patient up to bathroom without difficulty 1557.  Pulse down to 103 from 113.  Patient states her leg feel a little better.  Cramping is just a little better, however she did have some cramping at home this morning. 1604 restarted camptosar and leucovorin.  VO given and read back from Dr. Truett Perna to change pump rate to finish earlier on Saturday.  Discussed with Garald Braver RN.

## 2013-10-29 NOTE — Progress Notes (Signed)
Met with patient and husband during first treatment with chemotherapy.  Patient denies questions re: treatment and side effects.  She was given letter for [facility]  Husband was given letter for excuse from work today.  Patient denies other needs at present time and expressed appreciation to the [facility] team.  Will continue to follow as needed.

## 2013-10-29 NOTE — ED Notes (Signed)
Pt comes from Cancer center where she was receiving Folfirinox treatment for the first time. Pt was last known well at 1715 then found slurring speech around 1745. Pt vital siggns taken at cancer center @ 1755 16rr, 98.7, 194/112, HR 116.  Pt port is still saline locked for our use.

## 2013-10-29 NOTE — Progress Notes (Signed)
Nutrition followup completed with patient in chemotherapy room.  Patient is receiving her first chemotherapy.  She has no nutrition side effects at this time.  Her weight is stable and was documented as 211.7 pounds November 20.  She had no followup questions from my initial assessment dated November 17.  Nutrition diagnosis: Food and nutrition related knowledge deficit and unintended weight loss has improved.  Intervention: I reinforced/educated patient on the importance of small, more frequent meals with increased calories and protein for weight maintenance.  Teach back method used.  Monitoring, evaluation, goals: Patient is tolerating adequate calories and protein, and has had weight stability.  Next visit: Thursday, December 4, during chemotherapy.

## 2013-10-29 NOTE — Patient Instructions (Signed)
[facility] Discharge Instructions for Patients Receiving Chemotherapy  Today you received the following chemotherapy agents oxaliplatin, camptosar(irinotecan), leucovorin and a adrucil pump.  To help prevent nausea and vomiting after your treatment, we encourage you to take your nausea medication phenergan.  If you develop nausea and vomiting that is not controlled by your nausea medication, call the clinic.   BELOW ARE SYMPTOMS THAT SHOULD BE REPORTED IMMEDIATELY:  *FEVER GREATER THAN 100.5 F  *CHILLS WITH OR WITHOUT FEVER  NAUSEA AND VOMITING THAT IS NOT CONTROLLED WITH YOUR NAUSEA MEDICATION  *UNUSUAL SHORTNESS OF BREATH  *UNUSUAL BRUISING OR BLEEDING  TENDERNESS IN MOUTH AND THROAT WITH OR WITHOUT PRESENCE OF ULCERS  *URINARY PROBLEMS  *BOWEL PROBLEMS  UNUSUAL RASH Items with * indicate a potential emergency and should be followed up as soon as possible.  Feel free to call the clinic you have any questions or concerns. The clinic phone number is [phone].

## 2013-10-29 NOTE — Progress Notes (Signed)
   [facility]    OFFICE PROGRESS NOTE   INTERVAL HISTORY:   She returns for scheduled followup with pancreas cancer. She continues to have abdominal pain. The pain is relieved with Dilaudid. She reports right lateral abdomen and "gas "discomfort since undergoing placement of a biliary stent 10/27/2013. A Port-A-Cath was placed on 10/28/2013. She attended a chemotherapy teaching class.  Objective:  Vital signs in last 24 hours:  Blood pressure 143/90, temperature 98.4 F (36.9 C), temperature source Oral, resp. rate 18, height 5\' 5"  (1.651 m), weight 211 lb 11.2 oz (96.026 kg), last menstrual period 10/25/2013.   Resp: Lungs clear bilaterally Cardio: Regular rate and rhythm GI: No hepatomegaly, mild tenderness in the mid upper abdomen, no mass Vascular: No leg edema    Portacath/PICC-without erythema  Lab Results:  Lab Results  Component Value Date   WBC 4.1 10/26/2013   HGB 12.0 10/26/2013   HCT 38.2 10/26/2013   MCV 81.5 10/26/2013   PLT 260 10/26/2013   ANC 2.2  AST 22, ALT to 68, bilirubin 0.82  Medications: I have reviewed the patient's current medications.  Assessment/Plan: 1. 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 Status post a cholecystectomy 10/02/2013 with the pathology revealing chronic cholecystitis  3. Abdomen/back pain secondary to #1  4. Hypertension  5. Status post Port-A-Cath placement 10/28/2013 6. Elevated liver enzymes-? Related to biliary obstruction, status post placement of a bile duct stent on 10/27/2013   Disposition:  She appears stable. Her case was again presented at the GI tumor conference and the tumor appears unresectable. The plan is to proceed with FOLFIRINOX. She will return for an office visit and chemotherapy in 2 weeks. We will check the liver enzymes prior to chemotherapy today.   Thornton Papas, MD  10/29/2013  9:34 AM

## 2013-10-30 ENCOUNTER — Encounter (INDEPENDENT_AMBULATORY_CARE_PROVIDER_SITE_OTHER): Payer: 59 | Admitting: General Surgery

## 2013-10-30 ENCOUNTER — Telehealth (INDEPENDENT_AMBULATORY_CARE_PROVIDER_SITE_OTHER): Payer: Self-pay

## 2013-10-30 ENCOUNTER — Encounter (HOSPITAL_BASED_OUTPATIENT_CLINIC_OR_DEPARTMENT_OTHER): Payer: Self-pay | Admitting: General Surgery

## 2013-10-30 NOTE — Telephone Encounter (Signed)
Speech clear . She is still having gas - she took gas-x and mylanta. She has some leg tingling, I told pt to call back if she had other problems or concerns.

## 2013-10-30 NOTE — Telephone Encounter (Signed)
Pt complains of lower abd pain and bloating, She has tried gas ex with no relief, she is having a hard time passing gas but she is belching.  Please advise

## 2013-10-30 NOTE — Telephone Encounter (Signed)
Pt calling to ask if she can take a shower after getting the PAC insertion on 10/28/13. Per Dr Derrell Lolling ok to take shower no tub baths. The pt understands.

## 2013-10-30 NOTE — Telephone Encounter (Signed)
Pt has been given the recommendations from Dr Christella Hartigan, she will go to the ER if pain worsens or miralax does not relieve the pain

## 2013-10-30 NOTE — Telephone Encounter (Signed)
Left message on machine to call back  

## 2013-10-30 NOTE — Telephone Encounter (Signed)
She may be constipated from narcotic pain meds for her locally advanced pancreatic cancer.  She should start taking miralax 1-2 doses every day.

## 2013-10-30 NOTE — Telephone Encounter (Signed)
asdf

## 2013-10-30 NOTE — Telephone Encounter (Signed)
Message copied by Charma Igo on Fri Oct 30, 2013  1:30 PM ------      Message from: Ray[place] Citizen R      Created: Thu Oct 29, 2013  4:54 PM       Dr. Mancel Bale   1st Brandt Loosen Sharol Given ------

## 2013-10-31 ENCOUNTER — Telehealth: Payer: Self-pay | Admitting: *Deleted

## 2013-10-31 NOTE — Telephone Encounter (Signed)
Called patient about today's appointment for pump disconnect.  Frances [name] reports when she left the ED on 10-29-2013 the on call doctor ordered not to re-connect the ambulatory pump.  Reports no further slurred speech.  Currently constipated, forcing fluids and now believes "breaking things up with some bowel movements"

## 2013-11-07 ENCOUNTER — Other Ambulatory Visit: Payer: Self-pay | Admitting: Oncology

## 2013-11-11 ENCOUNTER — Telehealth: Payer: Self-pay | Admitting: Gastroenterology

## 2013-11-11 NOTE — Telephone Encounter (Signed)
Gas pain and belching has tried gas x but it did not work, what else can she try?

## 2013-11-12 ENCOUNTER — Ambulatory Visit (HOSPITAL_BASED_OUTPATIENT_CLINIC_OR_DEPARTMENT_OTHER): Payer: 59 | Admitting: Nurse Practitioner

## 2013-11-12 ENCOUNTER — Ambulatory Visit: Payer: 59 | Admitting: Nutrition

## 2013-11-12 ENCOUNTER — Telehealth: Payer: Self-pay | Admitting: Oncology

## 2013-11-12 ENCOUNTER — Ambulatory Visit (HOSPITAL_BASED_OUTPATIENT_CLINIC_OR_DEPARTMENT_OTHER): Payer: 59

## 2013-11-12 ENCOUNTER — Telehealth: Payer: Self-pay | Admitting: *Deleted

## 2013-11-12 ENCOUNTER — Other Ambulatory Visit (HOSPITAL_BASED_OUTPATIENT_CLINIC_OR_DEPARTMENT_OTHER): Payer: 59 | Admitting: Lab

## 2013-11-12 VITALS — BP 141/91 | HR 111 | Temp 98.3°F | Resp 20 | Ht 65.0 in | Wt 204.5 lb

## 2013-11-12 VITALS — BP 150/95 | HR 112 | Temp 99.1°F | Resp 18

## 2013-11-12 DIAGNOSIS — Z5111 Encounter for antineoplastic chemotherapy: Secondary | ICD-10-CM

## 2013-11-12 DIAGNOSIS — C259 Malignant neoplasm of pancreas, unspecified: Secondary | ICD-10-CM

## 2013-11-12 DIAGNOSIS — R109 Unspecified abdominal pain: Secondary | ICD-10-CM

## 2013-11-12 DIAGNOSIS — R209 Unspecified disturbances of skin sensation: Secondary | ICD-10-CM

## 2013-11-12 DIAGNOSIS — R259 Unspecified abnormal involuntary movements: Secondary | ICD-10-CM

## 2013-11-12 DIAGNOSIS — R4789 Other speech disturbances: Secondary | ICD-10-CM

## 2013-11-12 LAB — COMPREHENSIVE METABOLIC PANEL (CC13)
ALT: 40 U/L (ref 0–55)
AST: 23 U/L (ref 5–34)
Albumin: 3.2 g/dL — ABNORMAL LOW (ref 3.5–5.0)
Anion Gap: 10 mEq/L (ref 3–11)
CO2: 27 mEq/L (ref 22–29)
Calcium: 9.5 mg/dL (ref 8.4–10.4)
Chloride: 105 mEq/L (ref 98–109)
Creatinine: 0.8 mg/dL (ref 0.6–1.1)
Potassium: 3.3 mEq/L — ABNORMAL LOW (ref 3.5–5.1)
Sodium: 142 mEq/L (ref 136–145)
Total Protein: 6.6 g/dL (ref 6.4–8.3)

## 2013-11-12 LAB — CBC WITH DIFFERENTIAL/PLATELET
BASO%: 0.9 % (ref 0.0–2.0)
Basophils Absolute: 0 10*3/uL (ref 0.0–0.1)
Eosinophils Absolute: 0.1 10*3/uL (ref 0.0–0.5)
LYMPH%: 35.7 % (ref 14.0–49.7)
MCH: 26 pg (ref 25.1–34.0)
MCHC: 32.3 g/dL (ref 31.5–36.0)
MONO#: 0.4 10*3/uL (ref 0.1–0.9)
NEUT#: 2.3 10*3/uL (ref 1.5–6.5)
Platelets: 265 10*3/uL (ref 145–400)
RBC: 4.61 10*6/uL (ref 3.70–5.45)
lymph#: 1.6 10*3/uL (ref 0.9–3.3)
nRBC: 0 % (ref 0–0)

## 2013-11-12 MED ORDER — ATROPINE SULFATE 1 MG/ML IJ SOLN
0.5000 mg | Freq: Once | INTRAMUSCULAR | Status: AC | PRN
  Administered 2013-11-12: 0.5 mg via INTRAVENOUS

## 2013-11-12 MED ORDER — OXALIPLATIN CHEMO INJECTION 100 MG/20ML
85.0000 mg/m2 | Freq: Once | INTRAVENOUS | Status: AC
  Administered 2013-11-12: 180 mg via INTRAVENOUS
  Filled 2013-11-12: qty 36

## 2013-11-12 MED ORDER — DEXAMETHASONE SODIUM PHOSPHATE 20 MG/5ML IJ SOLN
INTRAMUSCULAR | Status: AC
  Filled 2013-11-12: qty 5

## 2013-11-12 MED ORDER — DEXAMETHASONE SODIUM PHOSPHATE 20 MG/5ML IJ SOLN
20.0000 mg | Freq: Once | INTRAMUSCULAR | Status: AC
  Administered 2013-11-12: 20 mg via INTRAVENOUS

## 2013-11-12 MED ORDER — LEUCOVORIN CALCIUM INJECTION 350 MG
400.0000 mg/m2 | Freq: Once | INTRAVENOUS | Status: AC
  Administered 2013-11-12: 848 mg via INTRAVENOUS
  Filled 2013-11-12: qty 42.4

## 2013-11-12 MED ORDER — DEXTROSE 5 % IV SOLN
Freq: Once | INTRAVENOUS | Status: AC
  Administered 2013-11-12: 12:00:00 via INTRAVENOUS

## 2013-11-12 MED ORDER — SODIUM CHLORIDE 0.9 % IV SOLN
2400.0000 mg/m2 | INTRAVENOUS | Status: DC
  Administered 2013-11-12: 5100 mg via INTRAVENOUS
  Filled 2013-11-12: qty 102

## 2013-11-12 MED ORDER — IRINOTECAN HCL CHEMO INJECTION 100 MG/5ML
179.0000 mg/m2 | Freq: Once | INTRAVENOUS | Status: AC
  Administered 2013-11-12: 380 mg via INTRAVENOUS
  Filled 2013-11-12: qty 19

## 2013-11-12 MED ORDER — ONDANSETRON 16 MG/50ML IVPB (CHCC)
INTRAVENOUS | Status: AC
  Filled 2013-11-12: qty 16

## 2013-11-12 MED ORDER — ATROPINE SULFATE 1 MG/ML IJ SOLN
INTRAMUSCULAR | Status: AC
  Filled 2013-11-12: qty 1

## 2013-11-12 MED ORDER — ONDANSETRON 16 MG/50ML IVPB (CHCC)
16.0000 mg | Freq: Once | INTRAVENOUS | Status: AC
  Administered 2013-11-12: 16 mg via INTRAVENOUS

## 2013-11-12 NOTE — Telephone Encounter (Signed)
Left message on machine to call back  

## 2013-11-12 NOTE — Progress Notes (Signed)
At approximately 1805, patient notified RN of slight facial twitching and that she felt her voice was changing. Upon asking patient how long this has been going on for, she stated one hour. VS taken and Dr. Truett Perna notified. He stated he thinks this is from the oxaliplatin and to monitor patient and that he would be over to see her shortly. MD assessed patient. Facial twitching slowly resolving. 5FU pump hooked up to patient per MD order and patient told to call the cancer center or go to the emergency room if symptoms do not resolve or worsen. Per MD, okay to D/C pump Saturday at 2:00pm. Angelena Form, RN

## 2013-11-12 NOTE — Progress Notes (Signed)
OFFICE PROGRESS NOTE  Interval history:  Frances [name] returns for followup of pancreas cancer. Cycle 1 of FOLFIRINOX was initiated on 10/29/2013. The cycle was prematurely discontinued (she did not receive the 5-FU pump) due to developing a speech problem. She was sent to the emergency Department for evaluation and then discharged home.  She does not describe the speech problem as slurred speech. Her husband noted that she was not using all of her facial muscles to talk. This lasted 1-1/2-2 hours. She noted tingling in her legs during the oxaliplatin infusion. She had intermittent tingling for several days. No persistent neuropathy symptoms. She denies nausea/vomiting. No mouth sores. She noted a slight increase in baseline loose stools from 1-2 times a day to 2-3 times a day following the chemotherapy. She reports "gas" discomfort. She has intermittent "nagging" back pain.   Objective: Blood pressure 141/91, pulse 111, temperature 98.3 F (36.8 C), temperature source Oral, resp. rate 20, height 5\' 5"  (1.651 m), weight 204 lb 8 oz (92.761 kg), last menstrual period 10/25/2013.  No thrush or ulcerations. Lungs are clear. Regular cardiac rhythm. Port-A-Cath site is without erythema. Abdomen is soft. No hepatomegaly. No mass. Extremities without edema. Vibratory sense intact over the fingertips per tuning fork exam.  Lab Results: Lab Results  Component Value Date   WBC 4.3 11/12/2013   HGB 12.0 11/12/2013   HCT 37.2 11/12/2013   MCV 80.7 11/12/2013   PLT 265 11/12/2013    Chemistry:    Chemistry      Component Value Date/Time   NA 139 10/29/2013 0814   NA 137 10/03/2013 0410   K 3.0* 10/29/2013 0814   K 4.0 10/03/2013 0410   CL 102 10/03/2013 0410   CO2 27 10/29/2013 0814   CO2 26 10/03/2013 0410   BUN 7.1 10/29/2013 0814   BUN 9 10/03/2013 0410   CREATININE 0.8 10/29/2013 0814   CREATININE 0.78 10/03/2013 0410      Component Value Date/Time   CALCIUM 9.8 10/29/2013 0814   CALCIUM  9.7 10/03/2013 0410   ALKPHOS 316* 10/29/2013 0814   ALKPHOS 101 10/03/2013 0410   AST 50* 10/29/2013 0814   AST 87* 10/03/2013 0410   ALT 146* 10/29/2013 0814   ALT 76* 10/03/2013 0410   BILITOT 0.40 10/29/2013 0814   BILITOT 0.3 10/03/2013 0410       Studies/Results: Ct Chest Wo Contrast  10/23/2013   CLINICAL DATA:  47 year old female with pancreatic cancer. Staging. Subsequent encounter.  EXAM: CT CHEST WITHOUT CONTRAST  TECHNIQUE: Multidetector CT imaging of the chest was performed following the standard protocol without IV contrast.  COMPARISON:  CT Abdomen and Pelvis 10/02/2013.  FINDINGS: Improved lung volumes. Resolved right lung base atelectasis. No pleural effusion. Major airways are patent. No right lung nodule. No left lung nodule. No abnormal pulmonary opacity.  No acute or suspicious osseous abnormality.  Negative non contrast thoracic inlet. No pericardial effusion. No hilar or mediastinal lymphadenopathy is evident. No axillary lymphadenopathy.  Interval resolved small volume pneumoperitoneum. Large indistinct mass centered at the neck of the pancreas seems progressed along with infiltration towards the root of the mesentery (series 2, images 54-63), with estimated transaxial dimensions of the mass now 58 x 58 mm (versus is 47 x 50 mm on 10/02/2013).  IMPRESSION: 1. No acute or metastatic process identified in the chest.  2. Suggestion of some interval progression of the pancreatic mass since 10/02/2013.   Electronically Signed   By: Si Gaul.D.  On: 10/23/2013 13:22   Dg Chest Portable 1 View  10/28/2013   CLINICAL DATA:  Port-A-Cath insertion  EXAM: PORTABLE CHEST - 1 VIEW  COMPARISON:  10/23/2013-CT chest  FINDINGS: There is a left-sided Port-A-Cath with the tip projecting over the SVC in satisfactory position. There is elevation of the right diaphragm. There is no focal parenchymal opacity, pleural effusion, or pneumothorax. Normal cardiomediastinal silhouette. The  osseous structures are unremarkable.  IMPRESSION: Left-sided Port-A-Cath in satisfactory position.   Electronically Signed   By: Elige Ko   On: 10/28/2013 11:15   Dg Ercp  10/27/2013   CLINICAL DATA:  Pancreatic carcinoma and biliary obstruction.  EXAM: ERCP  TECHNIQUE: Multiple spot images obtained with the fluoroscopic device and submitted for interpretation post-procedure.  COMPARISON:  CT of the abdomen on 10/02/2013.  FINDINGS: After cannulation of the common bile duct, injection of contrast material shows a high-grade focal stenosis of the mid common bile duct. A self expanding metal stent was placed across the stenosis.  IMPRESSION: ERCP imaging demonstrates evidence of a high-grade mid CBD stenosis. This was treated with placement of a self expanding metal stent.  These images were submitted for radiologic interpretation only. Please see the procedural report for the amount of contrast and the fluoroscopy time utilized.   Electronically Signed   By: Irish Lack M.D.   On: 10/27/2013 15:53   Dg Fluoro Guide Cv Line-no Report  10/28/2013   CLINICAL DATA: Portacath Insertion   FLOURO GUIDE CV LINE  Fluoroscopy was utilized by the requesting physician.  No radiographic  interpretation.     Medications: I have reviewed the patient's current medications.  Assessment/Plan:  1. 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 Initiation of FOLFIRINOX 10/29/2013. She did not complete the 5-FU pump due to developing altered speech. 2. Altered speech prior to completion of cycle 1 of FOLFIRINOX. Likely related to oxaliplatin. 3. Status post cholecystectomy 10/02/2013 with pathology revealing chronic cholecystitis. 4. Abdomen/back pain secondary to #1. 5. Hypertension. 6. Status post Port-A-Cath placement 10/28/2013. 7. Elevated liver enzymes 10/03/2013; progressive elevation 10/26/2013; improved 10/29/2013.  Disposition-she appears stable. Plan to proceed with cycle 2 FOLFIRINOX  today as scheduled. The oxaliplatin will be infused in a larger volume and over a longer period of time due to the episode of altered speech occurring with cycle one.  She will return for a followup visit and cycle 3 in 2 weeks. She will contact the office in the interim with any problems.  Patient seen with Dr. Truett Perna.  25 minutes were spent face-to-face at today's visit with the majority of that time involved in counseling/coordination of care.  Lonna Cobb ANP/GNP-BC    This was a shared visit with Lonna Cobb. She tolerated the first cycle of FOLFIRINOX well. She developed difficulty with speech following the oxaliplatin infusion. This was likely related to neurotoxicity from the oxaliplatin. Her symptoms spontaneously resolved. We will proceed with a second cycle of FOLFIRINOX today. The oxaliplatin will be diluted and given over a longer time.  She will return for an office visit and chemotherapy in 2 weeks.  Mancel Bale, M.D.  At the completion of the oxaliplatin infusion she again developed difficulty with speech. I was called to see her in the chemotherapy room at approximately 6:40 PM. Her speech was fluent. She had "twitching" of the lips and tongue. Mild tremors of the hands were also noted. She reported tingling in the legs. She appeared well. No dyspnea or rash. 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 The symptoms today occurred despite a longer infusion time. We will plan on a dose reduction of the oxaliplatin with the next cycle. She appears stable for discharge to home. She knows to contact us for worsening symptoms.  Mancel Bale, M.D.

## 2013-11-12 NOTE — Telephone Encounter (Signed)
Per staff phone call and POF I have schedueld appts.  JMW  

## 2013-11-12 NOTE — Progress Notes (Signed)
Patient reports she continues to have gas and abdominal discomfort.  She has been trying to reduce and eat less gas containing foods.  She has lost weight.  Weight documented as 204 pounds December 4, from 211.7 pounds November 20.  Nutrition diagnosis: Food and nutrition related knowledge deficit improved.  Nutrition diagnosis of unintended weight loss continues.  Intervention: Patient was educated on foods to avoid to reduce gas.  I provided additional strategies.  Patient was encouraged to continue protein, containing foods to strive for weight maintenance.  Teach back method used.  Monitoring, evaluation, goals: Patient will be able to tolerate increased calories and protein for weight maintenance.  Next visit: Wednesday, December 31, during chemotherapy.

## 2013-11-12 NOTE — Telephone Encounter (Signed)
Increase her PPI to twice daily.  Smaller more frequent meal trial as well.

## 2013-11-12 NOTE — Patient Instructions (Addendum)
[facility] Discharge Instructions for Patients Receiving Chemotherapy  Today you received the following chemotherapy agents: Folfirinox    To help prevent nausea and vomiting after your treatment, we encourage you to take your nausea medication as prescribed.   If you develop nausea and vomiting that is not controlled by your nausea medication, call the clinic.   BELOW ARE SYMPTOMS THAT SHOULD BE REPORTED IMMEDIATELY:  *FEVER GREATER THAN 100.5 F  *CHILLS WITH OR WITHOUT FEVER  NAUSEA AND VOMITING THAT IS NOT CONTROLLED WITH YOUR NAUSEA MEDICATION  *UNUSUAL SHORTNESS OF BREATH  *UNUSUAL BRUISING OR BLEEDING  TENDERNESS IN MOUTH AND THROAT WITH OR WITHOUT PRESENCE OF ULCERS  *URINARY PROBLEMS  *BOWEL PROBLEMS  UNUSUAL RASH Items with * indicate a potential emergency and should be followed up as soon as possible.  Feel free to call the clinic you have any questions or concerns. The clinic phone number is [phone].

## 2013-11-12 NOTE — Telephone Encounter (Signed)
appts made per 12/4 POF AVS and CAL given shh °

## 2013-11-13 MED ORDER — OMEPRAZOLE 40 MG PO CPDR: 40.0000 mg | DELAYED_RELEASE_CAPSULE | Freq: Two times a day (BID) | ORAL | Status: DC

## 2013-11-13 NOTE — Telephone Encounter (Signed)
Pt has been notified and refill on omeprazole has been sent

## 2013-11-14 ENCOUNTER — Ambulatory Visit: Payer: 59

## 2013-11-14 VITALS — BP 145/97 | HR 107 | Temp 98.0°F | Resp 20

## 2013-11-14 DIAGNOSIS — C259 Malignant neoplasm of pancreas, unspecified: Secondary | ICD-10-CM

## 2013-11-14 MED ORDER — SODIUM CHLORIDE 0.9 % IJ SOLN
10.0000 mL | INTRAMUSCULAR | Status: DC | PRN
  Administered 2013-11-14: 10 mL
  Filled 2013-11-14: qty 10

## 2013-11-14 MED ORDER — HEPARIN SOD (PORK) LOCK FLUSH 100 UNIT/ML IV SOLN
500.0000 [IU] | Freq: Once | INTRAVENOUS | Status: AC | PRN
  Administered 2013-11-14: 500 [IU]
  Filled 2013-11-14: qty 5

## 2013-11-17 ENCOUNTER — Other Ambulatory Visit: Payer: Self-pay | Admitting: *Deleted

## 2013-11-17 DIAGNOSIS — C259 Malignant neoplasm of pancreas, unspecified: Secondary | ICD-10-CM

## 2013-11-17 MED ORDER — POTASSIUM CHLORIDE CRYS ER 20 MEQ PO TBCR: 20.0000 meq | EXTENDED_RELEASE_TABLET | Freq: Two times a day (BID) | ORAL | Status: DC

## 2013-11-19 ENCOUNTER — Telehealth: Payer: Self-pay | Admitting: *Deleted

## 2013-11-19 NOTE — Telephone Encounter (Signed)
Received call from patient reporting congestion and non-productive cough.  She denies fever, chills, or other complaints.  She stated congestion was clear mucous.  She wants to know if she can take something over the counter for cough and congestion.  Per Dr. Truett Perna, "she can take Robitussin over the counter or Mucinex".  She was given this information and told to speak with pharmacist at drugstore about the OTC medication, side effects, dose, etc.  She verbalized understanding.

## 2013-11-20 ENCOUNTER — Telehealth: Payer: Self-pay | Admitting: Oncology

## 2013-11-20 NOTE — Telephone Encounter (Signed)
Gave pt appt for lab and ML on 12/30  r/s from 12/31 per ML

## 2013-11-22 ENCOUNTER — Other Ambulatory Visit: Payer: Self-pay | Admitting: Oncology

## 2013-11-26 ENCOUNTER — Telehealth: Payer: Self-pay | Admitting: *Deleted

## 2013-11-26 ENCOUNTER — Ambulatory Visit (HOSPITAL_BASED_OUTPATIENT_CLINIC_OR_DEPARTMENT_OTHER): Payer: 59 | Admitting: Oncology

## 2013-11-26 ENCOUNTER — Telehealth: Payer: Self-pay | Admitting: Oncology

## 2013-11-26 ENCOUNTER — Other Ambulatory Visit (HOSPITAL_BASED_OUTPATIENT_CLINIC_OR_DEPARTMENT_OTHER): Payer: 59

## 2013-11-26 ENCOUNTER — Ambulatory Visit (HOSPITAL_BASED_OUTPATIENT_CLINIC_OR_DEPARTMENT_OTHER): Payer: 59

## 2013-11-26 VITALS — BP 139/93 | HR 108 | Temp 98.3°F | Resp 18 | Ht 65.0 in | Wt 204.7 lb

## 2013-11-26 DIAGNOSIS — C259 Malignant neoplasm of pancreas, unspecified: Secondary | ICD-10-CM

## 2013-11-26 DIAGNOSIS — I10 Essential (primary) hypertension: Secondary | ICD-10-CM

## 2013-11-26 DIAGNOSIS — Z5111 Encounter for antineoplastic chemotherapy: Secondary | ICD-10-CM

## 2013-11-26 DIAGNOSIS — C250 Malignant neoplasm of head of pancreas: Secondary | ICD-10-CM

## 2013-11-26 DIAGNOSIS — R109 Unspecified abdominal pain: Secondary | ICD-10-CM

## 2013-11-26 LAB — CBC WITH DIFFERENTIAL/PLATELET
BASO%: 0.5 % (ref 0.0–2.0)
Basophils Absolute: 0 10*3/uL (ref 0.0–0.1)
EOS%: 1 % (ref 0.0–7.0)
Eosinophils Absolute: 0 10*3/uL (ref 0.0–0.5)
HCT: 35.4 % (ref 34.8–46.6)
HGB: 11.2 g/dL — ABNORMAL LOW (ref 11.6–15.9)
MCH: 25.9 pg (ref 25.1–34.0)
MCHC: 31.6 g/dL (ref 31.5–36.0)
MCV: 81.8 fL (ref 79.5–101.0)
MONO%: 11.4 % (ref 0.0–14.0)
NEUT%: 45.6 % (ref 38.4–76.8)
RBC: 4.33 10*6/uL (ref 3.70–5.45)
lymph#: 1.7 10*3/uL (ref 0.9–3.3)

## 2013-11-26 LAB — COMPREHENSIVE METABOLIC PANEL (CC13)
AST: 23 U/L (ref 5–34)
Alkaline Phosphatase: 160 U/L — ABNORMAL HIGH (ref 40–150)
Anion Gap: 10 mEq/L (ref 3–11)
BUN: 8.3 mg/dL (ref 7.0–26.0)
Creatinine: 0.8 mg/dL (ref 0.6–1.1)
Potassium: 3.6 mEq/L (ref 3.5–5.1)
Total Bilirubin: 0.43 mg/dL (ref 0.20–1.20)
Total Protein: 6.7 g/dL (ref 6.4–8.3)

## 2013-11-26 MED ORDER — DEXAMETHASONE SODIUM PHOSPHATE 20 MG/5ML IJ SOLN
INTRAMUSCULAR | Status: AC
  Filled 2013-11-26: qty 5

## 2013-11-26 MED ORDER — OXALIPLATIN CHEMO INJECTION 100 MG/20ML: 65.0000 mg/m2 | Freq: Once | INTRAVENOUS | Status: DC

## 2013-11-26 MED ORDER — DEXTROSE 5 % IV SOLN
Freq: Once | INTRAVENOUS | Status: AC
  Administered 2013-11-26: 10:00:00 via INTRAVENOUS

## 2013-11-26 MED ORDER — ONDANSETRON 16 MG/50ML IVPB (CHCC)
INTRAVENOUS | Status: AC
  Filled 2013-11-26: qty 16

## 2013-11-26 MED ORDER — LEUCOVORIN CALCIUM INJECTION 350 MG
400.0000 mg/m2 | Freq: Once | INTRAVENOUS | Status: AC
  Administered 2013-11-26: 848 mg via INTRAVENOUS
  Filled 2013-11-26: qty 42.4

## 2013-11-26 MED ORDER — DEXAMETHASONE SODIUM PHOSPHATE 20 MG/5ML IJ SOLN
20.0000 mg | Freq: Once | INTRAMUSCULAR | Status: AC
  Administered 2013-11-26: 20 mg via INTRAVENOUS

## 2013-11-26 MED ORDER — IRINOTECAN HCL CHEMO INJECTION 100 MG/5ML
180.0000 mg/m2 | Freq: Once | INTRAVENOUS | Status: AC
  Administered 2013-11-26: 382 mg via INTRAVENOUS
  Filled 2013-11-26: qty 19.1

## 2013-11-26 MED ORDER — ONDANSETRON 16 MG/50ML IVPB (CHCC)
16.0000 mg | Freq: Once | INTRAVENOUS | Status: AC
  Administered 2013-11-26: 16 mg via INTRAVENOUS

## 2013-11-26 MED ORDER — ATROPINE SULFATE 1 MG/ML IJ SOLN
INTRAMUSCULAR | Status: AC
  Filled 2013-11-26: qty 1

## 2013-11-26 MED ORDER — ATROPINE SULFATE 1 MG/ML IJ SOLN
0.5000 mg | Freq: Once | INTRAMUSCULAR | Status: AC | PRN
  Administered 2013-11-26: 0.5 mg via INTRAVENOUS

## 2013-11-26 MED ORDER — SODIUM CHLORIDE 0.9 % IV SOLN
2400.0000 mg/m2 | INTRAVENOUS | Status: DC
  Administered 2013-11-26: 5100 mg via INTRAVENOUS
  Filled 2013-11-26: qty 102

## 2013-11-26 MED ORDER — OXALIPLATIN CHEMO INJECTION 100 MG/20ML
65.0000 mg/m2 | Freq: Once | INTRAVENOUS | Status: AC
  Administered 2013-11-26: 140 mg via INTRAVENOUS
  Filled 2013-11-26: qty 28

## 2013-11-26 NOTE — Progress Notes (Signed)
Met with patient and her family.  She was given a handicap application signed per MD.  This RN returned a call to the patient's oncology case manager Silvestre Gunner) with [employer].  Her contact information is:  Phone # [phone] ext. [phone], fax # [phone].  Spoke with Selena Batten at [facility] medical records re: request from the case manager for H&P.  Case manger stated no other information needed at this time.  Patient is aware of above.  Patient denies barriers to care at present time.  Will continue to follow.

## 2013-11-26 NOTE — Patient Instructions (Addendum)
[facility] Discharge Instructions for Patients Receiving Chemotherapy  Today you received the following chemotherapy agents: Oxaliplatin, Leucovorin, Camptosar, 5FU  To help prevent nausea and vomiting after your treatment, we encourage you to take your Phenergan 25 mg every 6 hrs as needed.   If you develop nausea and vomiting that is not controlled by your nausea medication, call the clinic.   BELOW ARE SYMPTOMS THAT SHOULD BE REPORTED IMMEDIATELY:  *FEVER GREATER THAN 100.5 F  *CHILLS WITH OR WITHOUT FEVER  NAUSEA AND VOMITING THAT IS NOT CONTROLLED WITH YOUR NAUSEA MEDICATION  *UNUSUAL SHORTNESS OF BREATH  *UNUSUAL BRUISING OR BLEEDING  TENDERNESS IN MOUTH AND THROAT WITH OR WITHOUT PRESENCE OF ULCERS  *URINARY PROBLEMS  *BOWEL PROBLEMS  UNUSUAL RASH Items with * indicate a potential emergency and should be followed up as soon as possible.  Feel free to call the clinic you have any questions or concerns. The clinic phone number is [phone].

## 2013-11-26 NOTE — Telephone Encounter (Signed)
Per staff message and POF I have scheduled appts.  JMW  

## 2013-11-26 NOTE — Telephone Encounter (Signed)
appts made per 12/17 POF (never dropped in msg box) AVs and CAL given shh

## 2013-11-26 NOTE — Progress Notes (Signed)
   [facility]    OFFICE PROGRESS NOTE   INTERVAL HISTORY:   She returns for scheduled followup of pancreas cancer. She completed a second cycle of FOLFIRINOX on 11/12/2013. She developed twitching of the lips and tongue at the completion of the oxaliplatin infusion. This resolved the next day. Cold sensitivity lasted for several days following chemotherapy. She reports 2-3 days of diarrhea.  The abdominal pain has improved. She is no longer taking pain medication.   She had no difficulty swallowing, rash, or dyspnea following chemotherapy.  Objective:  Vital signs in last 24 hours:  Blood pressure 139/93, pulse 108, temperature 98.3 F (36.8 C), temperature source Oral, resp. rate 18, height 5\' 5"  (1.651 m), weight 204 lb 11.2 oz (92.851 kg).    HEENT: No thrush or ulcers Resp: Lungs clear bilaterally Cardio: Regular rate and rhythm GI: No hepatomegaly, no mass, nontender Vascular: No leg edema  Portacath/PICC-without erythema  Lab Results:  Lab Results  Component Value Date   WBC 4.0 11/26/2013   HGB 11.2* 11/26/2013   HCT 35.4 11/26/2013   MCV 81.8 11/26/2013   PLT 188 11/26/2013   ANC 1.8    Medications: I have reviewed the patient's current medications.  Assessment/Plan: 1. 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 Initiation of FOLFIRINOX 10/29/2013. She did not complete the 5-FU pump due to developing altered speech. 2. Altered speech prior to completion of cycle 1 of FOLFIRINOX, similar symptoms and tongue/lip twitching following the oxaliplatin with cycle 2 FOLFIRINOX 3. Status post cholecystectomy 10/02/2013 with pathology revealing chronic cholecystitis. 4. Abdomen/back pain secondary to #1. Improved 5. Hypertension. 6. Status post Port-A-Cath placement 10/28/2013. 7. History of elevated liver enzyme-improved  Disposition:  She appears well. She again had neurologic symptoms following cycle 2 FOLFIRINOX. These were  transient and she had no symptoms of an allergic reaction. The plan is to proceed with cycle 3 of FOLFIRINOX today. The oxaliplatin will again be given over 4 hours and we decreased the oxaliplatin dose.  She will return for an office visit on 12/08/2013. Cycle 4 of FOLFIRINOX will be given on 12/09/2013   Thornton Papas, MD  11/26/2013  9:18 AM

## 2013-11-28 ENCOUNTER — Ambulatory Visit (HOSPITAL_BASED_OUTPATIENT_CLINIC_OR_DEPARTMENT_OTHER): Payer: 59

## 2013-11-28 VITALS — BP 145/96 | HR 100 | Temp 98.6°F | Resp 18

## 2013-11-28 DIAGNOSIS — Z452 Encounter for adjustment and management of vascular access device: Secondary | ICD-10-CM

## 2013-11-28 DIAGNOSIS — C250 Malignant neoplasm of head of pancreas: Secondary | ICD-10-CM

## 2013-11-28 DIAGNOSIS — C259 Malignant neoplasm of pancreas, unspecified: Secondary | ICD-10-CM

## 2013-11-28 MED ORDER — HEPARIN SOD (PORK) LOCK FLUSH 100 UNIT/ML IV SOLN
500.0000 [IU] | Freq: Once | INTRAVENOUS | Status: AC
  Administered 2013-11-28: 500 [IU] via INTRAVENOUS
  Filled 2013-11-28: qty 5

## 2013-11-28 MED ORDER — SODIUM CHLORIDE 0.9 % IJ SOLN
10.0000 mL | INTRAMUSCULAR | Status: DC | PRN
  Administered 2013-11-28: 10 mL via INTRAVENOUS
  Filled 2013-11-28: qty 10

## 2013-12-06 ENCOUNTER — Other Ambulatory Visit: Payer: Self-pay | Admitting: Oncology

## 2013-12-07 ENCOUNTER — Telehealth: Payer: Self-pay | Admitting: *Deleted

## 2013-12-07 NOTE — Telephone Encounter (Signed)
Per patient voicemail, I have called her back regarding her appt for 12/31. I have left her a message to call me back.

## 2013-12-07 NOTE — Telephone Encounter (Signed)
Patient called back and I moved her appt on 12/31 to earlier in the dauy

## 2013-12-08 ENCOUNTER — Ambulatory Visit (HOSPITAL_BASED_OUTPATIENT_CLINIC_OR_DEPARTMENT_OTHER): Payer: 59 | Admitting: Nurse Practitioner

## 2013-12-08 ENCOUNTER — Other Ambulatory Visit (HOSPITAL_BASED_OUTPATIENT_CLINIC_OR_DEPARTMENT_OTHER): Payer: 59

## 2013-12-08 VITALS — BP 130/87 | HR 90 | Temp 97.0°F | Ht 65.0 in | Wt 205.0 lb

## 2013-12-08 DIAGNOSIS — C259 Malignant neoplasm of pancreas, unspecified: Secondary | ICD-10-CM

## 2013-12-08 LAB — CBC WITH DIFFERENTIAL/PLATELET
Basophils Absolute: 0 10*3/uL (ref 0.0–0.1)
EOS%: 0.7 % (ref 0.0–7.0)
Eosinophils Absolute: 0 10*3/uL (ref 0.0–0.5)
HCT: 33.3 % — ABNORMAL LOW (ref 34.8–46.6)
HGB: 10.6 g/dL — ABNORMAL LOW (ref 11.6–15.9)
MCV: 83 fL (ref 79.5–101.0)
MONO#: 0.5 10*3/uL (ref 0.1–0.9)
MONO%: 11.2 % (ref 0.0–14.0)
NEUT#: 1.7 10*3/uL (ref 1.5–6.5)
NEUT%: 42.1 % (ref 38.4–76.8)
Platelets: 234 10*3/uL (ref 145–400)
RBC: 4.01 10*6/uL (ref 3.70–5.45)
RDW: 15.6 % — ABNORMAL HIGH (ref 11.2–14.5)
WBC: 4.1 10*3/uL (ref 3.9–10.3)

## 2013-12-08 LAB — COMPREHENSIVE METABOLIC PANEL (CC13)
AST: 19 U/L (ref 5–34)
Albumin: 3.4 g/dL — ABNORMAL LOW (ref 3.5–5.0)
Anion Gap: 13 mEq/L — ABNORMAL HIGH (ref 3–11)
BUN: 8.4 mg/dL (ref 7.0–26.0)
CO2: 26 mEq/L (ref 22–29)
Calcium: 9.4 mg/dL (ref 8.4–10.4)
Glucose: 109 mg/dl (ref 70–140)
Potassium: 3.3 mEq/L — ABNORMAL LOW (ref 3.5–5.1)
Sodium: 144 mEq/L (ref 136–145)
Total Protein: 7 g/dL (ref 6.4–8.3)

## 2013-12-08 NOTE — Progress Notes (Signed)
OFFICE PROGRESS NOTE  Interval history:  Frances [name] returns for followup of pancreas cancer. She completed cycle 3 FOLFIRINOX on 11/26/2013. She noted less of the neurologic symptoms than with previous cycles. She denies shortness of breath, chest tightness, skin rash. She has mild intermittent nausea. No vomiting. She has loose stools for about 3 days following each treatment. No mouth sores. She has mild persistent cold sensitivity. No numbness or tingling in the hands or feet and the absence of cold exposure. Back pain is better overall.   Objective: Filed Vitals:   12/08/13 1513  BP: 130/87  Pulse: 90  Temp: 97 F (36.1 C)     No thrush or ulcerations. Lungs clear. Regular cardiac rhythm. Port-A-Cath site without erythema. Abdomen soft and nontender. No hepatomegaly. No leg edema.  Lab Results: Lab Results  Component Value Date   WBC 4.1 12/08/2013   HGB 10.6* 12/08/2013   HCT 33.3* 12/08/2013   MCV 83.0 12/08/2013   PLT 234 12/08/2013   NEUTROABS 1.7 12/08/2013    Chemistry:    Chemistry      Component Value Date/Time   NA 144 12/08/2013 1455   NA 137 10/03/2013 0410   K 3.3* 12/08/2013 1455   K 4.0 10/03/2013 0410   CL 102 10/03/2013 0410   CO2 26 12/08/2013 1455   CO2 26 10/03/2013 0410   BUN 8.4 12/08/2013 1455   BUN 9 10/03/2013 0410   CREATININE 0.9 12/08/2013 1455   CREATININE 0.78 10/03/2013 0410      Component Value Date/Time   CALCIUM 9.4 12/08/2013 1455   CALCIUM 9.7 10/03/2013 0410   ALKPHOS 161* 12/08/2013 1455   ALKPHOS 101 10/03/2013 0410   AST 19 12/08/2013 1455   AST 87* 10/03/2013 0410   ALT 20 12/08/2013 1455   ALT 76* 10/03/2013 0410   BILITOT 0.27 12/08/2013 1455   BILITOT 0.3 10/03/2013 0410       Studies/Results: No results found.  Medications: I have reviewed the patient's current medications.  Assessment/Plan:  1. 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 Initiation of FOLFIRINOX 10/29/2013. She did  not complete the 5-FU pump due to developing altered speech. Cycle 2 completed 11/12/2013. Cycle 3 completed 11/26/2013. 2. Altered speech prior to completion of cycle 1 of FOLFIRINOX, similar symptoms and tongue/lip twitching following the oxaliplatin with cycle 2 FOLFIRINOX. Oxaliplatin was dose reduced and given over 4 hours beginning with cycle 3. She noted less neurologic symptoms following cycle 3. 3. Status post cholecystectomy 10/02/2013 with pathology revealing chronic cholecystitis. 4. Abdomen/back pain secondary to #1. Improved 5. Hypertension. 6. Status post Port-A-Cath placement 10/28/2013. 7. History of elevated liver enzyme-improved. 8. Hypokalemia. She is not taking the potassium supplement consistently. She will try taking as prescribed.   Dispositon-she appears stable. She has completed 3 cycles of FOLFIRINOX. Plan to proceed with cycle 4 as scheduled on 12/09/2013. We will obtain repeat CEA and CA 19-9 values when she is here 12/09/2013.  She will return for a followup visit and cycle 5 on 12/24/2013. She will contact the office in the interim with any problems.  Plan reviewed with Dr. Truett Perna.   Frances [name] ANP/GNP-BC

## 2013-12-09 ENCOUNTER — Ambulatory Visit (HOSPITAL_BASED_OUTPATIENT_CLINIC_OR_DEPARTMENT_OTHER): Payer: 59

## 2013-12-09 ENCOUNTER — Other Ambulatory Visit: Payer: 59

## 2013-12-09 ENCOUNTER — Ambulatory Visit: Payer: 59 | Admitting: Nurse Practitioner

## 2013-12-09 ENCOUNTER — Ambulatory Visit: Payer: 59 | Admitting: Nutrition

## 2013-12-09 VITALS — BP 153/94 | HR 94 | Temp 98.3°F | Resp 20

## 2013-12-09 DIAGNOSIS — C259 Malignant neoplasm of pancreas, unspecified: Secondary | ICD-10-CM

## 2013-12-09 DIAGNOSIS — C250 Malignant neoplasm of head of pancreas: Secondary | ICD-10-CM

## 2013-12-09 DIAGNOSIS — Z5111 Encounter for antineoplastic chemotherapy: Secondary | ICD-10-CM

## 2013-12-09 LAB — CEA: CEA: 27.9 ng/mL — ABNORMAL HIGH (ref 0.0–5.0)

## 2013-12-09 LAB — CANCER ANTIGEN 19-9: CA 19-9: 1.9 U/mL (ref <35.0)

## 2013-12-09 MED ORDER — ONDANSETRON 16 MG/50ML IVPB (CHCC)
16.0000 mg | Freq: Once | INTRAVENOUS | Status: AC
  Administered 2013-12-09: 16 mg via INTRAVENOUS

## 2013-12-09 MED ORDER — ATROPINE SULFATE 1 MG/ML IJ SOLN
0.5000 mg | Freq: Once | INTRAMUSCULAR | Status: AC | PRN
  Administered 2013-12-09: 0.5 mg via INTRAVENOUS

## 2013-12-09 MED ORDER — DEXAMETHASONE SODIUM PHOSPHATE 20 MG/5ML IJ SOLN
20.0000 mg | Freq: Once | INTRAMUSCULAR | Status: AC
  Administered 2013-12-09: 20 mg via INTRAVENOUS

## 2013-12-09 MED ORDER — ONDANSETRON 16 MG/50ML IVPB (CHCC)
INTRAVENOUS | Status: AC
  Filled 2013-12-09: qty 16

## 2013-12-09 MED ORDER — LEUCOVORIN CALCIUM INJECTION 350 MG
400.0000 mg/m2 | Freq: Once | INTRAMUSCULAR | Status: AC
  Administered 2013-12-09: 848 mg via INTRAVENOUS
  Filled 2013-12-09: qty 42.4

## 2013-12-09 MED ORDER — DEXTROSE 5 % IV SOLN
Freq: Once | INTRAVENOUS | Status: AC
  Administered 2013-12-09: 09:00:00 via INTRAVENOUS

## 2013-12-09 MED ORDER — IRINOTECAN HCL CHEMO INJECTION 100 MG/5ML
380.0000 mg | Freq: Once | INTRAVENOUS | Status: AC
  Administered 2013-12-09: 380 mg via INTRAVENOUS
  Filled 2013-12-09: qty 19

## 2013-12-09 MED ORDER — DEXAMETHASONE SODIUM PHOSPHATE 20 MG/5ML IJ SOLN
INTRAMUSCULAR | Status: AC
  Filled 2013-12-09: qty 5

## 2013-12-09 MED ORDER — ATROPINE SULFATE 1 MG/ML IJ SOLN
INTRAMUSCULAR | Status: AC
  Filled 2013-12-09: qty 1

## 2013-12-09 MED ORDER — OXALIPLATIN CHEMO INJECTION 100 MG/20ML
65.0000 mg/m2 | Freq: Once | INTRAVENOUS | Status: AC
  Administered 2013-12-09: 140 mg via INTRAVENOUS
  Filled 2013-12-09: qty 28

## 2013-12-09 MED ORDER — SODIUM CHLORIDE 0.9 % IV SOLN
2400.0000 mg/m2 | INTRAVENOUS | Status: DC
  Administered 2013-12-09: 5100 mg via INTRAVENOUS
  Filled 2013-12-09: qty 102

## 2013-12-09 NOTE — Progress Notes (Signed)
Patient feels well and is eating well.  She reports diarrhea after chemotherapy.  Weight is stable and was documented as 205 pounds on December 30.  Patient has incorporated strategies for eating with diarrhea with good results.  Nutrition Diagnosis:  Food and Nutrition Related Knowledge deficit improved. Diagnosis of Unintended weight loss improved.  Intervention:  Patient educated to continue strategies for increased intake for weight maintenance.  Questions answered and teach back method used.  Monitoring, evaluation, goals:  Patient will tolerate adequate calories and protein to maintain weight.  Next Visit: Thursday, January 15 during chemotherapy.

## 2013-12-11 ENCOUNTER — Ambulatory Visit (HOSPITAL_BASED_OUTPATIENT_CLINIC_OR_DEPARTMENT_OTHER): Payer: 59

## 2013-12-11 VITALS — BP 140/89 | HR 90 | Temp 97.9°F

## 2013-12-11 DIAGNOSIS — C259 Malignant neoplasm of pancreas, unspecified: Secondary | ICD-10-CM

## 2013-12-11 MED ORDER — HEPARIN SOD (PORK) LOCK FLUSH 100 UNIT/ML IV SOLN
500.0000 [IU] | Freq: Once | INTRAVENOUS | Status: AC | PRN
Start: 2013-12-11 — End: 2013-12-11
  Administered 2013-12-11: 500 [IU]
  Filled 2013-12-11: qty 5

## 2013-12-11 MED ORDER — SODIUM CHLORIDE 0.9 % IJ SOLN
10.0000 mL | INTRAMUSCULAR | Status: DC | PRN
  Administered 2013-12-11: 10 mL
  Filled 2013-12-11: qty 10

## 2013-12-23 ENCOUNTER — Other Ambulatory Visit: Payer: Self-pay | Admitting: Oncology

## 2013-12-24 ENCOUNTER — Other Ambulatory Visit (HOSPITAL_BASED_OUTPATIENT_CLINIC_OR_DEPARTMENT_OTHER): Payer: 59

## 2013-12-24 ENCOUNTER — Ambulatory Visit: Payer: 59 | Admitting: Nutrition

## 2013-12-24 ENCOUNTER — Ambulatory Visit (HOSPITAL_BASED_OUTPATIENT_CLINIC_OR_DEPARTMENT_OTHER): Payer: 59 | Admitting: Oncology

## 2013-12-24 ENCOUNTER — Ambulatory Visit (HOSPITAL_BASED_OUTPATIENT_CLINIC_OR_DEPARTMENT_OTHER): Payer: 59

## 2013-12-24 ENCOUNTER — Telehealth: Payer: Self-pay | Admitting: Oncology

## 2013-12-24 VITALS — BP 132/90 | HR 98 | Temp 98.9°F | Resp 18 | Ht 65.0 in | Wt 206.3 lb

## 2013-12-24 DIAGNOSIS — I10 Essential (primary) hypertension: Secondary | ICD-10-CM

## 2013-12-24 DIAGNOSIS — C250 Malignant neoplasm of head of pancreas: Secondary | ICD-10-CM

## 2013-12-24 DIAGNOSIS — E876 Hypokalemia: Secondary | ICD-10-CM

## 2013-12-24 DIAGNOSIS — Z5111 Encounter for antineoplastic chemotherapy: Secondary | ICD-10-CM

## 2013-12-24 DIAGNOSIS — C259 Malignant neoplasm of pancreas, unspecified: Secondary | ICD-10-CM

## 2013-12-24 LAB — COMPREHENSIVE METABOLIC PANEL (CC13)
ALT: 28 U/L (ref 0–55)
ANION GAP: 11 meq/L (ref 3–11)
AST: 29 U/L (ref 5–34)
Albumin: 3.3 g/dL — ABNORMAL LOW (ref 3.5–5.0)
Alkaline Phosphatase: 172 U/L — ABNORMAL HIGH (ref 40–150)
BUN: 8.5 mg/dL (ref 7.0–26.0)
CO2: 27 meq/L (ref 22–29)
Calcium: 9.2 mg/dL (ref 8.4–10.4)
Chloride: 104 mEq/L (ref 98–109)
Creatinine: 0.8 mg/dL (ref 0.6–1.1)
GLUCOSE: 104 mg/dL (ref 70–140)
POTASSIUM: 3.4 meq/L — AB (ref 3.5–5.1)
SODIUM: 142 meq/L (ref 136–145)
TOTAL PROTEIN: 6.6 g/dL (ref 6.4–8.3)
Total Bilirubin: 0.49 mg/dL (ref 0.20–1.20)

## 2013-12-24 LAB — CBC WITH DIFFERENTIAL/PLATELET
BASO%: 0.6 % (ref 0.0–2.0)
Basophils Absolute: 0 10*3/uL (ref 0.0–0.1)
EOS%: 0.8 % (ref 0.0–7.0)
Eosinophils Absolute: 0 10*3/uL (ref 0.0–0.5)
HCT: 33.5 % — ABNORMAL LOW (ref 34.8–46.6)
HGB: 10.7 g/dL — ABNORMAL LOW (ref 11.6–15.9)
LYMPH#: 1.5 10*3/uL (ref 0.9–3.3)
LYMPH%: 35.6 % (ref 14.0–49.7)
MCH: 26.6 pg (ref 25.1–34.0)
MCHC: 31.9 g/dL (ref 31.5–36.0)
MCV: 83.3 fL (ref 79.5–101.0)
MONO#: 0.6 10*3/uL (ref 0.1–0.9)
MONO%: 13.7 % (ref 0.0–14.0)
NEUT#: 2.1 10*3/uL (ref 1.5–6.5)
NEUT%: 49.3 % (ref 38.4–76.8)
PLATELETS: 186 10*3/uL (ref 145–400)
RBC: 4.02 10*6/uL (ref 3.70–5.45)
RDW: 15.6 % — ABNORMAL HIGH (ref 11.2–14.5)
WBC: 4.2 10*3/uL (ref 3.9–10.3)

## 2013-12-24 MED ORDER — SODIUM CHLORIDE 0.9 % IV SOLN
2400.0000 mg/m2 | INTRAVENOUS | Status: DC
  Administered 2013-12-24: 5100 mg via INTRAVENOUS
  Filled 2013-12-24: qty 102

## 2013-12-24 MED ORDER — ONDANSETRON 16 MG/50ML IVPB (CHCC)
16.0000 mg | Freq: Once | INTRAVENOUS | Status: AC
  Administered 2013-12-24: 16 mg via INTRAVENOUS

## 2013-12-24 MED ORDER — DEXTROSE 5 % IV SOLN
Freq: Once | INTRAVENOUS | Status: AC
  Administered 2013-12-24: 11:00:00 via INTRAVENOUS

## 2013-12-24 MED ORDER — DEXAMETHASONE SODIUM PHOSPHATE 20 MG/5ML IJ SOLN
20.0000 mg | Freq: Once | INTRAMUSCULAR | Status: AC
  Administered 2013-12-24: 20 mg via INTRAVENOUS

## 2013-12-24 MED ORDER — DEXAMETHASONE SODIUM PHOSPHATE 20 MG/5ML IJ SOLN
INTRAMUSCULAR | Status: AC
  Filled 2013-12-24: qty 5

## 2013-12-24 MED ORDER — OXALIPLATIN CHEMO INJECTION 100 MG/20ML
65.0000 mg/m2 | Freq: Once | INTRAVENOUS | Status: AC
  Administered 2013-12-24: 140 mg via INTRAVENOUS
  Filled 2013-12-24: qty 28

## 2013-12-24 MED ORDER — LEUCOVORIN CALCIUM INJECTION 350 MG
400.0000 mg/m2 | Freq: Once | INTRAVENOUS | Status: AC
  Administered 2013-12-24: 848 mg via INTRAVENOUS
  Filled 2013-12-24: qty 42.4

## 2013-12-24 MED ORDER — ATROPINE SULFATE 1 MG/ML IJ SOLN
INTRAMUSCULAR | Status: AC
  Filled 2013-12-24: qty 1

## 2013-12-24 MED ORDER — ONDANSETRON 16 MG/50ML IVPB (CHCC)
INTRAVENOUS | Status: AC
  Filled 2013-12-24: qty 16

## 2013-12-24 MED ORDER — IRINOTECAN HCL CHEMO INJECTION 100 MG/5ML
179.0000 mg/m2 | Freq: Once | INTRAVENOUS | Status: AC
  Administered 2013-12-24: 380 mg via INTRAVENOUS
  Filled 2013-12-24: qty 19

## 2013-12-24 MED ORDER — ATROPINE SULFATE 1 MG/ML IJ SOLN
0.5000 mg | Freq: Once | INTRAMUSCULAR | Status: AC | PRN
  Administered 2013-12-24: 0.5 mg via INTRAVENOUS

## 2013-12-24 MED ORDER — LOPERAMIDE HCL 2 MG PO CAPS: 2.0000 mg | ORAL_CAPSULE | ORAL | Status: DC | PRN

## 2013-12-24 NOTE — Telephone Encounter (Signed)
gv adn printed appt sched and avs for pt for Jan...gv pt barium °

## 2013-12-24 NOTE — Progress Notes (Signed)
   [place]    OFFICE PROGRESS NOTE   INTERVAL HISTORY:   She returns for scheduled followup with pancreas cancer. She completed cycle 4 of FOLFIRINOX on 12/09/2013. She reports "twitching" of the tongue for 2 days following chemotherapy. Cold sensitivity and tingling in the periphery lasted for 2-3 days. She reports altered taste. She had diarrhea for 3-4 days. She is working. The abdominal pain is better.  Objective:  Vital signs in last 24 hours:  Blood pressure 132/90, pulse 98, temperature 98.9 F (37.2 C), temperature source Oral, resp. rate 18, height 5\' 5"  (1.651 m), weight 206 lb 4.8 oz (93.577 kg).    HEENT: No thrush or ulcers Resp: Lungs clear bilaterally Cardio: Regular rate and rhythm GI: No hepatomegaly, nontender, no mass Vascular: No leg edema Neuro: The vibratory sense is intact at the fingertips bilaterally  Skin: Hyperpigmentation of the hands   Portacath/PICC-without erythema  Lab Results:  Lab Results  Component Value Date   WBC 4.2 12/24/2013   HGB 10.7* 12/24/2013   HCT 33.5* 12/24/2013   MCV 83.3 12/24/2013   PLT 186 12/24/2013   NEUTROABS 2.1 12/24/2013   Potassium 3.4, creatinine 0.8   Medications: I have reviewed the patient's current medications.  Assessment/Plan: 1. 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 Initiation of FOLFIRINOX 10/29/2013. She did not complete the 5-FU pump due to developing altered speech. Cycle 2 completed 11/12/2013. Cycle 3 completed 11/26/2013. Cycle 4 completed 12/09/2013 2. Altered speech prior to completion of cycle 1 of FOLFIRINOX, similar symptoms and tongue/lip twitching following the oxaliplatin with cycle 2 FOLFIRINOX. Oxaliplatin was dose reduced and given over 4 hours beginning with cycle 3. She had similar neurologic symptoms after cycle 4  3. Status post cholecystectomy 10/02/2013 with pathology revealing chronic cholecystitis. 4. Abdomen/back pain secondary to #1.  Improved 5. Hypertension. 6. Status post Port-A-Cath placement 10/28/2013. 7. History of elevated liver enzyme-improved. 8. Hypokalemia. Taking a potassium supplement  Disposition:  She continues to tolerate the FOLFIRINOX well. Her pain has improved since beginning chemotherapy. The plan is to proceed with cycle 5 of FOLFIRINOX today. She will return for an office visit in 2 weeks. She is scheduled for a restaging CT on 01/05/2014.   Betsy Coder, MD  12/24/2013  3:08 PM

## 2013-12-24 NOTE — Patient Instructions (Signed)
[facility] Discharge Instructions for Patients Receiving Chemotherapy  Today you received the following chemotherapy agents: Oxaliplatin, Leucovorin, Irinotecan, 5FU  To help prevent nausea and vomiting after your treatment, we encourage you to take your nausea medication as prescribed.    If you develop nausea and vomiting that is not controlled by your nausea medication, call the clinic.   BELOW ARE SYMPTOMS THAT SHOULD BE REPORTED IMMEDIATELY:  *FEVER GREATER THAN 100.5 F  *CHILLS WITH OR WITHOUT FEVER  NAUSEA AND VOMITING THAT IS NOT CONTROLLED WITH YOUR NAUSEA MEDICATION  *UNUSUAL SHORTNESS OF BREATH  *UNUSUAL BRUISING OR BLEEDING  TENDERNESS IN MOUTH AND THROAT WITH OR WITHOUT PRESENCE OF ULCERS  *URINARY PROBLEMS  *BOWEL PROBLEMS  UNUSUAL RASH Items with * indicate a potential emergency and should be followed up as soon as possible.  Feel free to call the clinic you have any questions or concerns. The clinic phone number is [phone].    

## 2013-12-24 NOTE — Progress Notes (Signed)
Followup completed with patient in the chemotherapy room.  She is feeling well.  She states her appetite and oral intake has improved.  Weight is up just slightly at 206.3 pounds from 205 pounds on December 30.  Nutrition diagnosis: Food and nutrition related knowledge deficit and unintended weight loss improved.  Intervention: Patient to continue strategies for adequate oral intake for weight stabilization.  Monitoring, evaluation, goals: Patient is tolerating adequate calories and protein.  Next visit: Thursday, January 29, during chemotherapy.

## 2013-12-26 ENCOUNTER — Ambulatory Visit (HOSPITAL_BASED_OUTPATIENT_CLINIC_OR_DEPARTMENT_OTHER): Payer: 59

## 2013-12-26 VITALS — BP 143/92 | HR 86 | Temp 97.3°F | Resp 17

## 2013-12-26 DIAGNOSIS — Z452 Encounter for adjustment and management of vascular access device: Secondary | ICD-10-CM

## 2013-12-26 DIAGNOSIS — C259 Malignant neoplasm of pancreas, unspecified: Secondary | ICD-10-CM

## 2013-12-26 MED ORDER — SODIUM CHLORIDE 0.9 % IJ SOLN
10.0000 mL | INTRAMUSCULAR | Status: DC | PRN
  Administered 2013-12-26: 10 mL
  Filled 2013-12-26: qty 10

## 2013-12-26 MED ORDER — HEPARIN SOD (PORK) LOCK FLUSH 100 UNIT/ML IV SOLN
500.0000 [IU] | Freq: Once | INTRAVENOUS | Status: AC | PRN
  Administered 2013-12-26: 500 [IU]
  Filled 2013-12-26: qty 5

## 2013-12-26 NOTE — Patient Instructions (Signed)

## 2013-12-29 ENCOUNTER — Other Ambulatory Visit: Payer: Self-pay | Admitting: *Deleted

## 2013-12-31 ENCOUNTER — Telehealth: Payer: Self-pay | Admitting: Oncology

## 2013-12-31 NOTE — Telephone Encounter (Signed)
Talked to pt and she is aware of CT appt , she also has oral contrast , also aware of appt with MD

## 2014-01-03 ENCOUNTER — Other Ambulatory Visit: Payer: Self-pay | Admitting: Oncology

## 2014-01-05 ENCOUNTER — Ambulatory Visit
Admission: RE | Admit: 2014-01-05 | Discharge: 2014-01-05 | Disposition: A | Discharge status: Home / Self Care | Payer: 59 | Source: Ambulatory Visit | Attending: Oncology | Admitting: Oncology

## 2014-01-05 DIAGNOSIS — C259 Malignant neoplasm of pancreas, unspecified: Secondary | ICD-10-CM

## 2014-01-05 MED ORDER — IOHEXOL 350 MG/ML SOLN
100.0000 mL | Freq: Once | INTRAVENOUS | Status: AC | PRN
  Administered 2014-01-05: 100 mL via INTRAVENOUS

## 2014-01-07 ENCOUNTER — Telehealth: Payer: Self-pay | Admitting: *Deleted

## 2014-01-07 ENCOUNTER — Ambulatory Visit: Payer: 59 | Admitting: Oncology

## 2014-01-07 ENCOUNTER — Ambulatory Visit: Payer: 59

## 2014-01-07 ENCOUNTER — Telehealth: Payer: Self-pay | Admitting: Oncology

## 2014-01-07 ENCOUNTER — Ambulatory Visit (HOSPITAL_BASED_OUTPATIENT_CLINIC_OR_DEPARTMENT_OTHER): Payer: 59 | Admitting: Oncology

## 2014-01-07 ENCOUNTER — Other Ambulatory Visit (HOSPITAL_BASED_OUTPATIENT_CLINIC_OR_DEPARTMENT_OTHER): Payer: 59

## 2014-01-07 ENCOUNTER — Encounter: Payer: 59 | Admitting: Nutrition

## 2014-01-07 ENCOUNTER — Other Ambulatory Visit: Payer: 59

## 2014-01-07 VITALS — BP 132/83 | HR 91 | Temp 98.5°F | Resp 18 | Ht 65.0 in | Wt 206.5 lb

## 2014-01-07 DIAGNOSIS — C259 Malignant neoplasm of pancreas, unspecified: Secondary | ICD-10-CM

## 2014-01-07 DIAGNOSIS — D702 Other drug-induced agranulocytosis: Secondary | ICD-10-CM

## 2014-01-07 DIAGNOSIS — C250 Malignant neoplasm of head of pancreas: Secondary | ICD-10-CM

## 2014-01-07 DIAGNOSIS — I10 Essential (primary) hypertension: Secondary | ICD-10-CM

## 2014-01-07 DIAGNOSIS — E876 Hypokalemia: Secondary | ICD-10-CM

## 2014-01-07 LAB — CBC WITH DIFFERENTIAL/PLATELET
BASO%: 0.7 % (ref 0.0–2.0)
BASOS ABS: 0 10*3/uL (ref 0.0–0.1)
EOS%: 1.2 % (ref 0.0–7.0)
Eosinophils Absolute: 0 10*3/uL (ref 0.0–0.5)
HEMATOCRIT: 33.2 % — AB (ref 34.8–46.6)
HEMOGLOBIN: 10.7 g/dL — AB (ref 11.6–15.9)
LYMPH#: 1.3 10*3/uL (ref 0.9–3.3)
LYMPH%: 44.8 % (ref 14.0–49.7)
MCH: 27 pg (ref 25.1–34.0)
MCHC: 32.2 g/dL (ref 31.5–36.0)
MCV: 83.8 fL (ref 79.5–101.0)
MONO#: 0.3 10*3/uL (ref 0.1–0.9)
MONO%: 10.9 % (ref 0.0–14.0)
NEUT#: 1.3 10*3/uL — ABNORMAL LOW (ref 1.5–6.5)
NEUT%: 42.4 % (ref 38.4–76.8)
PLATELETS: 184 10*3/uL (ref 145–400)
RBC: 3.97 10*6/uL (ref 3.70–5.45)
RDW: 16.1 % — ABNORMAL HIGH (ref 11.2–14.5)
WBC: 3 10*3/uL — ABNORMAL LOW (ref 3.9–10.3)

## 2014-01-07 LAB — COMPREHENSIVE METABOLIC PANEL (CC13)
ALT: 18 U/L (ref 0–55)
AST: 22 U/L (ref 5–34)
Albumin: 3.5 g/dL (ref 3.5–5.0)
Alkaline Phosphatase: 157 U/L — ABNORMAL HIGH (ref 40–150)
Anion Gap: 10 mEq/L (ref 3–11)
BILIRUBIN TOTAL: 0.42 mg/dL (ref 0.20–1.20)
BUN: 9.3 mg/dL (ref 7.0–26.0)
CALCIUM: 9.6 mg/dL (ref 8.4–10.4)
CHLORIDE: 105 meq/L (ref 98–109)
CO2: 27 mEq/L (ref 22–29)
CREATININE: 0.8 mg/dL (ref 0.6–1.1)
Glucose: 88 mg/dl (ref 70–140)
Potassium: 4 mEq/L (ref 3.5–5.1)
Sodium: 142 mEq/L (ref 136–145)
Total Protein: 6.7 g/dL (ref 6.4–8.3)

## 2014-01-07 NOTE — Telephone Encounter (Signed)
gave pt appt for lab and MD emaile michelle regarding chemo for February 2015

## 2014-01-07 NOTE — Progress Notes (Signed)
   [facility]    OFFICE PROGRESS NOTE   INTERVAL HISTORY:   Frances [name] returns for scheduled followup of pancreas cancer. She completed another cycle of FOLFIRINOX 12/24/2013. She again had twitching of the tongue on the day of chemotherapy. She continues to have cold sensitivity. No other neuropathy symptoms. No nausea/vomiting or diarrhea. Good appetite. No bowel pain.  Objective:  Vital signs in last 24 hours:  Blood pressure 132/83, pulse 91, temperature 98.5 F (36.9 C), temperature source Oral, resp. rate 18, height 5\' 5"  (1.651 m), weight 206 lb 8 oz (93.668 kg).    HEENT: No thrush or ulcers Resp: Lungs clear bilaterally Cardio: Regular rate and rhythm GI: No hepatosplenomegaly, nontender, no mass Vascular: No leg edema Neuro: Mild decrease in vibratory sense at the fingertips bilaterally    Portacath/PICC-without erythema  Lab Results:  Lab Results  Component Value Date   WBC 3.0* 01/07/2014   HGB 10.7* 01/07/2014   HCT 33.2* 01/07/2014   MCV 83.8 01/07/2014   PLT 184 01/07/2014   NEUTROABS 1.3* 01/07/2014   CEA on 12/09/2013-27.9   X-rays: CT abdomen on 01/05/2014, compared to 10/02/2013-a 10 mm lesion in the left liver may have increased slightly in size. 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 Indwelling biliary stent. Numerous small jejunal mesenteric lymph nodes.   Medications: I have reviewed the patient's current medications.  Assessment/Plan: 1. 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 Initiation of FOLFIRINOX 10/29/2013. She did not complete the 5-FU pump due to developing altered speech. Cycle 2 completed 11/12/2013. Cycle 3 completed 11/26/2013. Cycle 4 completed 12/09/2013, cycle 5 12/24/2013  Restaging CT 01/06/2012 with a slight increase in the size of  the pancreas mass and no clear evidence of distant metastatic disease 2. Altered speech prior to completion of cycle 1 of FOLFIRINOX, similar symptoms and tongue/lip twitching following the oxaliplatin with cycle 2 FOLFIRINOX. Oxaliplatin was dose reduced and given over 4 hours beginning with cycle 3. She had similar neurologic symptoms after subsequent cycles of FOLFIRINOX. 3. Status post cholecystectomy 10/02/2013 with pathology revealing chronic cholecystitis. 4. Abdomen/back pain secondary to #1. Improved 5. Hypertension. 6. Status post Port-A-Cath placement 10/28/2013. 7. History of elevated liver enzyme-improved. 8. History of Hypokalemia. Taking a potassium supplement 9. Neutropenia secondary to chemotherapy-we decided to hold chemotherapy today. She will return for cycle 6 of FOLFIRINOX with Neulasta support 01/14/2049   Disposition:  She has completed 5 cycles of FOLFIRINOX. Her performance status has improved since beginning chemotherapy. I reviewed the restaging CT films with Frances [name] and her husband. Her case was presented at the GI tumor conference on 01/06/2014. The tumor remains unresectable. The mass measured slightly larger on the 01/05/2014 CT, but there is no other clear evidence of disease progression.  We discussed treatment options. I recommend continuing FOLFIRINOX. Her pain and appetite have improved and she has not developed distant metastatic disease.   We discussed radiation and other chemotherapy options. Frances [name] will be referred to Dr. Reynaldo Minium for another opinion.  We reviewed the potential toxicities associated with Neulasta including the chance for bone pain, rash, and splenic rupture. She agrees to proceed with Neulasta following the next cycle of chemotherapy.   Betsy Coder, MD  01/07/2014  3:29 PM

## 2014-01-07 NOTE — Telephone Encounter (Signed)
Per staff message and POF I have scheduled appts.  JMW  

## 2014-01-07 NOTE — Progress Notes (Signed)
Met with patient to assess for needs.  She stated she is feeling better and is eating better.  She reports that she did obtain a handicap parking sticker which has helped her getting to and from car into her work.  Her husband is with her today and they both deny needs or barriers to care at present time.  Will continue to follow.

## 2014-01-08 ENCOUNTER — Telehealth: Payer: Self-pay | Admitting: Oncology

## 2014-01-08 ENCOUNTER — Telehealth: Payer: Self-pay | Admitting: Hematology and Oncology

## 2014-01-08 NOTE — Telephone Encounter (Signed)
PT APPT. WITH DR Reynaldo Minium @ [facility] IS 01/20/14@1 :00. MEDICAL RECORDS FAXED,SLIDES WILL BE FEDEX'ED. PT AWARE TO HAND CARRY CD TO APPT.

## 2014-01-11 ENCOUNTER — Telehealth: Payer: Self-pay | Admitting: Oncology

## 2014-01-11 NOTE — Telephone Encounter (Signed)
Called pt and left message regarding lab,chemo and MD for February, advised pt to get appt calendar

## 2014-01-14 ENCOUNTER — Ambulatory Visit: Payer: 59 | Admitting: Nutrition

## 2014-01-14 ENCOUNTER — Ambulatory Visit: Payer: 59

## 2014-01-14 ENCOUNTER — Ambulatory Visit (HOSPITAL_BASED_OUTPATIENT_CLINIC_OR_DEPARTMENT_OTHER): Payer: 59

## 2014-01-14 ENCOUNTER — Other Ambulatory Visit (HOSPITAL_BASED_OUTPATIENT_CLINIC_OR_DEPARTMENT_OTHER): Payer: 59

## 2014-01-14 VITALS — BP 129/89 | HR 91 | Temp 98.6°F | Resp 18

## 2014-01-14 DIAGNOSIS — Z5111 Encounter for antineoplastic chemotherapy: Secondary | ICD-10-CM

## 2014-01-14 DIAGNOSIS — C250 Malignant neoplasm of head of pancreas: Secondary | ICD-10-CM

## 2014-01-14 DIAGNOSIS — C259 Malignant neoplasm of pancreas, unspecified: Secondary | ICD-10-CM

## 2014-01-14 LAB — CBC WITH DIFFERENTIAL/PLATELET
BASO%: 0.7 % (ref 0.0–2.0)
Basophils Absolute: 0 10*3/uL (ref 0.0–0.1)
EOS ABS: 0 10*3/uL (ref 0.0–0.5)
EOS%: 0.8 % (ref 0.0–7.0)
HCT: 33.9 % — ABNORMAL LOW (ref 34.8–46.6)
HEMOGLOBIN: 10.8 g/dL — AB (ref 11.6–15.9)
LYMPH#: 2.2 10*3/uL (ref 0.9–3.3)
LYMPH%: 47.6 % (ref 14.0–49.7)
MCH: 27 pg (ref 25.1–34.0)
MCHC: 32 g/dL (ref 31.5–36.0)
MCV: 84.5 fL (ref 79.5–101.0)
MONO#: 0.5 10*3/uL (ref 0.1–0.9)
MONO%: 10.1 % (ref 0.0–14.0)
NEUT%: 40.8 % (ref 38.4–76.8)
NEUTROS ABS: 1.9 10*3/uL (ref 1.5–6.5)
Platelets: 223 10*3/uL (ref 145–400)
RBC: 4.02 10*6/uL (ref 3.70–5.45)
RDW: 16.9 % — ABNORMAL HIGH (ref 11.2–14.5)
WBC: 4.6 10*3/uL (ref 3.9–10.3)

## 2014-01-14 MED ORDER — ONDANSETRON 16 MG/50ML IVPB (CHCC)
16.0000 mg | Freq: Once | INTRAVENOUS | Status: AC
  Administered 2014-01-14: 16 mg via INTRAVENOUS

## 2014-01-14 MED ORDER — DEXTROSE 5 % IV SOLN
Freq: Once | INTRAVENOUS | Status: AC
  Administered 2014-01-14: 09:00:00 via INTRAVENOUS

## 2014-01-14 MED ORDER — ATROPINE SULFATE 1 MG/ML IJ SOLN
0.5000 mg | Freq: Once | INTRAMUSCULAR | Status: AC | PRN
  Administered 2014-01-14: 0.5 mg via INTRAVENOUS

## 2014-01-14 MED ORDER — DEXAMETHASONE SODIUM PHOSPHATE 20 MG/5ML IJ SOLN
20.0000 mg | Freq: Once | INTRAMUSCULAR | Status: AC
  Administered 2014-01-14: 20 mg via INTRAVENOUS

## 2014-01-14 MED ORDER — IRINOTECAN HCL CHEMO INJECTION 100 MG/5ML
380.0000 mg | Freq: Once | INTRAVENOUS | Status: AC
  Administered 2014-01-14: 380 mg via INTRAVENOUS
  Filled 2014-01-14: qty 19

## 2014-01-14 MED ORDER — SODIUM CHLORIDE 0.9 % IV SOLN
2400.0000 mg/m2 | INTRAVENOUS | Status: DC
  Administered 2014-01-14: 5100 mg via INTRAVENOUS
  Filled 2014-01-14: qty 102

## 2014-01-14 MED ORDER — DEXAMETHASONE SODIUM PHOSPHATE 20 MG/5ML IJ SOLN
INTRAMUSCULAR | Status: AC
  Filled 2014-01-14: qty 5

## 2014-01-14 MED ORDER — ATROPINE SULFATE 1 MG/ML IJ SOLN
INTRAMUSCULAR | Status: AC
  Filled 2014-01-14: qty 1

## 2014-01-14 MED ORDER — LEUCOVORIN CALCIUM INJECTION 350 MG
400.0000 mg/m2 | Freq: Once | INTRAVENOUS | Status: AC
  Administered 2014-01-14: 848 mg via INTRAVENOUS
  Filled 2014-01-14: qty 42.4

## 2014-01-14 MED ORDER — ONDANSETRON 16 MG/50ML IVPB (CHCC)
INTRAVENOUS | Status: AC
  Filled 2014-01-14: qty 16

## 2014-01-14 MED ORDER — OXALIPLATIN CHEMO INJECTION 100 MG/20ML
65.0000 mg/m2 | Freq: Once | INTRAVENOUS | Status: AC
  Administered 2014-01-14: 140 mg via INTRAVENOUS
  Filled 2014-01-14: qty 28

## 2014-01-14 NOTE — Patient Instructions (Signed)
[facility] Discharge Instructions for Patients Receiving Chemotherapy  Today you received the following chemotherapy agents: Oxaliplatin, Leucovorin, Irinotecan, 5FU  To help prevent nausea and vomiting after your treatment, we encourage you to take your nausea medication as prescribed.    If you develop nausea and vomiting that is not controlled by your nausea medication, call the clinic.   BELOW ARE SYMPTOMS THAT SHOULD BE REPORTED IMMEDIATELY:  *FEVER GREATER THAN 100.5 F  *CHILLS WITH OR WITHOUT FEVER  NAUSEA AND VOMITING THAT IS NOT CONTROLLED WITH YOUR NAUSEA MEDICATION  *UNUSUAL SHORTNESS OF BREATH  *UNUSUAL BRUISING OR BLEEDING  TENDERNESS IN MOUTH AND THROAT WITH OR WITHOUT PRESENCE OF ULCERS  *URINARY PROBLEMS  *BOWEL PROBLEMS  UNUSUAL RASH Items with * indicate a potential emergency and should be followed up as soon as possible.  Feel free to call the clinic you have any questions or concerns. The clinic phone number is [phone].

## 2014-01-14 NOTE — Progress Notes (Signed)
Brief followup completed with patient.  She continues to eat well.  Weight is stable and was documented as 206.5 pounds January 29.  She has no nutrition side effects.  Nutrition diagnosis of food and nutrition related knowledge deficit and unintended weight loss resolved.  Patient was educated to contact me if she develops new nutrition impact symptoms.  She has my contact information for questions or concerns.

## 2014-01-16 ENCOUNTER — Ambulatory Visit (HOSPITAL_BASED_OUTPATIENT_CLINIC_OR_DEPARTMENT_OTHER): Payer: 59

## 2014-01-16 ENCOUNTER — Ambulatory Visit: Payer: 59

## 2014-01-16 DIAGNOSIS — D702 Other drug-induced agranulocytosis: Secondary | ICD-10-CM

## 2014-01-16 MED ORDER — PEGFILGRASTIM INJECTION 6 MG/0.6ML
6.0000 mg | Freq: Once | SUBCUTANEOUS | Status: AC
  Administered 2014-01-16: 6 mg via SUBCUTANEOUS

## 2014-01-16 MED ORDER — HEPARIN SOD (PORK) LOCK FLUSH 100 UNIT/ML IV SOLN
500.0000 [IU] | Freq: Once | INTRAVENOUS | Status: AC | PRN
  Administered 2014-01-16: 500 [IU]
  Filled 2014-01-16: qty 5

## 2014-01-16 MED ORDER — SODIUM CHLORIDE 0.9 % IJ SOLN
10.0000 mL | INTRAMUSCULAR | Status: DC | PRN
  Administered 2014-01-16: 10 mL
  Filled 2014-01-16: qty 10

## 2014-01-24 ENCOUNTER — Other Ambulatory Visit: Payer: Self-pay | Admitting: Oncology

## 2014-01-28 ENCOUNTER — Other Ambulatory Visit (HOSPITAL_BASED_OUTPATIENT_CLINIC_OR_DEPARTMENT_OTHER): Payer: 59

## 2014-01-28 ENCOUNTER — Ambulatory Visit (HOSPITAL_BASED_OUTPATIENT_CLINIC_OR_DEPARTMENT_OTHER): Payer: 59

## 2014-01-28 ENCOUNTER — Ambulatory Visit (HOSPITAL_BASED_OUTPATIENT_CLINIC_OR_DEPARTMENT_OTHER): Payer: 59 | Admitting: Oncology

## 2014-01-28 ENCOUNTER — Encounter: Payer: 59 | Admitting: Nutrition

## 2014-01-28 ENCOUNTER — Other Ambulatory Visit: Payer: Self-pay | Admitting: *Deleted

## 2014-01-28 ENCOUNTER — Telehealth: Payer: Self-pay | Admitting: Oncology

## 2014-01-28 VITALS — BP 125/91 | HR 84 | Temp 97.9°F | Resp 18 | Ht 65.0 in | Wt 207.2 lb

## 2014-01-28 DIAGNOSIS — C250 Malignant neoplasm of head of pancreas: Secondary | ICD-10-CM

## 2014-01-28 DIAGNOSIS — C259 Malignant neoplasm of pancreas, unspecified: Secondary | ICD-10-CM

## 2014-01-28 DIAGNOSIS — Z5111 Encounter for antineoplastic chemotherapy: Secondary | ICD-10-CM

## 2014-01-28 DIAGNOSIS — E876 Hypokalemia: Secondary | ICD-10-CM

## 2014-01-28 LAB — CBC WITH DIFFERENTIAL/PLATELET
BASO%: 0.5 % (ref 0.0–2.0)
Basophils Absolute: 0 10*3/uL (ref 0.0–0.1)
EOS ABS: 0 10*3/uL (ref 0.0–0.5)
EOS%: 0.8 % (ref 0.0–7.0)
HEMATOCRIT: 34.9 % (ref 34.8–46.6)
HGB: 11.1 g/dL — ABNORMAL LOW (ref 11.6–15.9)
LYMPH#: 2.3 10*3/uL (ref 0.9–3.3)
LYMPH%: 44 % (ref 14.0–49.7)
MCH: 26.7 pg (ref 25.1–34.0)
MCHC: 31.7 g/dL (ref 31.5–36.0)
MCV: 84.2 fL (ref 79.5–101.0)
MONO#: 0.4 10*3/uL (ref 0.1–0.9)
MONO%: 8.7 % (ref 0.0–14.0)
NEUT%: 46 % (ref 38.4–76.8)
NEUTROS ABS: 2.4 10*3/uL (ref 1.5–6.5)
Platelets: 159 10*3/uL (ref 145–400)
RBC: 4.15 10*6/uL (ref 3.70–5.45)
RDW: 17.2 % — ABNORMAL HIGH (ref 11.2–14.5)
WBC: 5.1 10*3/uL (ref 3.9–10.3)

## 2014-01-28 LAB — COMPREHENSIVE METABOLIC PANEL (CC13)
ALBUMIN: 3.6 g/dL (ref 3.5–5.0)
ALK PHOS: 162 U/L — AB (ref 40–150)
ALT: 16 U/L (ref 0–55)
AST: 20 U/L (ref 5–34)
Anion Gap: 9 mEq/L (ref 3–11)
BUN: 8.1 mg/dL (ref 7.0–26.0)
CO2: 26 mEq/L (ref 22–29)
Calcium: 9.5 mg/dL (ref 8.4–10.4)
Chloride: 106 mEq/L (ref 98–109)
Creatinine: 0.9 mg/dL (ref 0.6–1.1)
Glucose: 114 mg/dl (ref 70–140)
POTASSIUM: 3.6 meq/L (ref 3.5–5.1)
SODIUM: 141 meq/L (ref 136–145)
TOTAL PROTEIN: 6.6 g/dL (ref 6.4–8.3)
Total Bilirubin: 0.26 mg/dL (ref 0.20–1.20)

## 2014-01-28 LAB — CEA: CEA: 9.8 ng/mL — AB (ref 0.0–5.0)

## 2014-01-28 MED ORDER — SODIUM CHLORIDE 0.9 % IV SOLN
2400.0000 mg/m2 | INTRAVENOUS | Status: DC
  Administered 2014-01-28: 5100 mg via INTRAVENOUS
  Filled 2014-01-28: qty 102

## 2014-01-28 MED ORDER — DEXTROSE 5 % IV SOLN
65.0000 mg/m2 | Freq: Once | INTRAVENOUS | Status: AC
  Administered 2014-01-28: 140 mg via INTRAVENOUS
  Filled 2014-01-28: qty 28

## 2014-01-28 MED ORDER — DEXAMETHASONE SODIUM PHOSPHATE 20 MG/5ML IJ SOLN
20.0000 mg | Freq: Once | INTRAMUSCULAR | Status: AC
  Administered 2014-01-28: 20 mg via INTRAVENOUS

## 2014-01-28 MED ORDER — DEXAMETHASONE SODIUM PHOSPHATE 20 MG/5ML IJ SOLN
INTRAMUSCULAR | Status: AC
  Filled 2014-01-28: qty 5

## 2014-01-28 MED ORDER — ONDANSETRON 16 MG/50ML IVPB (CHCC)
16.0000 mg | Freq: Once | INTRAVENOUS | Status: AC
  Administered 2014-01-28: 16 mg via INTRAVENOUS

## 2014-01-28 MED ORDER — ATROPINE SULFATE 1 MG/ML IJ SOLN
INTRAMUSCULAR | Status: AC
  Filled 2014-01-28: qty 1

## 2014-01-28 MED ORDER — ATROPINE SULFATE 1 MG/ML IJ SOLN
0.5000 mg | Freq: Once | INTRAMUSCULAR | Status: AC | PRN
  Administered 2014-01-28: 0.5 mg via INTRAVENOUS

## 2014-01-28 MED ORDER — IRINOTECAN HCL CHEMO INJECTION 100 MG/5ML
380.0000 mg | Freq: Once | INTRAVENOUS | Status: AC
  Administered 2014-01-28: 380 mg via INTRAVENOUS
  Filled 2014-01-28: qty 19

## 2014-01-28 MED ORDER — DEXTROSE 5 % IV SOLN
Freq: Once | INTRAVENOUS | Status: AC
  Administered 2014-01-28: 10:00:00 via INTRAVENOUS

## 2014-01-28 MED ORDER — ONDANSETRON 16 MG/50ML IVPB (CHCC)
INTRAVENOUS | Status: AC
  Filled 2014-01-28: qty 16

## 2014-01-28 MED ORDER — LEUCOVORIN CALCIUM INJECTION 350 MG
400.0000 mg/m2 | Freq: Once | INTRAVENOUS | Status: AC
  Administered 2014-01-28: 848 mg via INTRAVENOUS
  Filled 2014-01-28: qty 42.4

## 2014-01-28 NOTE — Telephone Encounter (Signed)
gv pt appt schedule for feb/march °

## 2014-01-28 NOTE — Patient Instructions (Signed)
[facility] Discharge Instructions for Patients Receiving Chemotherapy  Today you received the following chemotherapy agents oxaliplatin, 38fu, leucovorin and irinotecan.  To help prevent nausea and vomiting after your treatment, we encourage you to take your nausea medication, phenergan.   If you develop nausea and vomiting that is not controlled by your nausea medication, call the clinic.   BELOW ARE SYMPTOMS THAT SHOULD BE REPORTED IMMEDIATELY:  *FEVER GREATER THAN 100.5 F  *CHILLS WITH OR WITHOUT FEVER  NAUSEA AND VOMITING THAT IS NOT CONTROLLED WITH YOUR NAUSEA MEDICATION  *UNUSUAL SHORTNESS OF BREATH  *UNUSUAL BRUISING OR BLEEDING  TENDERNESS IN MOUTH AND THROAT WITH OR WITHOUT PRESENCE OF ULCERS  *URINARY PROBLEMS  *BOWEL PROBLEMS  UNUSUAL RASH Items with * indicate a potential emergency and should be followed up as soon as possible.  Feel free to call the clinic you have any questions or concerns. The clinic phone number is [phone].

## 2014-01-28 NOTE — Progress Notes (Signed)
   Frances [name]    OFFICE PROGRESS NOTE   INTERVAL HISTORY:   [name] returns for scheduled followup of pancreas cancer. She completed another cycle of FOLFIRINOX on 01/14/2014. She developed abdominal cramping and diarrhea during the chemotherapy infusion. She had not received atropine. She reports less tongue twitching with this cycle. She had cold sensitivity following chemotherapy. No numbness at present. No abdominal pain. Good appetite.  Objective:  Vital signs in last 24 hours:  Blood pressure 125/91, pulse 84, temperature 97.9 F (36.6 C), temperature source Oral, resp. rate 18, height 5\' 5"  (1.651 m), weight 207 lb 3.2 oz (93.985 kg), SpO2 100.00%.    HEENT: No thrush or ulcers Resp: Lungs clear bilaterally Cardio: Regular rate and rhythm GI: No hepatomegaly, no mass, nontender Vascular: No leg edema Neuro: The vibratory sense is intact at the fingertips bilaterally  Skin: Mild hyperpigmentation over the hands   Portacath/PICC-without erythema  Lab Results:  Lab Results  Component Value Date   WBC 5.1 01/28/2014   HGB 11.1* 01/28/2014   HCT 34.9 01/28/2014   MCV 84.2 01/28/2014   PLT 159 01/28/2014   NEUTROABS 2.4 01/28/2014      Medications: I have reviewed the patient's current medications.  Assessment/Plan: 1. 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 Initiation of FOLFIRINOX 10/29/2013. She did not complete the 5-FU pump due to developing altered speech. Cycle 2 completed 11/12/2013. Cycle 3 completed 11/26/2013. Cycle 4 completed 12/09/2013, cycle 5 12/24/2013, cycle 6 01/14/2014, cycle 7 01/28/2014 Restaging CT 01/06/2012 with a slight increase in the size of the pancreas mass and no clear evidence of distant metastatic disease 2. Altered speech prior to completion of cycle 1 of FOLFIRINOX, similar symptoms and tongue/lip twitching following the oxaliplatin with cycle 2 FOLFIRINOX. Oxaliplatin was dose reduced and given over 4  hours beginning with cycle 3. She had similar neurologic symptoms after subsequent cycles of FOLFIRINOX. 3. Status post cholecystectomy 10/02/2013 with pathology revealing chronic cholecystitis. 4. Abdomen/back pain secondary to #1. Improved 5. Hypertension. 6. Status post Port-A-Cath placement 10/28/2013. 7. History of elevated liver enzyme-improved. 8. History of Hypokalemia. Taking a potassium supplement       9.   History of Neutropenia secondary to chemotherapy-Neulasta was added beginning with cycle 6 on 01/14/2014      Disposition:  [name] appears stable. She continues to tolerate the FOLFIRINOX well. She saw Dr. Reynaldo Minium for a second oncology opinion. She was also evaluated by a surgeon at [facility]. They agree the tumor is unresectable. I discussed the case with Dr. Reynaldo Minium.  The plan is to continue FOLFIRINOX for several more cycles. We will then consider dropping the oxaliplatin and continuing FOLFIRI as tolerated .  [name] will receive atropine with chemotherapy today. She will return for an office visit and cycle 8 of FOLFIRINOX in 2 weeks.   Betsy Coder, MD  01/28/2014  5:46 PM

## 2014-01-30 ENCOUNTER — Ambulatory Visit (HOSPITAL_BASED_OUTPATIENT_CLINIC_OR_DEPARTMENT_OTHER): Payer: 59

## 2014-01-30 VITALS — BP 145/86 | HR 83 | Temp 98.2°F

## 2014-01-30 DIAGNOSIS — Z5189 Encounter for other specified aftercare: Secondary | ICD-10-CM

## 2014-01-30 DIAGNOSIS — C259 Malignant neoplasm of pancreas, unspecified: Secondary | ICD-10-CM

## 2014-01-30 DIAGNOSIS — C250 Malignant neoplasm of head of pancreas: Secondary | ICD-10-CM

## 2014-01-30 MED ORDER — SODIUM CHLORIDE 0.9 % IJ SOLN
10.0000 mL | INTRAMUSCULAR | Status: DC | PRN
  Administered 2014-01-30: 10 mL
  Filled 2014-01-30: qty 10

## 2014-01-30 MED ORDER — HEPARIN SOD (PORK) LOCK FLUSH 100 UNIT/ML IV SOLN
500.0000 [IU] | Freq: Once | INTRAVENOUS | Status: AC | PRN
  Administered 2014-01-30: 500 [IU]
  Filled 2014-01-30: qty 5

## 2014-01-30 MED ORDER — PEGFILGRASTIM INJECTION 6 MG/0.6ML
6.0000 mg | Freq: Once | SUBCUTANEOUS | Status: AC
  Administered 2014-01-30: 6 mg via SUBCUTANEOUS

## 2014-01-30 NOTE — Progress Notes (Signed)
1333-2.2 ml bolus prior to pump d/c.

## 2014-01-30 NOTE — Patient Instructions (Signed)

## 2014-02-07 ENCOUNTER — Other Ambulatory Visit: Payer: Self-pay | Admitting: Oncology

## 2014-02-11 ENCOUNTER — Ambulatory Visit (HOSPITAL_BASED_OUTPATIENT_CLINIC_OR_DEPARTMENT_OTHER): Payer: 59

## 2014-02-11 ENCOUNTER — Ambulatory Visit (HOSPITAL_BASED_OUTPATIENT_CLINIC_OR_DEPARTMENT_OTHER): Payer: 59 | Admitting: Oncology

## 2014-02-11 VITALS — BP 145/82 | HR 72 | Temp 97.8°F | Resp 18

## 2014-02-11 DIAGNOSIS — C252 Malignant neoplasm of tail of pancreas: Secondary | ICD-10-CM

## 2014-02-11 DIAGNOSIS — Z5111 Encounter for antineoplastic chemotherapy: Secondary | ICD-10-CM

## 2014-02-11 DIAGNOSIS — C259 Malignant neoplasm of pancreas, unspecified: Secondary | ICD-10-CM

## 2014-02-11 LAB — COMPREHENSIVE METABOLIC PANEL (CC13)
ALK PHOS: 169 U/L — AB (ref 40–150)
ALT: 19 U/L (ref 0–55)
AST: 23 U/L (ref 5–34)
Albumin: 3.5 g/dL (ref 3.5–5.0)
Anion Gap: 9 mEq/L (ref 3–11)
BILIRUBIN TOTAL: 0.33 mg/dL (ref 0.20–1.20)
BUN: 8.3 mg/dL (ref 7.0–26.0)
CO2: 26 mEq/L (ref 22–29)
Calcium: 9.3 mg/dL (ref 8.4–10.4)
Chloride: 108 mEq/L (ref 98–109)
Creatinine: 0.8 mg/dL (ref 0.6–1.1)
GLUCOSE: 99 mg/dL (ref 70–140)
Potassium: 3.4 mEq/L — ABNORMAL LOW (ref 3.5–5.1)
SODIUM: 144 meq/L (ref 136–145)
Total Protein: 6.4 g/dL (ref 6.4–8.3)

## 2014-02-11 LAB — CBC WITH DIFFERENTIAL/PLATELET
BASO%: 0.5 % (ref 0.0–2.0)
BASOS ABS: 0 10*3/uL (ref 0.0–0.1)
EOS%: 1.4 % (ref 0.0–7.0)
Eosinophils Absolute: 0.1 10*3/uL (ref 0.0–0.5)
HCT: 33.7 % — ABNORMAL LOW (ref 34.8–46.6)
HGB: 10.7 g/dL — ABNORMAL LOW (ref 11.6–15.9)
LYMPH%: 32.7 % (ref 14.0–49.7)
MCH: 26.8 pg (ref 25.1–34.0)
MCHC: 31.8 g/dL (ref 31.5–36.0)
MCV: 84.1 fL (ref 79.5–101.0)
MONO#: 0.6 10*3/uL (ref 0.1–0.9)
MONO%: 9.2 % (ref 0.0–14.0)
NEUT#: 3.6 10*3/uL (ref 1.5–6.5)
NEUT%: 56.2 % (ref 38.4–76.8)
Platelets: 199 10*3/uL (ref 145–400)
RBC: 4.01 10*6/uL (ref 3.70–5.45)
RDW: 18 % — ABNORMAL HIGH (ref 11.2–14.5)
WBC: 6.4 10*3/uL (ref 3.9–10.3)
lymph#: 2.1 10*3/uL (ref 0.9–3.3)

## 2014-02-11 MED ORDER — DEXTROSE 5 % IV SOLN
Freq: Once | INTRAVENOUS | Status: AC
  Administered 2014-02-11: 09:00:00 via INTRAVENOUS

## 2014-02-11 MED ORDER — ONDANSETRON 16 MG/50ML IVPB (CHCC)
16.0000 mg | Freq: Once | INTRAVENOUS | Status: AC
  Administered 2014-02-11: 16 mg via INTRAVENOUS

## 2014-02-11 MED ORDER — ONDANSETRON 16 MG/50ML IVPB (CHCC)
INTRAVENOUS | Status: AC
  Filled 2014-02-11: qty 16

## 2014-02-11 MED ORDER — OXALIPLATIN CHEMO INJECTION 100 MG/20ML
65.0000 mg/m2 | Freq: Once | INTRAVENOUS | Status: AC
  Administered 2014-02-11: 140 mg via INTRAVENOUS
  Filled 2014-02-11: qty 28

## 2014-02-11 MED ORDER — SODIUM CHLORIDE 0.9 % IV SOLN
2400.0000 mg/m2 | INTRAVENOUS | Status: DC
  Administered 2014-02-11: 5100 mg via INTRAVENOUS
  Filled 2014-02-11: qty 102

## 2014-02-11 MED ORDER — IRINOTECAN HCL CHEMO INJECTION 100 MG/5ML
180.0000 mg/m2 | Freq: Once | INTRAVENOUS | Status: AC
  Administered 2014-02-11: 382 mg via INTRAVENOUS
  Filled 2014-02-11: qty 19.1

## 2014-02-11 MED ORDER — LEUCOVORIN CALCIUM INJECTION 350 MG
400.0000 mg/m2 | Freq: Once | INTRAVENOUS | Status: AC
  Administered 2014-02-11: 848 mg via INTRAVENOUS
  Filled 2014-02-11: qty 42.4

## 2014-02-11 MED ORDER — DEXAMETHASONE SODIUM PHOSPHATE 20 MG/5ML IJ SOLN
20.0000 mg | Freq: Once | INTRAMUSCULAR | Status: AC
  Administered 2014-02-11: 20 mg via INTRAVENOUS

## 2014-02-11 MED ORDER — ATROPINE SULFATE 1 MG/ML IJ SOLN
0.5000 mg | Freq: Once | INTRAMUSCULAR | Status: AC | PRN
  Administered 2014-02-11: 0.5 mg via INTRAVENOUS

## 2014-02-11 MED ORDER — ATROPINE SULFATE 1 MG/ML IJ SOLN
INTRAMUSCULAR | Status: AC
  Filled 2014-02-11: qty 1

## 2014-02-11 MED ORDER — DEXAMETHASONE SODIUM PHOSPHATE 20 MG/5ML IJ SOLN
INTRAMUSCULAR | Status: AC
  Filled 2014-02-11: qty 5

## 2014-02-11 NOTE — Patient Instructions (Signed)
[facility] Discharge Instructions for Patients Receiving Chemotherapy  Today you received the following chemotherapy agents: Oxaliplatin, Leucovorin, Irinotecan, 5FU ( Adrucil).  To help prevent nausea and vomiting after your treatment, we encourage you to take your nausea medication as prescribed by your physician: Phenergan 25 mg every 6 hrs as needed.   If you develop nausea and vomiting that is not controlled by your nausea medication, call the clinic.   BELOW ARE SYMPTOMS THAT SHOULD BE REPORTED IMMEDIATELY:  *FEVER GREATER THAN 100.5 F  *CHILLS WITH OR WITHOUT FEVER  NAUSEA AND VOMITING THAT IS NOT CONTROLLED WITH YOUR NAUSEA MEDICATION  *UNUSUAL SHORTNESS OF BREATH  *UNUSUAL BRUISING OR BLEEDING  TENDERNESS IN MOUTH AND THROAT WITH OR WITHOUT PRESENCE OF ULCERS  *URINARY PROBLEMS  *BOWEL PROBLEMS  UNUSUAL RASH Items with * indicate a potential emergency and should be followed up as soon as possible.  Feel free to call the clinic you have any questions or concerns. The clinic phone number is [phone].

## 2014-02-11 NOTE — Progress Notes (Signed)
1210- Patient's Oxaliplatin was mixed in 500 ml bag instead of 1L bag, was infused at correct rate with correct dosage. Dr. Benay Spice notified of this, states to just monitor patient and ensure VS are stable. Patient tolerated oxaliplatin very well, slight tingling around mouth but this is normal for her. Pharmacy made aware of volume discrepancy. Patient to report any changes during treatment.   1445- VS/patient stable, no questions or concerns at this time. tingling around mouth has not increased, and speech is clear. Patient is to call and report any changes

## 2014-02-13 ENCOUNTER — Ambulatory Visit (HOSPITAL_BASED_OUTPATIENT_CLINIC_OR_DEPARTMENT_OTHER): Payer: 59

## 2014-02-13 ENCOUNTER — Ambulatory Visit: Payer: 59

## 2014-02-13 DIAGNOSIS — C250 Malignant neoplasm of head of pancreas: Secondary | ICD-10-CM

## 2014-02-13 DIAGNOSIS — Z5189 Encounter for other specified aftercare: Secondary | ICD-10-CM

## 2014-02-13 MED ORDER — SODIUM CHLORIDE 0.9 % IJ SOLN
10.0000 mL | INTRAMUSCULAR | Status: DC | PRN
  Administered 2014-02-13: 10 mL
  Filled 2014-02-13: qty 10

## 2014-02-13 MED ORDER — HEPARIN SOD (PORK) LOCK FLUSH 100 UNIT/ML IV SOLN
500.0000 [IU] | Freq: Once | INTRAVENOUS | Status: AC | PRN
  Administered 2014-02-13: 500 [IU]
  Filled 2014-02-13: qty 5

## 2014-02-13 MED ORDER — PEGFILGRASTIM INJECTION 6 MG/0.6ML
6.0000 mg | Freq: Once | SUBCUTANEOUS | Status: AC
  Administered 2014-02-13: 6 mg via SUBCUTANEOUS

## 2014-02-21 ENCOUNTER — Other Ambulatory Visit: Payer: Self-pay | Admitting: Oncology

## 2014-02-25 ENCOUNTER — Ambulatory Visit (HOSPITAL_BASED_OUTPATIENT_CLINIC_OR_DEPARTMENT_OTHER): Payer: 59

## 2014-02-25 ENCOUNTER — Other Ambulatory Visit (HOSPITAL_BASED_OUTPATIENT_CLINIC_OR_DEPARTMENT_OTHER): Payer: 59

## 2014-02-25 ENCOUNTER — Telehealth: Payer: Self-pay | Admitting: Oncology

## 2014-02-25 ENCOUNTER — Ambulatory Visit (HOSPITAL_BASED_OUTPATIENT_CLINIC_OR_DEPARTMENT_OTHER): Payer: 59 | Admitting: Nurse Practitioner

## 2014-02-25 VITALS — BP 141/94 | HR 89 | Temp 98.0°F | Resp 18 | Ht 65.0 in | Wt 211.2 lb

## 2014-02-25 DIAGNOSIS — C250 Malignant neoplasm of head of pancreas: Secondary | ICD-10-CM

## 2014-02-25 DIAGNOSIS — E876 Hypokalemia: Secondary | ICD-10-CM

## 2014-02-25 DIAGNOSIS — Z5111 Encounter for antineoplastic chemotherapy: Secondary | ICD-10-CM

## 2014-02-25 DIAGNOSIS — C259 Malignant neoplasm of pancreas, unspecified: Secondary | ICD-10-CM

## 2014-02-25 DIAGNOSIS — I10 Essential (primary) hypertension: Secondary | ICD-10-CM

## 2014-02-25 LAB — CBC WITH DIFFERENTIAL/PLATELET
BASO%: 0.3 % (ref 0.0–2.0)
Basophils Absolute: 0 10*3/uL (ref 0.0–0.1)
EOS ABS: 0.1 10*3/uL (ref 0.0–0.5)
EOS%: 0.9 % (ref 0.0–7.0)
HCT: 33.5 % — ABNORMAL LOW (ref 34.8–46.6)
HGB: 10.7 g/dL — ABNORMAL LOW (ref 11.6–15.9)
LYMPH#: 1.8 10*3/uL (ref 0.9–3.3)
LYMPH%: 25.9 % (ref 14.0–49.7)
MCH: 26.9 pg (ref 25.1–34.0)
MCHC: 32 g/dL (ref 31.5–36.0)
MCV: 84.1 fL (ref 79.5–101.0)
MONO#: 0.7 10*3/uL (ref 0.1–0.9)
MONO%: 10 % (ref 0.0–14.0)
NEUT#: 4.3 10*3/uL (ref 1.5–6.5)
NEUT%: 62.9 % (ref 38.4–76.8)
Platelets: 171 10*3/uL (ref 145–400)
RBC: 3.99 10*6/uL (ref 3.70–5.45)
RDW: 17.8 % — AB (ref 11.2–14.5)
WBC: 6.9 10*3/uL (ref 3.9–10.3)

## 2014-02-25 LAB — COMPREHENSIVE METABOLIC PANEL (CC13)
ALBUMIN: 3.5 g/dL (ref 3.5–5.0)
ALT: 23 U/L (ref 0–55)
AST: 29 U/L (ref 5–34)
Alkaline Phosphatase: 184 U/L — ABNORMAL HIGH (ref 40–150)
Anion Gap: 8 mEq/L (ref 3–11)
BUN: 9.8 mg/dL (ref 7.0–26.0)
CO2: 26 meq/L (ref 22–29)
Calcium: 9.4 mg/dL (ref 8.4–10.4)
Chloride: 105 mEq/L (ref 98–109)
Creatinine: 0.9 mg/dL (ref 0.6–1.1)
Glucose: 100 mg/dl (ref 70–140)
POTASSIUM: 4 meq/L (ref 3.5–5.1)
Sodium: 139 mEq/L (ref 136–145)
Total Bilirubin: 0.26 mg/dL (ref 0.20–1.20)
Total Protein: 6.6 g/dL (ref 6.4–8.3)

## 2014-02-25 MED ORDER — ATROPINE SULFATE 1 MG/ML IJ SOLN
INTRAMUSCULAR | Status: AC
  Filled 2014-02-25: qty 1

## 2014-02-25 MED ORDER — PALONOSETRON HCL INJECTION 0.25 MG/5ML
INTRAVENOUS | Status: AC
  Filled 2014-02-25: qty 5

## 2014-02-25 MED ORDER — DEXAMETHASONE SODIUM PHOSPHATE 20 MG/5ML IJ SOLN
INTRAMUSCULAR | Status: AC
  Filled 2014-02-25: qty 5

## 2014-02-25 MED ORDER — OXALIPLATIN CHEMO INJECTION 100 MG/20ML
65.0000 mg/m2 | Freq: Once | INTRAVENOUS | Status: AC
  Administered 2014-02-25: 140 mg via INTRAVENOUS
  Filled 2014-02-25: qty 28

## 2014-02-25 MED ORDER — DEXTROSE 5 % IV SOLN
Freq: Once | INTRAVENOUS | Status: AC
  Administered 2014-02-25: 10:00:00 via INTRAVENOUS

## 2014-02-25 MED ORDER — PALONOSETRON HCL INJECTION 0.25 MG/5ML
0.2500 mg | Freq: Once | INTRAVENOUS | Status: AC
  Administered 2014-02-25: 0.25 mg via INTRAVENOUS

## 2014-02-25 MED ORDER — IRINOTECAN HCL CHEMO INJECTION 100 MG/5ML
180.0000 mg/m2 | Freq: Once | INTRAVENOUS | Status: AC
  Administered 2014-02-25: 380 mg via INTRAVENOUS
  Filled 2014-02-25: qty 19

## 2014-02-25 MED ORDER — ATROPINE SULFATE 1 MG/ML IJ SOLN
0.5000 mg | Freq: Once | INTRAMUSCULAR | Status: AC | PRN
  Administered 2014-02-25: 0.5 mg via INTRAVENOUS

## 2014-02-25 MED ORDER — DEXAMETHASONE SODIUM PHOSPHATE 20 MG/5ML IJ SOLN
20.0000 mg | Freq: Once | INTRAMUSCULAR | Status: AC
  Administered 2014-02-25: 20 mg via INTRAVENOUS

## 2014-02-25 MED ORDER — LEUCOVORIN CALCIUM INJECTION 350 MG
850.0000 mg | Freq: Once | INTRAVENOUS | Status: AC
  Administered 2014-02-25: 850 mg via INTRAVENOUS
  Filled 2014-02-25: qty 42.5

## 2014-02-25 MED ORDER — SODIUM CHLORIDE 0.9 % IV SOLN
2400.0000 mg/m2 | INTRAVENOUS | Status: DC
  Administered 2014-02-25: 5100 mg via INTRAVENOUS
  Filled 2014-02-25: qty 102

## 2014-02-25 NOTE — Progress Notes (Signed)
OFFICE PROGRESS NOTE  Interval history:  Ms. Dreier returns for scheduled followup of pancreas cancer. She completed cycle 8 FOLFIRINOX on 02/11/2014. She developed nausea the evening of treatment. The nausea lasted for 3 days. No vomiting. No change in baseline loose stools. She takes Imodium as needed. Cold sensitivity began to improve after 5 days. She continues to note cold sensitivity with extreme cold exposure. No neuropathy symptoms in the absence of cold exposure. She has a good appetite. No pain.   Objective: Filed Vitals:   02/25/14 0838  BP: 141/94  Pulse: 89  Temp: 98 F (36.7 C)  Resp: 18   Oropharynx is without thrush or ulceration. Lungs are clear. Regular cardiac rhythm. Port-A-Cath site without erythema. Abdomen soft and nontender. No hepatomegaly. No mass. No leg edema. Vibratory sense intact over the fingertips bilaterally. Hands with hyperpigmentation.   Lab Results: Lab Results  Component Value Date   WBC 6.9 02/25/2014   HGB 10.7* 02/25/2014   HCT 33.5* 02/25/2014   MCV 84.1 02/25/2014   PLT 171 02/25/2014   NEUTROABS 4.3 02/25/2014    Chemistry:    Chemistry      Component Value Date/Time   NA 139 02/25/2014 0825   NA 137 10/03/2013 0410   K 4.0 02/25/2014 0825   K 4.0 10/03/2013 0410   CL 102 10/03/2013 0410   CO2 26 02/25/2014 0825   CO2 26 10/03/2013 0410   BUN 9.8 02/25/2014 0825   BUN 9 10/03/2013 0410   CREATININE 0.9 02/25/2014 0825   CREATININE 0.78 10/03/2013 0410      Component Value Date/Time   CALCIUM 9.4 02/25/2014 0825   CALCIUM 9.7 10/03/2013 0410   ALKPHOS 184* 02/25/2014 0825   ALKPHOS 101 10/03/2013 0410   AST 29 02/25/2014 0825   AST 87* 10/03/2013 0410   ALT 23 02/25/2014 0825   ALT 76* 10/03/2013 0410   BILITOT 0.26 02/25/2014 0825   BILITOT 0.3 10/03/2013 0410       Studies/Results: No results found.  Medications: I have reviewed the patient's current medications.  Assessment/Plan: 1. Clinical stage III (T4 N1)  adenocarcinoma of the pancreas. Initiation of FOLFIRINOX 10/29/2013. She did not complete the 5-FU pump due to developing altered speech. Cycle 2 completed 11/12/2013. Cycle 3 completed 11/26/2013. Cycle 4 completed 12/09/2013, cycle 5 12/24/2013, cycle 6 01/14/2014, cycle 7 01/28/2014, cycle 8 02/11/2014. Restaging CT 01/06/2012 with a slight increase in the size of the pancreas mass and no clear evidence of distant metastatic disease. CEA improved 01/28/2014. 2. Altered speech prior to completion of cycle 1 of FOLFIRINOX, similar symptoms and tongue/lip twitching following the oxaliplatin with cycle 2 FOLFIRINOX. Oxaliplatin was dose reduced and given over 4 hours beginning with cycle 3. She had similar neurologic symptoms after subsequent cycles of FOLFIRINOX. 3. Status post cholecystectomy 10/02/2013 with pathology revealing chronic cholecystitis. 4. Abdomen/back pain secondary to #1. Improved 5. Hypertension. 6. Status post Port-A-Cath placement 10/28/2013. 7. History of elevated liver enzyme-improved. 8. History of Hypokalemia. Taking a potassium supplement. 9. History of neutropenia secondary to chemotherapy. Neulasta was added beginning with cycle 6 on 01/14/2014.    Dispositon-she appears stable. She has completed 8 cycles of FOLFIRINOX. Plan to proceed with cycle 9 today as scheduled. She is experiencing nausea for about 3 days after treatment. We will add Aloxi to the premedication regimen.  She will return for a followup visit and cycle 10 in 2 weeks.   Ned Card ANP/GNP-BC

## 2014-02-25 NOTE — Patient Instructions (Signed)
[facility] Discharge Instructions for Patients Receiving Chemotherapy  Today you received the following chemotherapy agents Oxaliplatin, leucovorin, 5FU and Camptosar.  To help prevent nausea and vomiting after your treatment, we encourage you to take your nausea medication Phenergan 25 mg as ordered by Dr. Benay Spice.   If you develop nausea and vomiting that is not controlled by your nausea medication, call the clinic.   BELOW ARE SYMPTOMS THAT SHOULD BE REPORTED IMMEDIATELY:  *FEVER GREATER THAN 100.5 F  *CHILLS WITH OR WITHOUT FEVER  NAUSEA AND VOMITING THAT IS NOT CONTROLLED WITH YOUR NAUSEA MEDICATION  *UNUSUAL SHORTNESS OF BREATH  *UNUSUAL BRUISING OR BLEEDING  TENDERNESS IN MOUTH AND THROAT WITH OR WITHOUT PRESENCE OF ULCERS  *URINARY PROBLEMS  *BOWEL PROBLEMS  UNUSUAL RASH Items with * indicate a potential emergency and should be followed up as soon as possible.  Feel free to call the clinic you have any questions or concerns. The clinic phone number is [phone].

## 2014-02-25 NOTE — Telephone Encounter (Signed)
gv adn printed appt sched and avs for pt for March and Shalini....sed added tx.    °

## 2014-02-25 NOTE — Progress Notes (Signed)
Discharged at 1730 with spouse to home ambulatory in no distress.

## 2014-02-27 ENCOUNTER — Ambulatory Visit: Payer: 59

## 2014-02-27 ENCOUNTER — Other Ambulatory Visit: Payer: Self-pay | Admitting: Emergency Medicine

## 2014-02-27 ENCOUNTER — Ambulatory Visit (HOSPITAL_BASED_OUTPATIENT_CLINIC_OR_DEPARTMENT_OTHER): Payer: 59

## 2014-02-27 VITALS — BP 142/88 | HR 78 | Temp 98.7°F | Resp 18

## 2014-02-27 DIAGNOSIS — Z5189 Encounter for other specified aftercare: Secondary | ICD-10-CM

## 2014-02-27 DIAGNOSIS — C250 Malignant neoplasm of head of pancreas: Secondary | ICD-10-CM

## 2014-02-27 MED ORDER — HEPARIN SOD (PORK) LOCK FLUSH 100 UNIT/ML IV SOLN
500.0000 [IU] | Freq: Once | INTRAVENOUS | Status: AC | PRN
  Administered 2014-02-27: 500 [IU]
  Filled 2014-02-27: qty 5

## 2014-02-27 MED ORDER — PEGFILGRASTIM INJECTION 6 MG/0.6ML
6.0000 mg | Freq: Once | SUBCUTANEOUS | Status: AC
  Administered 2014-02-27: 6 mg via SUBCUTANEOUS

## 2014-02-27 MED ORDER — SODIUM CHLORIDE 0.9 % IJ SOLN
10.0000 mL | INTRAMUSCULAR | Status: DC | PRN
  Administered 2014-02-27: 10 mL
  Filled 2014-02-27: qty 10

## 2014-02-27 NOTE — Progress Notes (Signed)
Injection given with pump dc appointment.  

## 2014-03-07 ENCOUNTER — Other Ambulatory Visit: Payer: Self-pay | Admitting: Oncology

## 2014-03-10 ENCOUNTER — Other Ambulatory Visit: Payer: Self-pay

## 2014-03-11 ENCOUNTER — Telehealth: Payer: Self-pay | Admitting: Oncology

## 2014-03-11 ENCOUNTER — Other Ambulatory Visit (HOSPITAL_BASED_OUTPATIENT_CLINIC_OR_DEPARTMENT_OTHER): Payer: 59

## 2014-03-11 ENCOUNTER — Ambulatory Visit (HOSPITAL_BASED_OUTPATIENT_CLINIC_OR_DEPARTMENT_OTHER): Payer: 59 | Admitting: Nurse Practitioner

## 2014-03-11 ENCOUNTER — Other Ambulatory Visit: Payer: Self-pay | Admitting: *Deleted

## 2014-03-11 ENCOUNTER — Encounter: Payer: Self-pay | Admitting: Oncology

## 2014-03-11 ENCOUNTER — Ambulatory Visit (HOSPITAL_BASED_OUTPATIENT_CLINIC_OR_DEPARTMENT_OTHER): Payer: 59

## 2014-03-11 VITALS — BP 135/94 | HR 83 | Temp 98.1°F | Resp 20 | Ht 65.0 in | Wt 210.8 lb

## 2014-03-11 DIAGNOSIS — C259 Malignant neoplasm of pancreas, unspecified: Secondary | ICD-10-CM

## 2014-03-11 DIAGNOSIS — C250 Malignant neoplasm of head of pancreas: Secondary | ICD-10-CM

## 2014-03-11 DIAGNOSIS — Z5111 Encounter for antineoplastic chemotherapy: Secondary | ICD-10-CM

## 2014-03-11 DIAGNOSIS — E876 Hypokalemia: Secondary | ICD-10-CM

## 2014-03-11 LAB — CEA: CEA: 6 ng/mL — AB (ref 0.0–5.0)

## 2014-03-11 LAB — CBC WITH DIFFERENTIAL/PLATELET
BASO%: 0.4 % (ref 0.0–2.0)
BASOS ABS: 0 10*3/uL (ref 0.0–0.1)
EOS%: 1.3 % (ref 0.0–7.0)
Eosinophils Absolute: 0.1 10*3/uL (ref 0.0–0.5)
HCT: 35.2 % (ref 34.8–46.6)
HEMOGLOBIN: 11 g/dL — AB (ref 11.6–15.9)
LYMPH%: 30.4 % (ref 14.0–49.7)
MCH: 26.5 pg (ref 25.1–34.0)
MCHC: 31.3 g/dL — ABNORMAL LOW (ref 31.5–36.0)
MCV: 84.8 fL (ref 79.5–101.0)
MONO#: 0.6 10*3/uL (ref 0.1–0.9)
MONO%: 10.1 % (ref 0.0–14.0)
NEUT%: 57.8 % (ref 38.4–76.8)
NEUTROS ABS: 3.4 10*3/uL (ref 1.5–6.5)
PLATELETS: 187 10*3/uL (ref 145–400)
RBC: 4.14 10*6/uL (ref 3.70–5.45)
RDW: 17.6 % — ABNORMAL HIGH (ref 11.2–14.5)
WBC: 5.9 10*3/uL (ref 3.9–10.3)
lymph#: 1.8 10*3/uL (ref 0.9–3.3)

## 2014-03-11 LAB — COMPREHENSIVE METABOLIC PANEL (CC13)
ALK PHOS: 231 U/L — AB (ref 40–150)
ALT: 22 U/L (ref 0–55)
AST: 26 U/L (ref 5–34)
Albumin: 3.8 g/dL (ref 3.5–5.0)
Anion Gap: 12 mEq/L — ABNORMAL HIGH (ref 3–11)
BILIRUBIN TOTAL: 0.27 mg/dL (ref 0.20–1.20)
BUN: 8.6 mg/dL (ref 7.0–26.0)
CO2: 25 mEq/L (ref 22–29)
CREATININE: 0.9 mg/dL (ref 0.6–1.1)
Calcium: 9.3 mg/dL (ref 8.4–10.4)
Chloride: 105 mEq/L (ref 98–109)
Glucose: 95 mg/dl (ref 70–140)
Potassium: 4 mEq/L (ref 3.5–5.1)
Sodium: 142 mEq/L (ref 136–145)
Total Protein: 7 g/dL (ref 6.4–8.3)

## 2014-03-11 MED ORDER — PALONOSETRON HCL INJECTION 0.25 MG/5ML
0.2500 mg | Freq: Once | INTRAVENOUS | Status: AC
  Administered 2014-03-11: 0.25 mg via INTRAVENOUS

## 2014-03-11 MED ORDER — IRINOTECAN HCL CHEMO INJECTION 100 MG/5ML
180.0000 mg/m2 | Freq: Once | INTRAVENOUS | Status: AC
  Administered 2014-03-11: 380 mg via INTRAVENOUS
  Filled 2014-03-11: qty 19

## 2014-03-11 MED ORDER — DEXAMETHASONE SODIUM PHOSPHATE 20 MG/5ML IJ SOLN
INTRAMUSCULAR | Status: AC
  Filled 2014-03-11: qty 5

## 2014-03-11 MED ORDER — ATROPINE SULFATE 1 MG/ML IJ SOLN
0.5000 mg | Freq: Once | INTRAMUSCULAR | Status: AC | PRN
  Administered 2014-03-11: 0.5 mg via INTRAVENOUS

## 2014-03-11 MED ORDER — PROMETHAZINE HCL 25 MG PO TABS: 25.0000 mg | ORAL_TABLET | Freq: Four times a day (QID) | ORAL | Status: DC | PRN

## 2014-03-11 MED ORDER — ATROPINE SULFATE 1 MG/ML IJ SOLN
INTRAMUSCULAR | Status: AC
  Filled 2014-03-11: qty 1

## 2014-03-11 MED ORDER — HEPARIN SOD (PORK) LOCK FLUSH 100 UNIT/ML IV SOLN
500.0000 [IU] | Freq: Once | INTRAVENOUS | Status: DC | PRN
  Filled 2014-03-11: qty 5

## 2014-03-11 MED ORDER — OXALIPLATIN CHEMO INJECTION 100 MG/20ML
65.0000 mg/m2 | Freq: Once | INTRAVENOUS | Status: AC
  Administered 2014-03-11: 140 mg via INTRAVENOUS
  Filled 2014-03-11: qty 28

## 2014-03-11 MED ORDER — DEXAMETHASONE SODIUM PHOSPHATE 20 MG/5ML IJ SOLN
20.0000 mg | Freq: Once | INTRAMUSCULAR | Status: AC
  Administered 2014-03-11: 20 mg via INTRAVENOUS

## 2014-03-11 MED ORDER — PALONOSETRON HCL INJECTION 0.25 MG/5ML
INTRAVENOUS | Status: AC
  Filled 2014-03-11: qty 5

## 2014-03-11 MED ORDER — DEXTROSE 5 % IV SOLN
Freq: Once | INTRAVENOUS | Status: AC
  Administered 2014-03-11: 10:00:00 via INTRAVENOUS

## 2014-03-11 MED ORDER — LEUCOVORIN CALCIUM INJECTION 350 MG
850.0000 mg | Freq: Once | INTRAVENOUS | Status: AC
  Administered 2014-03-11: 850 mg via INTRAVENOUS
  Filled 2014-03-11: qty 42.5

## 2014-03-11 MED ORDER — DEXTROSE 5 % IV SOLN: 65.0000 mg/m2 | Freq: Once | INTRAVENOUS | Status: DC

## 2014-03-11 MED ORDER — FLUOROURACIL CHEMO INJECTION 5 GM/100ML
2400.0000 mg/m2 | INTRAVENOUS | Status: DC
  Administered 2014-03-11: 5100 mg via INTRAVENOUS
  Filled 2014-03-11: qty 102

## 2014-03-11 MED ORDER — SODIUM CHLORIDE 0.9 % IJ SOLN
10.0000 mL | INTRAMUSCULAR | Status: DC | PRN
  Filled 2014-03-11: qty 10

## 2014-03-11 NOTE — Progress Notes (Signed)
  [facility] OFFICE PROGRESS NOTE   Diagnosis:  Pancreas cancer.  INTERVAL HISTORY:   [name] returns as scheduled. She completed cycle 9 FOLFIRINOX on 02/25/2014. She had significantly less nausea with the addition of Aloxi. She also noted less diarrhea. She denies mouth sores. She has mild persistent cold sensitivity. No neuropathy symptoms in the absence of cold exposure. She denies pain.  Objective:  Vital signs in last 24 hours:  Blood pressure 135/94, pulse 83, temperature 98.1 F (36.7 C), temperature source Oral, resp. rate 20, height 5\' 5"  (1.651 m), weight 210 lb 12.8 oz (95.618 kg), SpO2 98.00%.    HEENT: No thrush or ulceration. Resp: Lungs clear. Cardio: Regular cardiac rhythm. GI: Abdomen soft and nontender. No hepatomegaly. No mass. Vascular: No leg edema. Calves soft and nontender. Neuro: Vibratory sense mildly decreased over the fingertips per tuning fork exam.  Skin: No skin rash.   Portacath/PICC-without erythema.  Lab Results:  Lab Results  Component Value Date   WBC 5.9 03/11/2014   HGB 11.0* 03/11/2014   HCT 35.2 03/11/2014   MCV 84.8 03/11/2014   PLT 187 03/11/2014   NEUTROABS 3.4 03/11/2014      Lab Results  Component Value Date   CEA 9.8* 01/28/2014    Imaging:  No results found.  Medications: I have reviewed the patient's current medications.  Assessment/Plan: 1. 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 Initiation of FOLFIRINOX 10/29/2013. She did not complete the 5-FU pump due to developing altered speech. Cycle 2 completed 11/12/2013. Cycle 3 completed 11/26/2013. Cycle 4 completed 12/09/2013, cycle 5 12/24/2013, cycle 6 01/14/2014, cycle 7 01/28/2014, cycle 8 02/11/2014, cycle 9 02/25/2014. Restaging CT 01/06/2012 with a slight increase in the size of the pancreas mass and no clear evidence of distant metastatic disease.  CEA improved 01/28/2014. 2. Altered speech prior to completion of cycle 1 of  FOLFIRINOX, similar symptoms and tongue/lip twitching following the oxaliplatin with cycle 2 FOLFIRINOX. Oxaliplatin was dose reduced and given over 4 hours beginning with cycle 3. She had similar neurologic symptoms after subsequent cycles of FOLFIRINOX. 3. Status post cholecystectomy 10/02/2013 with pathology revealing chronic cholecystitis. 4. Abdomen/back pain secondary to #1. Improved 5. Hypertension. 6. Status post Port-A-Cath placement 10/28/2013. 7. History of elevated liver enzyme-improved. 8. History of Hypokalemia. Taking a potassium supplement. 9. History of neutropenia secondary to chemotherapy. Neulasta was added beginning with cycle 6 on 01/14/2014. 10. Delayed nausea. Improved with the addition of Aloxi.   Disposition: She appears stable. She has completed 9 cycles of FOLFIRINOX. Plan to proceed with cycle 10 today as scheduled. She will return for a followup visit and cycle 11 in 2 weeks. The oxaliplatin will likely be eliminated from the regimen with cycle 11.  She will contact the office prior to her next visit with any problems.  Plan reviewed with Dr. Benay Spice.    Ned Card ANP/GNP-BC   03/11/2014  9:55 AM

## 2014-03-11 NOTE — Progress Notes (Signed)
Put husband's fmla form on nurse's desk. °

## 2014-03-11 NOTE — Telephone Encounter (Signed)
gv pt appt schedule for Frances [name].  °

## 2014-03-12 ENCOUNTER — Encounter: Payer: Self-pay | Admitting: Oncology

## 2014-03-12 NOTE — Progress Notes (Signed)
Faxed husband's fmla form to [employer] @ [phone] °

## 2014-03-13 ENCOUNTER — Ambulatory Visit (HOSPITAL_BASED_OUTPATIENT_CLINIC_OR_DEPARTMENT_OTHER): Payer: 59

## 2014-03-13 VITALS — BP 133/83 | HR 81 | Temp 98.8°F

## 2014-03-13 DIAGNOSIS — Z5189 Encounter for other specified aftercare: Secondary | ICD-10-CM

## 2014-03-13 DIAGNOSIS — C259 Malignant neoplasm of pancreas, unspecified: Secondary | ICD-10-CM

## 2014-03-13 DIAGNOSIS — C250 Malignant neoplasm of head of pancreas: Secondary | ICD-10-CM

## 2014-03-13 MED ORDER — PEGFILGRASTIM INJECTION 6 MG/0.6ML
6.0000 mg | Freq: Once | SUBCUTANEOUS | Status: AC
  Administered 2014-03-13: 6 mg via SUBCUTANEOUS

## 2014-03-13 MED ORDER — HEPARIN SOD (PORK) LOCK FLUSH 100 UNIT/ML IV SOLN
500.0000 [IU] | Freq: Once | INTRAVENOUS | Status: AC | PRN
  Administered 2014-03-13: 500 [IU]
  Filled 2014-03-13: qty 5

## 2014-03-13 MED ORDER — SODIUM CHLORIDE 0.9 % IJ SOLN
10.0000 mL | INTRAMUSCULAR | Status: DC | PRN
  Administered 2014-03-13: 10 mL
  Filled 2014-03-13: qty 10

## 2014-03-13 NOTE — Patient Instructions (Signed)

## 2014-03-21 ENCOUNTER — Other Ambulatory Visit: Payer: Self-pay | Admitting: Oncology

## 2014-03-24 ENCOUNTER — Other Ambulatory Visit: Payer: Self-pay | Admitting: Oncology

## 2014-03-25 ENCOUNTER — Other Ambulatory Visit (HOSPITAL_BASED_OUTPATIENT_CLINIC_OR_DEPARTMENT_OTHER): Payer: 59

## 2014-03-25 ENCOUNTER — Ambulatory Visit (HOSPITAL_BASED_OUTPATIENT_CLINIC_OR_DEPARTMENT_OTHER): Payer: 59

## 2014-03-25 ENCOUNTER — Telehealth: Payer: Self-pay | Admitting: Oncology

## 2014-03-25 ENCOUNTER — Telehealth: Payer: Self-pay | Admitting: *Deleted

## 2014-03-25 ENCOUNTER — Ambulatory Visit (HOSPITAL_BASED_OUTPATIENT_CLINIC_OR_DEPARTMENT_OTHER): Payer: 59 | Admitting: Oncology

## 2014-03-25 VITALS — BP 119/75 | HR 87 | Temp 99.0°F | Resp 18 | Ht 65.0 in | Wt 211.0 lb

## 2014-03-25 DIAGNOSIS — Z5111 Encounter for antineoplastic chemotherapy: Secondary | ICD-10-CM

## 2014-03-25 DIAGNOSIS — C259 Malignant neoplasm of pancreas, unspecified: Secondary | ICD-10-CM

## 2014-03-25 DIAGNOSIS — C250 Malignant neoplasm of head of pancreas: Secondary | ICD-10-CM

## 2014-03-25 DIAGNOSIS — G620 Drug-induced polyneuropathy: Secondary | ICD-10-CM

## 2014-03-25 LAB — COMPREHENSIVE METABOLIC PANEL (CC13)
ALK PHOS: 212 U/L — AB (ref 40–150)
ALT: 16 U/L (ref 0–55)
AST: 24 U/L (ref 5–34)
Albumin: 3.5 g/dL (ref 3.5–5.0)
Anion Gap: 10 mEq/L (ref 3–11)
BUN: 7.8 mg/dL (ref 7.0–26.0)
CALCIUM: 9.2 mg/dL (ref 8.4–10.4)
CHLORIDE: 106 meq/L (ref 98–109)
CO2: 27 mEq/L (ref 22–29)
CREATININE: 0.8 mg/dL (ref 0.6–1.1)
Glucose: 83 mg/dl (ref 70–140)
POTASSIUM: 4.2 meq/L (ref 3.5–5.1)
Sodium: 143 mEq/L (ref 136–145)
Total Bilirubin: 0.24 mg/dL (ref 0.20–1.20)
Total Protein: 6.5 g/dL (ref 6.4–8.3)

## 2014-03-25 LAB — CBC WITH DIFFERENTIAL/PLATELET
BASO%: 0.5 % (ref 0.0–2.0)
Basophils Absolute: 0 10*3/uL (ref 0.0–0.1)
EOS%: 1 % (ref 0.0–7.0)
Eosinophils Absolute: 0 10*3/uL (ref 0.0–0.5)
HCT: 34.7 % — ABNORMAL LOW (ref 34.8–46.6)
HGB: 10.5 g/dL — ABNORMAL LOW (ref 11.6–15.9)
LYMPH%: 36.5 % (ref 14.0–49.7)
MCH: 25.9 pg (ref 25.1–34.0)
MCHC: 30.3 g/dL — AB (ref 31.5–36.0)
MCV: 85.5 fL (ref 79.5–101.0)
MONO#: 0.5 10*3/uL (ref 0.1–0.9)
MONO%: 13.5 % (ref 0.0–14.0)
NEUT#: 1.9 10*3/uL (ref 1.5–6.5)
NEUT%: 48.5 % (ref 38.4–76.8)
Platelets: 166 10*3/uL (ref 145–400)
RBC: 4.06 10*6/uL (ref 3.70–5.45)
RDW: 16 % — AB (ref 11.2–14.5)
WBC: 3.9 10*3/uL (ref 3.9–10.3)
lymph#: 1.4 10*3/uL (ref 0.9–3.3)

## 2014-03-25 MED ORDER — IRINOTECAN HCL CHEMO INJECTION 100 MG/5ML
180.0000 mg/m2 | Freq: Once | INTRAVENOUS | Status: AC
  Administered 2014-03-25: 380 mg via INTRAVENOUS
  Filled 2014-03-25: qty 19

## 2014-03-25 MED ORDER — LEUCOVORIN CALCIUM INJECTION 350 MG
850.0000 mg | Freq: Once | INTRAVENOUS | Status: AC
  Administered 2014-03-25: 850 mg via INTRAVENOUS
  Filled 2014-03-25: qty 42.5

## 2014-03-25 MED ORDER — PALONOSETRON HCL INJECTION 0.25 MG/5ML
0.2500 mg | Freq: Once | INTRAVENOUS | Status: AC
  Administered 2014-03-25: 0.25 mg via INTRAVENOUS

## 2014-03-25 MED ORDER — DEXAMETHASONE SODIUM PHOSPHATE 20 MG/5ML IJ SOLN
INTRAMUSCULAR | Status: AC
  Filled 2014-03-25: qty 5

## 2014-03-25 MED ORDER — ATROPINE SULFATE 1 MG/ML IJ SOLN
0.5000 mg | Freq: Once | INTRAMUSCULAR | Status: AC | PRN
  Administered 2014-03-25: 0.5 mg via INTRAVENOUS

## 2014-03-25 MED ORDER — SODIUM CHLORIDE 0.9 % IV SOLN
2400.0000 mg/m2 | INTRAVENOUS | Status: DC
  Administered 2014-03-25: 5100 mg via INTRAVENOUS
  Filled 2014-03-25: qty 102

## 2014-03-25 MED ORDER — SODIUM CHLORIDE 0.9 % IV SOLN
INTRAVENOUS | Status: DC
  Administered 2014-03-25: 11:00:00 via INTRAVENOUS

## 2014-03-25 MED ORDER — PALONOSETRON HCL INJECTION 0.25 MG/5ML
INTRAVENOUS | Status: AC
  Filled 2014-03-25: qty 5

## 2014-03-25 MED ORDER — DEXAMETHASONE SODIUM PHOSPHATE 20 MG/5ML IJ SOLN
20.0000 mg | Freq: Once | INTRAMUSCULAR | Status: AC
  Administered 2014-03-25: 20 mg via INTRAVENOUS

## 2014-03-25 MED ORDER — ATROPINE SULFATE 1 MG/ML IJ SOLN
INTRAMUSCULAR | Status: AC
  Filled 2014-03-25: qty 1

## 2014-03-25 NOTE — Telephone Encounter (Signed)
Gave pt appt for lab,md and chemo for [place], emailed [facility] regarding chemo for Yaneli 2015 gave pt oral contrast

## 2014-03-25 NOTE — [facility] Oncology Navigator Note (Signed)
Met with patient during infusion.  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  She states she feels well and has a good appetite.  She continues to work and has good support from employer.  Her husband is present today and they deny needs or barriers to care at present time.  Will continue to follow.

## 2014-03-25 NOTE — Telephone Encounter (Signed)
Per staff message and POF I have scheduled appts.  JMW  

## 2014-03-25 NOTE — Progress Notes (Signed)
  [place] OFFICE PROGRESS NOTE   Diagnosis: Pancreas cancer  INTERVAL HISTORY:   She completed another cycle FOLFIRINOX 03/11/2014. She reports persistent tingling in the fingers with cold exposure. She had less nausea after receiving Aloxi with this cycle. Good appetite. No abdominal pain.  Objective:  Vital signs in last 24 hours:  Blood pressure 119/75, pulse 87, temperature 99 F (37.2 C), temperature source Oral, resp. rate 18, height 5\' 5"  (1.651 m), weight 211 lb (95.709 kg).    HEENT: No thrush or ulcers Resp: Lungs clear bilaterally Cardio: Regular rate and rhythm GI: No hepatosplenomegaly, no mass, Vascular: No leg edema Neuro: The vibratory sense is intact at the fingertips bilaterally  Skin: Hyperpigmentation of the hands  Portacath/PICC-without erythema  Lab Results:  Lab Results  Component Value Date   WBC 3.9 03/25/2014   HGB 10.5* 03/25/2014   HCT 34.7* 03/25/2014   MCV 85.5 03/25/2014   PLT 166 03/25/2014   NEUTROABS 1.9 03/25/2014    Lab Results  Component Value Date   CEA 6.0* 03/11/2014    Imaging:  No results found.  Medications: I have reviewed the patient's current medications.  Assessment/Plan: 1. 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 Initiation of FOLFIRINOX 10/29/2013. She did not complete the 5-FU pump due to developing altered speech. Cycle 2 completed 11/12/2013. Cycle 3 completed 11/26/2013. Cycle 4 completed 12/09/2013, cycle 5 12/24/2013, cycle 6 01/14/2014, cycle 7 01/28/2014, cycle 8 02/11/2014, cycle 9 02/25/2014, cycle 10 03/11/2014 Restaging CT 01/05/2014 with a slight increase in the size of the pancreas mass and no clear evidence of distant metastatic disease.  CEA improved 03/11/2049 Chemotherapy switch to FOLFIRI 03/25/2014 2. Altered speech prior to completion of cycle 1 of FOLFIRINOX, similar symptoms and tongue/lip twitching following the oxaliplatin with cycle 2 FOLFIRINOX. Oxaliplatin  was dose reduced and given over 4 hours beginning with cycle 3. She had similar neurologic symptoms after subsequent cycles of FOLFIRINOX. 3. Status post cholecystectomy 10/02/2013 with pathology revealing chronic cholecystitis. 4. Abdomen/back pain secondary to #1. Resolved 5. Hypertension. 6. Status post Port-A-Cath placement 10/28/2013. 7. History of elevated liver enzyme-improved. 8. History of Hypokalemia. Taking a potassium supplement. 9. History of neutropenia secondary to chemotherapy. Neulasta was added beginning with cycle 6 on 01/14/2014. 10. Delayed nausea. Improved with the addition of Aloxi.   Disposition:  She has completed 10 cycles of FOLFIRINOX. 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 We decided to discontinue oxaliplatin from the chemotherapy regimen beginning today. She will continue chemotherapy with FOLFIRI. Ms. Harwood will return for an office visit and chemotherapy in 2 weeks. The plan is to obtain a restaging CT after 2 more cycles of chemotherapy.  Ladell Pier, MD  03/25/2014  5:51 PM

## 2014-03-25 NOTE — Patient Instructions (Signed)
[facility] Discharge Instructions for Patients Receiving Chemotherapy  Today you received the following chemotherapy agents 5 FU/Leucorvorin/Irinotecan To help prevent nausea and vomiting after your treatment, we encourage you to take your nausea medication as prescribed.If you develop nausea and vomiting that is not controlled by your nausea medication, call the clinic.   BELOW ARE SYMPTOMS THAT SHOULD BE REPORTED IMMEDIATELY:  *FEVER GREATER THAN 100.5 F  *CHILLS WITH OR WITHOUT FEVER  NAUSEA AND VOMITING THAT IS NOT CONTROLLED WITH YOUR NAUSEA MEDICATION  *UNUSUAL SHORTNESS OF BREATH  *UNUSUAL BRUISING OR BLEEDING  TENDERNESS IN MOUTH AND THROAT WITH OR WITHOUT PRESENCE OF ULCERS  *URINARY PROBLEMS  *BOWEL PROBLEMS  UNUSUAL RASH Items with * indicate a potential emergency and should be followed up as soon as possible.  Feel free to call the clinic you have any questions or concerns. The clinic phone number is [phone].

## 2014-03-26 ENCOUNTER — Telehealth: Payer: Self-pay | Admitting: Oncology

## 2014-03-26 NOTE — Telephone Encounter (Signed)
Talked to pt and gave her appt for Ct @ [facility] imaging , she is aware of all chemo appts for MAy 2015

## 2014-03-27 ENCOUNTER — Ambulatory Visit (HOSPITAL_BASED_OUTPATIENT_CLINIC_OR_DEPARTMENT_OTHER): Payer: 59

## 2014-03-27 DIAGNOSIS — Z5189 Encounter for other specified aftercare: Secondary | ICD-10-CM

## 2014-03-27 DIAGNOSIS — C250 Malignant neoplasm of head of pancreas: Secondary | ICD-10-CM

## 2014-03-27 MED ORDER — SODIUM CHLORIDE 0.9 % IJ SOLN
10.0000 mL | INTRAMUSCULAR | Status: DC | PRN
  Administered 2014-03-27: 10 mL
  Filled 2014-03-27: qty 10

## 2014-03-27 MED ORDER — HEPARIN SOD (PORK) LOCK FLUSH 100 UNIT/ML IV SOLN
500.0000 [IU] | Freq: Once | INTRAVENOUS | Status: AC | PRN
  Administered 2014-03-27: 500 [IU]
  Filled 2014-03-27: qty 5

## 2014-03-27 MED ORDER — PEGFILGRASTIM INJECTION 6 MG/0.6ML
6.0000 mg | Freq: Once | SUBCUTANEOUS | Status: AC
  Administered 2014-03-27: 6 mg via SUBCUTANEOUS

## 2014-04-01 ENCOUNTER — Encounter: Payer: Self-pay | Admitting: Oncology

## 2014-04-01 NOTE — Progress Notes (Signed)
Enrolled pt in the Neulasta First Step program.  Faxed signed form and activated card today.  °

## 2014-04-04 ENCOUNTER — Other Ambulatory Visit: Payer: Self-pay | Admitting: Oncology

## 2014-04-08 ENCOUNTER — Other Ambulatory Visit (HOSPITAL_BASED_OUTPATIENT_CLINIC_OR_DEPARTMENT_OTHER): Payer: 59

## 2014-04-08 ENCOUNTER — Ambulatory Visit (HOSPITAL_BASED_OUTPATIENT_CLINIC_OR_DEPARTMENT_OTHER): Payer: 59 | Admitting: Oncology

## 2014-04-08 ENCOUNTER — Ambulatory Visit (HOSPITAL_BASED_OUTPATIENT_CLINIC_OR_DEPARTMENT_OTHER): Payer: 59

## 2014-04-08 ENCOUNTER — Other Ambulatory Visit: Payer: Self-pay | Admitting: *Deleted

## 2014-04-08 VITALS — BP 131/88 | HR 80 | Temp 97.9°F | Resp 18 | Ht 65.0 in | Wt 213.9 lb

## 2014-04-08 DIAGNOSIS — G622 Polyneuropathy due to other toxic agents: Secondary | ICD-10-CM

## 2014-04-08 DIAGNOSIS — C259 Malignant neoplasm of pancreas, unspecified: Secondary | ICD-10-CM

## 2014-04-08 DIAGNOSIS — I10 Essential (primary) hypertension: Secondary | ICD-10-CM

## 2014-04-08 DIAGNOSIS — C250 Malignant neoplasm of head of pancreas: Secondary | ICD-10-CM

## 2014-04-08 DIAGNOSIS — B370 Candidal stomatitis: Secondary | ICD-10-CM

## 2014-04-08 DIAGNOSIS — Z5111 Encounter for antineoplastic chemotherapy: Secondary | ICD-10-CM

## 2014-04-08 DIAGNOSIS — G893 Neoplasm related pain (acute) (chronic): Secondary | ICD-10-CM

## 2014-04-08 DIAGNOSIS — T451X5A Adverse effect of antineoplastic and immunosuppressive drugs, initial encounter: Secondary | ICD-10-CM

## 2014-04-08 LAB — COMPREHENSIVE METABOLIC PANEL (CC13)
ALBUMIN: 3.5 g/dL (ref 3.5–5.0)
ALT: 15 U/L (ref 0–55)
AST: 20 U/L (ref 5–34)
Alkaline Phosphatase: 202 U/L — ABNORMAL HIGH (ref 40–150)
Anion Gap: 9 mEq/L (ref 3–11)
BUN: 10.3 mg/dL (ref 7.0–26.0)
CHLORIDE: 106 meq/L (ref 98–109)
CO2: 26 mEq/L (ref 22–29)
Calcium: 9.2 mg/dL (ref 8.4–10.4)
Creatinine: 1 mg/dL (ref 0.6–1.1)
GLUCOSE: 90 mg/dL (ref 70–140)
POTASSIUM: 3.9 meq/L (ref 3.5–5.1)
Sodium: 141 mEq/L (ref 136–145)
Total Bilirubin: 0.25 mg/dL (ref 0.20–1.20)
Total Protein: 6.4 g/dL (ref 6.4–8.3)

## 2014-04-08 LAB — CBC WITH DIFFERENTIAL/PLATELET
BASO%: 0.6 % (ref 0.0–2.0)
Basophils Absolute: 0 10*3/uL (ref 0.0–0.1)
EOS ABS: 0.1 10*3/uL (ref 0.0–0.5)
EOS%: 2 % (ref 0.0–7.0)
HEMATOCRIT: 33.6 % — AB (ref 34.8–46.6)
HEMOGLOBIN: 10.6 g/dL — AB (ref 11.6–15.9)
LYMPH#: 1.5 10*3/uL (ref 0.9–3.3)
LYMPH%: 34.8 % (ref 14.0–49.7)
MCH: 26.5 pg (ref 25.1–34.0)
MCHC: 31.5 g/dL (ref 31.5–36.0)
MCV: 84.2 fL (ref 79.5–101.0)
MONO#: 0.4 10*3/uL (ref 0.1–0.9)
MONO%: 10.1 % (ref 0.0–14.0)
NEUT#: 2.3 10*3/uL (ref 1.5–6.5)
NEUT%: 52.5 % (ref 38.4–76.8)
Platelets: 189 10*3/uL (ref 145–400)
RBC: 3.99 10*6/uL (ref 3.70–5.45)
RDW: 17 % — AB (ref 11.2–14.5)
WBC: 4.3 10*3/uL (ref 3.9–10.3)

## 2014-04-08 MED ORDER — LEUCOVORIN CALCIUM INJECTION 350 MG
850.0000 mg | Freq: Once | INTRAMUSCULAR | Status: AC
  Administered 2014-04-08: 850 mg via INTRAVENOUS
  Filled 2014-04-08: qty 42.5

## 2014-04-08 MED ORDER — SODIUM CHLORIDE 0.9 % IV SOLN
2400.0000 mg/m2 | INTRAVENOUS | Status: DC
  Administered 2014-04-08: 5100 mg via INTRAVENOUS
  Filled 2014-04-08: qty 102

## 2014-04-08 MED ORDER — ATROPINE SULFATE 1 MG/ML IJ SOLN
INTRAMUSCULAR | Status: AC
  Filled 2014-04-08: qty 1

## 2014-04-08 MED ORDER — DEXAMETHASONE SODIUM PHOSPHATE 20 MG/5ML IJ SOLN
20.0000 mg | Freq: Once | INTRAMUSCULAR | Status: AC
  Administered 2014-04-08: 20 mg via INTRAVENOUS

## 2014-04-08 MED ORDER — FLUCONAZOLE 100 MG PO TABS: 100.0000 mg | ORAL_TABLET | Freq: Every day | ORAL | Status: DC

## 2014-04-08 MED ORDER — PALONOSETRON HCL INJECTION 0.25 MG/5ML
INTRAVENOUS | Status: AC
  Filled 2014-04-08: qty 5

## 2014-04-08 MED ORDER — PALONOSETRON HCL INJECTION 0.25 MG/5ML
0.2500 mg | Freq: Once | INTRAVENOUS | Status: AC
  Administered 2014-04-08: 0.25 mg via INTRAVENOUS

## 2014-04-08 MED ORDER — ATROPINE SULFATE 1 MG/ML IJ SOLN
0.5000 mg | Freq: Once | INTRAMUSCULAR | Status: AC | PRN
  Administered 2014-04-08: 0.5 mg via INTRAVENOUS

## 2014-04-08 MED ORDER — DEXAMETHASONE SODIUM PHOSPHATE 20 MG/5ML IJ SOLN
INTRAMUSCULAR | Status: AC
  Filled 2014-04-08: qty 5

## 2014-04-08 MED ORDER — CLOTRIMAZOLE 10 MG MT LOZG: 10.0000 mg | LOZENGE | Freq: Three times a day (TID) | OROMUCOSAL | Status: DC

## 2014-04-08 MED ORDER — IRINOTECAN HCL CHEMO INJECTION 100 MG/5ML
180.0000 mg/m2 | Freq: Once | INTRAVENOUS | Status: AC
  Administered 2014-04-08: 380 mg via INTRAVENOUS
  Filled 2014-04-08: qty 19

## 2014-04-08 NOTE — Patient Instructions (Signed)
[facility] Discharge Instructions for Patients Receiving Chemotherapy  Today you received the following chemotherapy agents Camptosar, Leucovorin and 5FU.  To help prevent nausea and vomiting after your treatment, we encourage you to take your nausea medication.   If you develop nausea and vomiting that is not controlled by your nausea medication, call the clinic.   BELOW ARE SYMPTOMS THAT SHOULD BE REPORTED IMMEDIATELY:  *FEVER GREATER THAN 100.5 F  *CHILLS WITH OR WITHOUT FEVER  NAUSEA AND VOMITING THAT IS NOT CONTROLLED WITH YOUR NAUSEA MEDICATION  *UNUSUAL SHORTNESS OF BREATH  *UNUSUAL BRUISING OR BLEEDING  TENDERNESS IN MOUTH AND THROAT WITH OR WITHOUT PRESENCE OF ULCERS  *URINARY PROBLEMS  *BOWEL PROBLEMS  UNUSUAL RASH Items with * indicate a potential emergency and should be followed up as soon as possible.  Feel free to call the clinic you have any questions or concerns. The clinic phone number is [phone].    

## 2014-04-08 NOTE — Progress Notes (Signed)
  [place] OFFICE PROGRESS NOTE   Diagnosis: Pancreas cancer  INTERVAL HISTORY:   She returns as scheduled. She completed a cycle of FOLFIRI 03/25/2014. She reports less nausea following this cycle of chemotherapy. The numbness in the fingers and toes has progressed. She complains of a whitecoat over the tongue. No pain. She is working. There is skin thickening over the hands. She is using a moisturizer.  Objective:  Vital signs in last 24 hours:  Blood pressure 131/88, pulse 80, temperature 97.9 F (36.6 C), temperature source Oral, resp. rate 18, height 5\' 5"  (1.651 m), weight 213 lb 14.4 oz (97.024 kg), SpO2 100.00%.    HEENT: Whitecoat over the tongue, no buccal thrush, no ulcers Resp: Lungs clear bilaterally Cardio: Regular rate and rhythm GI: No hepatomegaly, nontender, no mass Vascular: No leg edema  Skin: Hyperpigmentation of the hands   Portacath/PICC-without erythema  Lab Results:  Lab Results  Component Value Date   WBC 4.3 04/08/2014   HGB 10.6* 04/08/2014   HCT 33.6* 04/08/2014   MCV 84.2 04/08/2014   PLT 189 04/08/2014   NEUTROABS 2.3 04/08/2014     Lab Results  Component Value Date   CEA 6.0* 03/11/2014    Imaging:  No results found.  Medications: I have reviewed the patient's current medications.  Assessment/Plan: 1. 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 Initiation of FOLFIRINOX 10/29/2013. She did not complete the 5-FU pump due to developing altered speech. Cycle 2 completed 11/12/2013. Cycle 3 completed 11/26/2013. Cycle 4 completed 12/09/2013, cycle 5 12/24/2013, cycle 6 01/14/2014, cycle 7 01/28/2014, cycle 8 02/11/2014, cycle 9 02/25/2014, cycle 10 03/11/2014 Restaging CT 01/06/2012 with a slight increase in the size of the pancreas mass and no clear evidence of distant metastatic disease.  CEA improved 01/28/2014. Cycle 1 of modified FOLFIRI 03/25/2014 2. Altered speech prior to completion of cycle 1 of  FOLFIRINOX, similar symptoms and tongue/lip twitching following the oxaliplatin with cycle 2 FOLFIRINOX. Oxaliplatin was dose reduced and given over 4 hours beginning with cycle 3. She had similar neurologic symptoms after subsequent cycles of FOLFIRINOX. 3. Status post cholecystectomy 10/02/2013 with pathology revealing chronic cholecystitis. 4. Abdomen/back pain secondary to #1. Improved 5. Hypertension. 6. Status post Port-A-Cath placement 10/28/2013. 7. History of elevated liver enzyme-improved. 8. History of Hypokalemia. Taking a potassium supplement. 9. History of neutropenia secondary to chemotherapy. Neulasta was added beginning with cycle 6 on 01/14/2014. 10. Delayed nausea. Improved with the addition of Aloxi. 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 Oral candidiasis-we will prescribe Diflucan and Mycelex troches   Disposition:  She tolerated the FOLFIRI well. The plan is to proceed with another cycle of chemotherapy today. She will undergo a restaging CT evaluation after this cycle. She has developed oxaliplatin neuropathy. Hopefully the symptoms will improve over the next few months.  Ms. Duplessis will return for an office visit 04/29/2014.  Ladell Pier, MD  04/08/2014  3:58 PM

## 2014-04-10 ENCOUNTER — Ambulatory Visit (HOSPITAL_BASED_OUTPATIENT_CLINIC_OR_DEPARTMENT_OTHER): Payer: 59

## 2014-04-10 VITALS — BP 134/83 | HR 73 | Temp 97.3°F | Resp 19

## 2014-04-10 DIAGNOSIS — C259 Malignant neoplasm of pancreas, unspecified: Secondary | ICD-10-CM

## 2014-04-10 DIAGNOSIS — Z5189 Encounter for other specified aftercare: Secondary | ICD-10-CM

## 2014-04-10 DIAGNOSIS — C250 Malignant neoplasm of head of pancreas: Secondary | ICD-10-CM

## 2014-04-10 MED ORDER — PEGFILGRASTIM INJECTION 6 MG/0.6ML
6.0000 mg | Freq: Once | SUBCUTANEOUS | Status: AC
  Administered 2014-04-10: 6 mg via SUBCUTANEOUS

## 2014-04-10 MED ORDER — SODIUM CHLORIDE 0.9 % IJ SOLN
10.0000 mL | INTRAMUSCULAR | Status: DC | PRN
  Administered 2014-04-10: 10 mL
  Filled 2014-04-10: qty 10

## 2014-04-10 MED ORDER — HEPARIN SOD (PORK) LOCK FLUSH 100 UNIT/ML IV SOLN
500.0000 [IU] | Freq: Once | INTRAVENOUS | Status: AC | PRN
  Administered 2014-04-10: 500 [IU]
  Filled 2014-04-10: qty 5

## 2014-04-10 NOTE — Patient Instructions (Signed)

## 2014-04-12 ENCOUNTER — Telehealth: Payer: Self-pay | Admitting: *Deleted

## 2014-04-12 ENCOUNTER — Telehealth: Payer: Self-pay | Admitting: Oncology

## 2014-04-12 NOTE — Telephone Encounter (Signed)
Per staff message and POF I have scheduled appts.  JMW  

## 2014-04-12 NOTE — Telephone Encounter (Signed)
lmonvm advising the pt of her cancelled chemo appt on 04/22/2014 that has been r/s to 04/29/2014.

## 2014-04-21 ENCOUNTER — Other Ambulatory Visit: Payer: Self-pay | Admitting: *Deleted

## 2014-04-21 DIAGNOSIS — C259 Malignant neoplasm of pancreas, unspecified: Secondary | ICD-10-CM

## 2014-04-21 MED ORDER — POTASSIUM CHLORIDE CRYS ER 20 MEQ PO TBCR: 20.0000 meq | EXTENDED_RELEASE_TABLET | Freq: Two times a day (BID) | ORAL | Status: DC

## 2014-04-22 ENCOUNTER — Ambulatory Visit: Payer: 59

## 2014-04-26 ENCOUNTER — Telehealth: Payer: Self-pay | Admitting: *Deleted

## 2014-04-26 ENCOUNTER — Ambulatory Visit
Admission: RE | Admit: 2014-04-26 | Discharge: 2014-04-26 | Disposition: A | Discharge status: Home / Self Care | Payer: 59 | Source: Ambulatory Visit | Attending: Oncology | Admitting: Oncology

## 2014-04-26 ENCOUNTER — Other Ambulatory Visit: Payer: Self-pay | Admitting: Oncology

## 2014-04-26 ENCOUNTER — Other Ambulatory Visit (HOSPITAL_BASED_OUTPATIENT_CLINIC_OR_DEPARTMENT_OTHER): Payer: 59

## 2014-04-26 DIAGNOSIS — C250 Malignant neoplasm of head of pancreas: Secondary | ICD-10-CM

## 2014-04-26 DIAGNOSIS — C259 Malignant neoplasm of pancreas, unspecified: Secondary | ICD-10-CM

## 2014-04-26 LAB — CBC WITH DIFFERENTIAL/PLATELET
BASO%: 0.6 % (ref 0.0–2.0)
Basophils Absolute: 0 10*3/uL (ref 0.0–0.1)
EOS%: 1.4 % (ref 0.0–7.0)
Eosinophils Absolute: 0.1 10*3/uL (ref 0.0–0.5)
HCT: 35.7 % (ref 34.8–46.6)
HEMOGLOBIN: 11.1 g/dL — AB (ref 11.6–15.9)
LYMPH#: 1.6 10*3/uL (ref 0.9–3.3)
LYMPH%: 40.5 % (ref 14.0–49.7)
MCH: 26.2 pg (ref 25.1–34.0)
MCHC: 31.2 g/dL — ABNORMAL LOW (ref 31.5–36.0)
MCV: 83.8 fL (ref 79.5–101.0)
MONO#: 0.4 10*3/uL (ref 0.1–0.9)
MONO%: 9.4 % (ref 0.0–14.0)
NEUT%: 48.1 % (ref 38.4–76.8)
NEUTROS ABS: 1.8 10*3/uL (ref 1.5–6.5)
Platelets: 189 10*3/uL (ref 145–400)
RBC: 4.26 10*6/uL (ref 3.70–5.45)
RDW: 17.2 % — ABNORMAL HIGH (ref 11.2–14.5)
WBC: 3.8 10*3/uL — ABNORMAL LOW (ref 3.9–10.3)

## 2014-04-26 LAB — COMPREHENSIVE METABOLIC PANEL (CC13)
ALBUMIN: 3.6 g/dL (ref 3.5–5.0)
ALK PHOS: 170 U/L — AB (ref 40–150)
ALT: 13 U/L (ref 0–55)
AST: 16 U/L (ref 5–34)
Anion Gap: 10 mEq/L (ref 3–11)
BUN: 11.7 mg/dL (ref 7.0–26.0)
CALCIUM: 9.2 mg/dL (ref 8.4–10.4)
CHLORIDE: 105 meq/L (ref 98–109)
CO2: 25 mEq/L (ref 22–29)
Creatinine: 1 mg/dL (ref 0.6–1.1)
Glucose: 98 mg/dl (ref 70–140)
Potassium: 4.1 mEq/L (ref 3.5–5.1)
SODIUM: 141 meq/L (ref 136–145)
TOTAL PROTEIN: 6.5 g/dL (ref 6.4–8.3)
Total Bilirubin: 0.32 mg/dL (ref 0.20–1.20)

## 2014-04-26 LAB — CEA: CEA: 6.7 ng/mL — ABNORMAL HIGH (ref 0.0–5.0)

## 2014-04-26 MED ORDER — IOHEXOL 350 MG/ML SOLN
125.0000 mL | Freq: Once | INTRAVENOUS | Status: AC | PRN
  Administered 2014-04-26: 125 mL via INTRAVENOUS

## 2014-04-26 NOTE — Telephone Encounter (Signed)
Frances [name] called requesting authorization to perform a pancreatic protocol as requested by Dr, Alvester Chou.  Patient on table of [facility] at this time.  This protocol will obtain a better view of the pancreas.  Verbal order received and read back from Dr. Benay Spice to proceed with the pancreatic protocol.  Frances [name] Given order at this time.

## 2014-04-29 ENCOUNTER — Ambulatory Visit (HOSPITAL_BASED_OUTPATIENT_CLINIC_OR_DEPARTMENT_OTHER): Payer: 59 | Admitting: Nurse Practitioner

## 2014-04-29 ENCOUNTER — Ambulatory Visit (HOSPITAL_BASED_OUTPATIENT_CLINIC_OR_DEPARTMENT_OTHER): Payer: 59

## 2014-04-29 ENCOUNTER — Telehealth: Payer: Self-pay | Admitting: Oncology

## 2014-04-29 ENCOUNTER — Other Ambulatory Visit: Payer: Self-pay | Admitting: Oncology

## 2014-04-29 ENCOUNTER — Other Ambulatory Visit: Payer: 59

## 2014-04-29 VITALS — BP 132/93 | HR 76 | Temp 97.0°F | Resp 18 | Ht 65.0 in | Wt 214.8 lb

## 2014-04-29 DIAGNOSIS — C250 Malignant neoplasm of head of pancreas: Secondary | ICD-10-CM

## 2014-04-29 DIAGNOSIS — Z5111 Encounter for antineoplastic chemotherapy: Secondary | ICD-10-CM

## 2014-04-29 DIAGNOSIS — C259 Malignant neoplasm of pancreas, unspecified: Secondary | ICD-10-CM

## 2014-04-29 DIAGNOSIS — G609 Hereditary and idiopathic neuropathy, unspecified: Secondary | ICD-10-CM

## 2014-04-29 MED ORDER — SODIUM CHLORIDE 0.9 % IV SOLN
2400.0000 mg/m2 | INTRAVENOUS | Status: DC
  Administered 2014-04-29: 5100 mg via INTRAVENOUS
  Filled 2014-04-29: qty 102

## 2014-04-29 MED ORDER — DEXAMETHASONE SODIUM PHOSPHATE 20 MG/5ML IJ SOLN
20.0000 mg | Freq: Once | INTRAMUSCULAR | Status: AC
  Administered 2014-04-29: 20 mg via INTRAVENOUS

## 2014-04-29 MED ORDER — ATROPINE SULFATE 1 MG/ML IJ SOLN
0.5000 mg | Freq: Once | INTRAMUSCULAR | Status: AC | PRN
  Administered 2014-04-29: 0.5 mg via INTRAVENOUS

## 2014-04-29 MED ORDER — PALONOSETRON HCL INJECTION 0.25 MG/5ML
INTRAVENOUS | Status: AC
  Filled 2014-04-29: qty 5

## 2014-04-29 MED ORDER — DEXTROSE 5 % IV SOLN
850.0000 mg | Freq: Once | INTRAVENOUS | Status: AC
  Administered 2014-04-29: 850 mg via INTRAVENOUS
  Filled 2014-04-29: qty 42.5

## 2014-04-29 MED ORDER — PALONOSETRON HCL INJECTION 0.25 MG/5ML
0.2500 mg | Freq: Once | INTRAVENOUS | Status: AC
  Administered 2014-04-29: 0.25 mg via INTRAVENOUS

## 2014-04-29 MED ORDER — IRINOTECAN HCL CHEMO INJECTION 100 MG/5ML
180.0000 mg/m2 | Freq: Once | INTRAVENOUS | Status: AC
  Administered 2014-04-29: 380 mg via INTRAVENOUS
  Filled 2014-04-29: qty 19

## 2014-04-29 MED ORDER — ATROPINE SULFATE 1 MG/ML IJ SOLN
INTRAMUSCULAR | Status: AC
  Filled 2014-04-29: qty 1

## 2014-04-29 MED ORDER — DEXAMETHASONE SODIUM PHOSPHATE 20 MG/5ML IJ SOLN
INTRAMUSCULAR | Status: AC
  Filled 2014-04-29: qty 5

## 2014-04-29 NOTE — Progress Notes (Addendum)
[place] OFFICE PROGRESS NOTE   Diagnosis: Pancreas cancer.   INTERVAL HISTORY:   Frances [name] returns as scheduled. She completed a cycle of FOLFIRI on 04/08/2014. No significant nausea. No mouth sores. Diarrhea is less. She notes dryness over the hands. She notes increased neuropathy involving the hands and feet. No abdominal pain.  Objective:  Vital signs in last 24 hours:  Blood pressure 132/93, pulse 76, temperature 97 F (36.1 C), temperature source Oral, resp. rate 18, height 5\' 5"  (1.651 m), weight 214 lb 12.8 oz (97.433 kg), SpO2 100.00%.    HEENT: No thrush or ulcerations. Resp: Lungs clear. Cardio: Regular cardiac rhythm. GI: Abdomen soft and nontender. No hepatomegaly. No mass. Vascular: No leg edema.  Skin: Palms with skin thickening and hyperpigmentation.  Portacath site without erythema.    Lab Results:  Lab Results  Component Value Date   WBC 3.8* 04/26/2014   HGB 11.1* 04/26/2014   HCT 35.7 04/26/2014   MCV 83.8 04/26/2014   PLT 189 04/26/2014   NEUTROABS 1.8 04/26/2014    Imaging:  No results found.  Medications: I have reviewed the patient's current medications.  Assessment/Plan: 1. 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 Initiation of FOLFIRINOX 10/29/2013. She did not complete the 5-FU pump due to developing altered speech. Cycle 2 completed 11/12/2013. Cycle 3 completed 11/26/2013. Cycle 4 completed 12/09/2013, cycle 5 12/24/2013, cycle 6 01/14/2014, cycle 7 01/28/2014, cycle 8 02/11/2014, cycle 9 02/25/2014, cycle 10 03/11/2014 Restaging CT 01/06/2012 with a slight increase in the size of the pancreas mass and no clear evidence of distant metastatic disease.  CEA improved 01/28/2014.  Cycle 1 of modified FOLFIRI 03/25/2014. Cycle 2 modified FOLFIRI 04/08/2014. CEA stable 04/26/2014. Restaging CT abdomen/pelvis 04/26/2014 with interval decrease in the size of the pancreatic head mass. Stable mesenteric and  retroperitoneal lymphadenopathy. Stable splenomegaly. 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 Stable small liver lesions consistent with benign cysts. 2. Altered speech prior to completion of cycle 1 of FOLFIRINOX, similar symptoms and tongue/lip twitching following the oxaliplatin with cycle 2 FOLFIRINOX. Oxaliplatin was dose reduced and given over 4 hours beginning with cycle 3. She had similar neurologic symptoms after subsequent cycles of FOLFIRINOX. 3. Status post cholecystectomy 10/02/2013 with pathology revealing chronic cholecystitis. 4. Abdomen/back pain secondary to #1. Improved 5. Hypertension. 6. Status post Port-A-Cath placement 10/28/2013. 7. History of elevated liver enzyme-improved. 8. History of Hypokalemia. Taking a potassium supplement. 9. History of neutropenia secondary to chemotherapy. Neulasta was added beginning with cycle 6 on 01/14/2014. 10. Delayed nausea. Improved with the addition of Aloxi. 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 12. Oral candidiasis 04/08/2014. She completed a course of Diflucan and Mycelex thrush.  Disposition: Frances [name] appears stable. She is tolerating the FOLFIRI well. Restaging CT evaluation shows improvement in the size of the pancreatic head mass and no new disease.  Dr. Benay Spice reviewed the CT result with Frances [name]. Plan to proceed with treatment today as scheduled. Interval will be changed from every 2 weeks to every 3 weeks. She will return for the next cycle on 05/20/2014. She will contact the office in the interim with any problems.  Patient seen with Dr. Benay Spice.    Owens Shark ANP/GNP-BC   04/29/2014  11:21 AM   This was a shared visit with Ned Card. The restaging CT reveals a decrease in the pancreas mass and no evidence of progressive metastatic disease.  The plan is to  continue FOLFIRI chemotherapy. Chemotherapy will be switched to a 3 week  schedule after treatment today.  Julieanne Manson, M.D.

## 2014-04-29 NOTE — [facility] Oncology Navigator Note (Signed)
Met with patient during infusion.  She is in good spirits as she had positive news regarding her scans today.  She and her husband expressed appreciation for the care she has received.  Frances [name] has an interest in attending the GI Cancer support group.  She was given information on the next group meeting planned for June 15th.  She denies need for further assist at this time and acknowledged she would call if necessary.  Will continue to follow.

## 2014-04-29 NOTE — Patient Instructions (Signed)
[facility] Discharge Instructions for Patients Receiving Chemotherapy  Today you received the following chemotherapy agents: 5FU, Irinotecan, Leucovorin.  To help prevent nausea and vomiting after your treatment, we encourage you to take your nausea medication.     If you develop nausea and vomiting that is not controlled by your nausea medication, call the clinic.   BELOW ARE SYMPTOMS THAT SHOULD BE REPORTED IMMEDIATELY:  *FEVER GREATER THAN 100.5 F  *CHILLS WITH OR WITHOUT FEVER  NAUSEA AND VOMITING THAT IS NOT CONTROLLED WITH YOUR NAUSEA MEDICATION  *UNUSUAL SHORTNESS OF BREATH  *UNUSUAL BRUISING OR BLEEDING  TENDERNESS IN MOUTH AND THROAT WITH OR WITHOUT PRESENCE OF ULCERS  *URINARY PROBLEMS  *BOWEL PROBLEMS  UNUSUAL RASH Items with * indicate a potential emergency and should be followed up as soon as possible.  Feel free to call the clinic you have any questions or concerns. The clinic phone number is [phone].    

## 2014-04-29 NOTE — Telephone Encounter (Signed)
gv adn printed appt scehd and avs for pt for May thru July...sed added tx.

## 2014-05-01 ENCOUNTER — Ambulatory Visit (HOSPITAL_BASED_OUTPATIENT_CLINIC_OR_DEPARTMENT_OTHER): Payer: 59

## 2014-05-01 VITALS — BP 132/76 | HR 79 | Temp 98.3°F | Resp 18

## 2014-05-01 DIAGNOSIS — C250 Malignant neoplasm of head of pancreas: Secondary | ICD-10-CM

## 2014-05-01 DIAGNOSIS — Z5189 Encounter for other specified aftercare: Secondary | ICD-10-CM

## 2014-05-01 MED ORDER — PEGFILGRASTIM INJECTION 6 MG/0.6ML
6.0000 mg | Freq: Once | SUBCUTANEOUS | Status: AC
  Administered 2014-05-01: 6 mg via SUBCUTANEOUS

## 2014-05-01 MED ORDER — SODIUM CHLORIDE 0.9 % IJ SOLN
10.0000 mL | INTRAMUSCULAR | Status: DC | PRN
  Administered 2014-05-01: 10 mL
  Filled 2014-05-01: qty 10

## 2014-05-01 MED ORDER — HEPARIN SOD (PORK) LOCK FLUSH 100 UNIT/ML IV SOLN
500.0000 [IU] | Freq: Once | INTRAVENOUS | Status: AC | PRN
  Administered 2014-05-01: 500 [IU]
  Filled 2014-05-01: qty 5

## 2014-05-01 NOTE — Patient Instructions (Addendum)
[facility] Discharge Instructions for Patients Receiving Chemotherapy  Today you received the following chemotherapy agents 5FU pump disconnect.  To help prevent nausea and vomiting after your treatment, we encourage you to take your nausea medication phenergan every 6 hours.   If you develop nausea and vomiting that is not controlled by your nausea medication, call the clinic.   BELOW ARE SYMPTOMS THAT SHOULD BE REPORTED IMMEDIATELY:  *FEVER GREATER THAN 100.5 F  *CHILLS WITH OR WITHOUT FEVER  NAUSEA AND VOMITING THAT IS NOT CONTROLLED WITH YOUR NAUSEA MEDICATION  *UNUSUAL SHORTNESS OF BREATH  *UNUSUAL BRUISING OR BLEEDING  TENDERNESS IN MOUTH AND THROAT WITH OR WITHOUT PRESENCE OF ULCERS  *URINARY PROBLEMS  *BOWEL PROBLEMS  UNUSUAL RASH Items with * indicate a potential emergency and should be followed up as soon as possible.  Feel free to call the clinic you have any questions or concerns. The clinic phone number is [phone].

## 2014-05-04 ENCOUNTER — Telehealth: Payer: Self-pay | Admitting: *Deleted

## 2014-05-04 NOTE — Telephone Encounter (Signed)
Returned call to pt, she does not notice a pattern to when the headaches start. Informed her they may be related to chemo pre-meds. Call office if headaches persist, OTC remedy does not help. She voiced understanding. Pt will call office if she needs prescription pain med.

## 2014-05-04 NOTE — Telephone Encounter (Signed)
Received phone call from patient stating that she has had a headache "off and on" since Saturday with little relief.  She said the pain was located in her temple areas and that she has taken Advil and Tylenol. She said that those 2 meds did not help. She stated her BP is normal and that she has been eating and drinking regularly.  She denies blurred vision, numbness/tingling, nausea/vomiting or other symptoms.  "It's just a nagging headache that is NOT sinus" she stated.  She said she has had this in the past, but that it never lasted this long.  Will inform Dr. Benay Spice and his nurse.

## 2014-05-05 ENCOUNTER — Encounter: Payer: Self-pay | Admitting: Oncology

## 2014-05-05 NOTE — Progress Notes (Signed)
Per Barnett Applebaum she got a message to put in note the patient's Navigator at [email or web address].[email or web address] Cree  [phone] ext [phone]

## 2014-05-16 ENCOUNTER — Other Ambulatory Visit: Payer: Self-pay | Admitting: Oncology

## 2014-05-20 ENCOUNTER — Telehealth: Payer: Self-pay | Admitting: *Deleted

## 2014-05-20 ENCOUNTER — Telehealth: Payer: Self-pay | Admitting: Oncology

## 2014-05-20 ENCOUNTER — Inpatient Hospital Stay: Payer: 59

## 2014-05-20 ENCOUNTER — Other Ambulatory Visit (HOSPITAL_BASED_OUTPATIENT_CLINIC_OR_DEPARTMENT_OTHER): Payer: 59

## 2014-05-20 ENCOUNTER — Ambulatory Visit (HOSPITAL_BASED_OUTPATIENT_CLINIC_OR_DEPARTMENT_OTHER): Payer: 59 | Admitting: Nurse Practitioner

## 2014-05-20 VITALS — BP 134/89 | HR 70 | Temp 92.8°F | Resp 19 | Ht 65.0 in | Wt 217.3 lb

## 2014-05-20 DIAGNOSIS — C259 Malignant neoplasm of pancreas, unspecified: Secondary | ICD-10-CM

## 2014-05-20 DIAGNOSIS — C250 Malignant neoplasm of head of pancreas: Secondary | ICD-10-CM

## 2014-05-20 DIAGNOSIS — I10 Essential (primary) hypertension: Secondary | ICD-10-CM

## 2014-05-20 LAB — CBC WITH DIFFERENTIAL/PLATELET
BASO%: 1.1 % (ref 0.0–2.0)
Basophils Absolute: 0 10*3/uL (ref 0.0–0.1)
EOS%: 1.3 % (ref 0.0–7.0)
Eosinophils Absolute: 0 10*3/uL (ref 0.0–0.5)
HEMATOCRIT: 33.6 % — AB (ref 34.8–46.6)
HGB: 10.6 g/dL — ABNORMAL LOW (ref 11.6–15.9)
LYMPH%: 45.4 % (ref 14.0–49.7)
MCH: 26.7 pg (ref 25.1–34.0)
MCHC: 31.5 g/dL (ref 31.5–36.0)
MCV: 84.7 fL (ref 79.5–101.0)
MONO#: 0.3 10*3/uL (ref 0.1–0.9)
MONO%: 13 % (ref 0.0–14.0)
NEUT#: 1 10*3/uL — ABNORMAL LOW (ref 1.5–6.5)
NEUT%: 39.2 % (ref 38.4–76.8)
Platelets: 168 10*3/uL (ref 145–400)
RBC: 3.96 10*6/uL (ref 3.70–5.45)
RDW: 16.4 % — ABNORMAL HIGH (ref 11.2–14.5)
WBC: 2.4 10*3/uL — ABNORMAL LOW (ref 3.9–10.3)
lymph#: 1.1 10*3/uL (ref 0.9–3.3)

## 2014-05-20 LAB — COMPREHENSIVE METABOLIC PANEL (CC13)
ALT: 9 U/L (ref 0–55)
ANION GAP: 6 meq/L (ref 3–11)
AST: 15 U/L (ref 5–34)
Albumin: 3.4 g/dL — ABNORMAL LOW (ref 3.5–5.0)
Alkaline Phosphatase: 141 U/L (ref 40–150)
BUN: 12.2 mg/dL (ref 7.0–26.0)
CO2: 27 mEq/L (ref 22–29)
CREATININE: 0.9 mg/dL (ref 0.6–1.1)
Calcium: 8.7 mg/dL (ref 8.4–10.4)
Chloride: 109 mEq/L (ref 98–109)
Glucose: 99 mg/dl (ref 70–140)
Potassium: 4 mEq/L (ref 3.5–5.1)
Sodium: 142 mEq/L (ref 136–145)
Total Bilirubin: 0.29 mg/dL (ref 0.20–1.20)
Total Protein: 6.2 g/dL — ABNORMAL LOW (ref 6.4–8.3)

## 2014-05-20 NOTE — Telephone Encounter (Signed)
gave pt appt for lab and MD , emailed Sharyn Lull for chemo

## 2014-05-20 NOTE — Telephone Encounter (Signed)
gave pt appt for lab and Md , emailed michelle regarding

## 2014-05-20 NOTE — Telephone Encounter (Signed)
Talked to pt and she is aware of appt  for june and july and she will call Sharyn Lull regarding chemo for 7/2

## 2014-05-20 NOTE — Telephone Encounter (Signed)
Per staff message and POF I have scheduled appts.  JMW  

## 2014-05-20 NOTE — Progress Notes (Signed)
  [place] OFFICE PROGRESS NOTE   Diagnosis:  Pancreas cancer.  INTERVAL HISTORY:   [name] returns as scheduled. She completed a cycle of FOLFIRI on 04/29/2014. She had mild nausea. No vomiting. No significant diarrhea. No mouth sores. She has persistent numbness/tingling in the fingertips and feet. She denies abdominal pain. She has a good appetite. She noted achy neck and extremity pain following the Neulasta injection. She denies fever, cough and shortness of breath. She had some leg swelling last week after prolonged standing. The swelling resolved. No calf pain.  Objective:  Vital signs in last 24 hours:  Blood pressure 134/89, pulse 70, temperature 92.8 F (33.8 C), temperature source Oral, resp. rate 19, height 5\' 5"  (1.651 m), weight 217 lb 4.8 oz (98.567 kg), SpO2 100.00%.    HEENT: No thrush or ulcerations. Resp: Lungs clear. Cardio: Regular cardiac rhythm. GI: Abdomen soft and nontender. No organomegaly. No mass. Vascular: No leg edema. Superficial varicosities right leg. Calves nontender.  Port-A-Cath site without erythema.    Lab Results:  Lab Results  Component Value Date   WBC 2.4* 05/20/2014   HGB 10.6* 05/20/2014   HCT 33.6* 05/20/2014   MCV 84.7 05/20/2014   PLT 168 05/20/2014   NEUTROABS 1.0* 05/20/2014    Imaging:  No results found.  Medications: I have reviewed the patient's current medications.  Assessment/Plan: 1. 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 Initiation of FOLFIRINOX 10/29/2013. She did not complete the 5-FU pump due to developing altered speech. Cycle 2 completed 11/12/2013. Cycle 3 completed 11/26/2013. Cycle 4 completed 12/09/2013, cycle 5 12/24/2013, cycle 6 01/14/2014, cycle 7 01/28/2014, cycle 8 02/11/2014, cycle 9 02/25/2014, cycle 10 03/11/2014 Restaging CT 01/06/2012 with a slight increase in the size of the pancreas mass and no clear evidence of distant metastatic disease.  CEA improved  01/28/2014.  Cycle 1 of modified FOLFIRI 03/25/2014.  Cycle 2 modified FOLFIRI 04/08/2014.  CEA stable 04/26/2014.  Restaging CT abdomen/pelvis 04/26/2014 with interval decrease in the size of the pancreatic head mass. Stable mesenteric and retroperitoneal lymphadenopathy. Stable splenomegaly. 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 Stable small liver lesions consistent with benign cysts. Continuation of FOLFIRI on a 3 week schedule. 2. Altered speech prior to completion of cycle 1 of FOLFIRINOX, similar symptoms and tongue/lip twitching following the oxaliplatin with cycle 2 FOLFIRINOX. Oxaliplatin was dose reduced and given over 4 hours beginning with cycle 3. She had similar neurologic symptoms after subsequent cycles of FOLFIRINOX. 3. Status post cholecystectomy 10/02/2013 with pathology revealing chronic cholecystitis. 4. Abdomen/back pain secondary to #1. Improved 5. Hypertension. 6. Status post Port-A-Cath placement 10/28/2013. 7. History of elevated liver enzyme-improved. 8. History of Hypokalemia. Taking a potassium supplement. 9. History of neutropenia secondary to chemotherapy. Neulasta was added beginning with cycle 6 on 01/14/2014. 10. Delayed nausea. Improved with the addition of Aloxi. 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 12. Oral candidiasis 04/08/2014. She completed a course of Diflucan and Mycelex troche.   Disposition: Frances [name] appears stable. She is neutropenic on labs today. We will hold FOLFIRI today and reschedule for one week. We will see her in followup on 06/17/2014. She will contact the office in the interim with any problems. We specifically discussed fever, signs of infection.  Plan reviewed with Dr. Benay Spice.    Ned Card ANP/GNP-BC   05/20/2014  9:29 AM

## 2014-05-22 ENCOUNTER — Ambulatory Visit: Payer: 59

## 2014-05-25 ENCOUNTER — Telehealth: Payer: Self-pay | Admitting: *Deleted

## 2014-05-25 NOTE — Telephone Encounter (Signed)
Patient called regarding her appt time for this week. I have called and gave her the time. We also canceled appt for 7/2   JMW

## 2014-05-27 ENCOUNTER — Inpatient Hospital Stay: Payer: 59

## 2014-05-27 ENCOUNTER — Telehealth: Payer: Self-pay | Admitting: *Deleted

## 2014-05-27 ENCOUNTER — Encounter: Payer: Self-pay | Admitting: Nurse Practitioner

## 2014-05-27 ENCOUNTER — Other Ambulatory Visit (HOSPITAL_BASED_OUTPATIENT_CLINIC_OR_DEPARTMENT_OTHER): Payer: 59

## 2014-05-27 DIAGNOSIS — C250 Malignant neoplasm of head of pancreas: Secondary | ICD-10-CM

## 2014-05-27 DIAGNOSIS — C259 Malignant neoplasm of pancreas, unspecified: Secondary | ICD-10-CM

## 2014-05-27 LAB — CBC WITH DIFFERENTIAL/PLATELET
BASO%: 0.5 % (ref 0.0–2.0)
BASOS ABS: 0 10*3/uL (ref 0.0–0.1)
EOS ABS: 0 10*3/uL (ref 0.0–0.5)
EOS%: 0.9 % (ref 0.0–7.0)
HCT: 35.2 % (ref 34.8–46.6)
HEMOGLOBIN: 10.9 g/dL — AB (ref 11.6–15.9)
LYMPH#: 1 10*3/uL (ref 0.9–3.3)
LYMPH%: 48.8 % (ref 14.0–49.7)
MCH: 26 pg (ref 25.1–34.0)
MCHC: 31 g/dL — ABNORMAL LOW (ref 31.5–36.0)
MCV: 83.8 fL (ref 79.5–101.0)
MONO#: 0.2 10*3/uL (ref 0.1–0.9)
MONO%: 10.8 % (ref 0.0–14.0)
NEUT%: 39 % (ref 38.4–76.8)
NEUTROS ABS: 0.8 10*3/uL — AB (ref 1.5–6.5)
Platelets: 165 10*3/uL (ref 145–400)
RBC: 4.2 10*6/uL (ref 3.70–5.45)
RDW: 15.4 % — AB (ref 11.2–14.5)
WBC: 2.1 10*3/uL — ABNORMAL LOW (ref 3.9–10.3)

## 2014-05-27 LAB — COMPREHENSIVE METABOLIC PANEL (CC13)
ALT: 9 U/L (ref 0–55)
ANION GAP: 8 meq/L (ref 3–11)
AST: 13 U/L (ref 5–34)
Albumin: 3.7 g/dL (ref 3.5–5.0)
Alkaline Phosphatase: 157 U/L — ABNORMAL HIGH (ref 40–150)
BUN: 12 mg/dL (ref 7.0–26.0)
CALCIUM: 9.2 mg/dL (ref 8.4–10.4)
CHLORIDE: 108 meq/L (ref 98–109)
CO2: 25 meq/L (ref 22–29)
Creatinine: 0.9 mg/dL (ref 0.6–1.1)
GLUCOSE: 108 mg/dL (ref 70–140)
Potassium: 3.8 mEq/L (ref 3.5–5.1)
Sodium: 141 mEq/L (ref 136–145)
Total Bilirubin: 0.48 mg/dL (ref 0.20–1.20)
Total Protein: 6.7 g/dL (ref 6.4–8.3)

## 2014-05-27 NOTE — Telephone Encounter (Signed)
Per chemo RN I have moved apapts from this week to next week. Chemo RN to give the patient a schedule.

## 2014-05-27 NOTE — Progress Notes (Signed)
Per Dr. Benay Spice- hold treatment today due to [place] 0.8. R/S appointment to next week. Pt given new appointment information and advised regarding neutropenic precautions.

## 2014-05-29 ENCOUNTER — Ambulatory Visit: Payer: 59

## 2014-06-03 ENCOUNTER — Ambulatory Visit: Payer: 59

## 2014-06-03 ENCOUNTER — Other Ambulatory Visit: Payer: Self-pay | Admitting: *Deleted

## 2014-06-03 ENCOUNTER — Other Ambulatory Visit (HOSPITAL_BASED_OUTPATIENT_CLINIC_OR_DEPARTMENT_OTHER): Payer: 59

## 2014-06-03 DIAGNOSIS — C259 Malignant neoplasm of pancreas, unspecified: Secondary | ICD-10-CM

## 2014-06-03 DIAGNOSIS — C250 Malignant neoplasm of head of pancreas: Secondary | ICD-10-CM

## 2014-06-03 LAB — CBC WITH DIFFERENTIAL/PLATELET
BASO%: 1 % (ref 0.0–2.0)
Basophils Absolute: 0 10*3/uL (ref 0.0–0.1)
EOS%: 1.2 % (ref 0.0–7.0)
Eosinophils Absolute: 0 10*3/uL (ref 0.0–0.5)
HCT: 34.8 % (ref 34.8–46.6)
HEMOGLOBIN: 10.9 g/dL — AB (ref 11.6–15.9)
LYMPH#: 1 10*3/uL (ref 0.9–3.3)
LYMPH%: 44.5 % (ref 14.0–49.7)
MCH: 26.3 pg (ref 25.1–34.0)
MCHC: 31.2 g/dL — ABNORMAL LOW (ref 31.5–36.0)
MCV: 84.2 fL (ref 79.5–101.0)
MONO#: 0.2 10*3/uL (ref 0.1–0.9)
MONO%: 9.3 % (ref 0.0–14.0)
NEUT%: 44 % (ref 38.4–76.8)
NEUTROS ABS: 1 10*3/uL — AB (ref 1.5–6.5)
Platelets: 155 10*3/uL (ref 145–400)
RBC: 4.14 10*6/uL (ref 3.70–5.45)
RDW: 15.6 % — AB (ref 11.2–14.5)
WBC: 2.2 10*3/uL — ABNORMAL LOW (ref 3.9–10.3)

## 2014-06-03 LAB — COMPREHENSIVE METABOLIC PANEL (CC13)
ALBUMIN: 3.6 g/dL (ref 3.5–5.0)
ALT: 10 U/L (ref 0–55)
AST: 15 U/L (ref 5–34)
Alkaline Phosphatase: 154 U/L — ABNORMAL HIGH (ref 40–150)
Anion Gap: 8 mEq/L (ref 3–11)
BUN: 12.1 mg/dL (ref 7.0–26.0)
CALCIUM: 9 mg/dL (ref 8.4–10.4)
CHLORIDE: 106 meq/L (ref 98–109)
CO2: 28 mEq/L (ref 22–29)
Creatinine: 0.9 mg/dL (ref 0.6–1.1)
Glucose: 100 mg/dl (ref 70–140)
POTASSIUM: 3.6 meq/L (ref 3.5–5.1)
Sodium: 141 mEq/L (ref 136–145)
TOTAL PROTEIN: 6.6 g/dL (ref 6.4–8.3)
Total Bilirubin: 0.39 mg/dL (ref 0.20–1.20)

## 2014-06-03 LAB — CEA: CEA: 13.8 ng/mL — ABNORMAL HIGH (ref 0.0–5.0)

## 2014-06-03 NOTE — Progress Notes (Signed)
Patient ANC: 1.0. Dr. Benay Spice notified. Instructed to hold patient treatment. Will return 06/17/14 for treatment. Patient verbalized understanding.

## 2014-06-04 ENCOUNTER — Telehealth: Payer: Self-pay | Admitting: Oncology

## 2014-06-04 NOTE — Telephone Encounter (Signed)
Talked to pt appt and gave her appt for July 2015

## 2014-06-05 ENCOUNTER — Ambulatory Visit: Payer: 59

## 2014-06-10 ENCOUNTER — Inpatient Hospital Stay: Payer: 59

## 2014-06-15 ENCOUNTER — Other Ambulatory Visit: Payer: Self-pay | Admitting: *Deleted

## 2014-06-15 DIAGNOSIS — C259 Malignant neoplasm of pancreas, unspecified: Secondary | ICD-10-CM

## 2014-06-16 ENCOUNTER — Other Ambulatory Visit: Payer: Self-pay | Admitting: Oncology

## 2014-06-17 ENCOUNTER — Other Ambulatory Visit (HOSPITAL_BASED_OUTPATIENT_CLINIC_OR_DEPARTMENT_OTHER): Payer: 59

## 2014-06-17 ENCOUNTER — Ambulatory Visit (HOSPITAL_BASED_OUTPATIENT_CLINIC_OR_DEPARTMENT_OTHER): Payer: 59

## 2014-06-17 ENCOUNTER — Telehealth: Payer: Self-pay | Admitting: Oncology

## 2014-06-17 ENCOUNTER — Ambulatory Visit (HOSPITAL_BASED_OUTPATIENT_CLINIC_OR_DEPARTMENT_OTHER): Payer: 59 | Admitting: Oncology

## 2014-06-17 VITALS — BP 133/93 | HR 74 | Temp 98.4°F | Resp 18 | Ht 65.0 in | Wt 213.5 lb

## 2014-06-17 DIAGNOSIS — C250 Malignant neoplasm of head of pancreas: Secondary | ICD-10-CM

## 2014-06-17 DIAGNOSIS — C259 Malignant neoplasm of pancreas, unspecified: Secondary | ICD-10-CM

## 2014-06-17 DIAGNOSIS — Z5111 Encounter for antineoplastic chemotherapy: Secondary | ICD-10-CM

## 2014-06-17 DIAGNOSIS — G620 Drug-induced polyneuropathy: Secondary | ICD-10-CM

## 2014-06-17 DIAGNOSIS — I10 Essential (primary) hypertension: Secondary | ICD-10-CM

## 2014-06-17 DIAGNOSIS — M549 Dorsalgia, unspecified: Secondary | ICD-10-CM

## 2014-06-17 DIAGNOSIS — E876 Hypokalemia: Secondary | ICD-10-CM

## 2014-06-17 LAB — CBC WITH DIFFERENTIAL/PLATELET
BASO%: 1 % (ref 0.0–2.0)
Basophils Absolute: 0 10*3/uL (ref 0.0–0.1)
EOS%: 1.7 % (ref 0.0–7.0)
Eosinophils Absolute: 0 10*3/uL (ref 0.0–0.5)
HCT: 36.3 % (ref 34.8–46.6)
HGB: 11.3 g/dL — ABNORMAL LOW (ref 11.6–15.9)
LYMPH%: 46.1 % (ref 14.0–49.7)
MCH: 26.2 pg (ref 25.1–34.0)
MCHC: 31.2 g/dL — ABNORMAL LOW (ref 31.5–36.0)
MCV: 84.2 fL (ref 79.5–101.0)
MONO#: 0.2 10*3/uL (ref 0.1–0.9)
MONO%: 7 % (ref 0.0–14.0)
NEUT#: 1.1 10*3/uL — ABNORMAL LOW (ref 1.5–6.5)
NEUT%: 44.2 % (ref 38.4–76.8)
Platelets: 134 10*3/uL — ABNORMAL LOW (ref 145–400)
RBC: 4.31 10*6/uL (ref 3.70–5.45)
RDW: 15.5 % — ABNORMAL HIGH (ref 11.2–14.5)
WBC: 2.4 10*3/uL — AB (ref 3.9–10.3)
lymph#: 1.1 10*3/uL (ref 0.9–3.3)

## 2014-06-17 LAB — COMPREHENSIVE METABOLIC PANEL (CC13)
ALK PHOS: 149 U/L (ref 40–150)
ALT: 11 U/L (ref 0–55)
AST: 16 U/L (ref 5–34)
Albumin: 3.7 g/dL (ref 3.5–5.0)
Anion Gap: 8 mEq/L (ref 3–11)
BILIRUBIN TOTAL: 0.41 mg/dL (ref 0.20–1.20)
BUN: 9.7 mg/dL (ref 7.0–26.0)
CO2: 27 mEq/L (ref 22–29)
CREATININE: 0.9 mg/dL (ref 0.6–1.1)
Calcium: 9.3 mg/dL (ref 8.4–10.4)
Chloride: 107 mEq/L (ref 98–109)
Glucose: 98 mg/dl (ref 70–140)
Potassium: 3.8 mEq/L (ref 3.5–5.1)
SODIUM: 143 meq/L (ref 136–145)
TOTAL PROTEIN: 6.7 g/dL (ref 6.4–8.3)

## 2014-06-17 MED ORDER — DEXAMETHASONE SODIUM PHOSPHATE 20 MG/5ML IJ SOLN
INTRAMUSCULAR | Status: AC
  Filled 2014-06-17: qty 5

## 2014-06-17 MED ORDER — LIDOCAINE-PRILOCAINE 2.5-2.5 % EX CREA
TOPICAL_CREAM | CUTANEOUS | Status: AC
  Filled 2014-06-17: qty 5

## 2014-06-17 MED ORDER — PALONOSETRON HCL INJECTION 0.25 MG/5ML
INTRAVENOUS | Status: AC
  Filled 2014-06-17: qty 5

## 2014-06-17 MED ORDER — PALONOSETRON HCL INJECTION 0.25 MG/5ML
0.2500 mg | Freq: Once | INTRAVENOUS | Status: AC
Start: 2014-06-17 — End: 2014-06-17
  Administered 2014-06-17: 0.25 mg via INTRAVENOUS

## 2014-06-17 MED ORDER — ATROPINE SULFATE 1 MG/ML IJ SOLN
INTRAMUSCULAR | Status: AC
  Filled 2014-06-17: qty 1

## 2014-06-17 MED ORDER — LEUCOVORIN CALCIUM INJECTION 350 MG
850.0000 mg | Freq: Once | INTRAVENOUS | Status: AC
  Administered 2014-06-17: 850 mg via INTRAVENOUS
  Filled 2014-06-17: qty 42.5

## 2014-06-17 MED ORDER — SODIUM CHLORIDE 0.9 % IV SOLN
Freq: Once | INTRAVENOUS | Status: AC
  Administered 2014-06-17: 10:00:00 via INTRAVENOUS

## 2014-06-17 MED ORDER — ATROPINE SULFATE 1 MG/ML IJ SOLN
0.5000 mg | Freq: Once | INTRAMUSCULAR | Status: AC | PRN
Start: 2014-06-17 — End: 2014-06-17
  Administered 2014-06-17: 0.5 mg via INTRAVENOUS

## 2014-06-17 MED ORDER — FLUOROURACIL CHEMO INJECTION 5 GM/100ML
2400.0000 mg/m2 | INTRAVENOUS | Status: DC
  Administered 2014-06-17: 5100 mg via INTRAVENOUS
  Filled 2014-06-17: qty 102

## 2014-06-17 MED ORDER — IRINOTECAN HCL CHEMO INJECTION 100 MG/5ML
142.0000 mg/m2 | Freq: Once | INTRAVENOUS | Status: AC
  Administered 2014-06-17: 300 mg via INTRAVENOUS
  Filled 2014-06-17: qty 15

## 2014-06-17 MED ORDER — DEXAMETHASONE SODIUM PHOSPHATE 20 MG/5ML IJ SOLN
20.0000 mg | Freq: Once | INTRAMUSCULAR | Status: AC
  Administered 2014-06-17: 20 mg via INTRAVENOUS

## 2014-06-17 NOTE — Patient Instructions (Signed)
[facility] Discharge Instructions for Patients Receiving Chemotherapy  Today you received the following chemotherapy agents: Irinotecan, Leucovorin, 5FU   To help prevent nausea and vomiting after your treatment, we encourage you to take your nausea medication as prescribed.    If you develop nausea and vomiting that is not controlled by your nausea medication, call the clinic.   BELOW ARE SYMPTOMS THAT SHOULD BE REPORTED IMMEDIATELY:  *FEVER GREATER THAN 100.5 F  *CHILLS WITH OR WITHOUT FEVER  NAUSEA AND VOMITING THAT IS NOT CONTROLLED WITH YOUR NAUSEA MEDICATION  *UNUSUAL SHORTNESS OF BREATH  *UNUSUAL BRUISING OR BLEEDING  TENDERNESS IN MOUTH AND THROAT WITH OR WITHOUT PRESENCE OF ULCERS  *URINARY PROBLEMS  *BOWEL PROBLEMS  UNUSUAL RASH Items with * indicate a potential emergency and should be followed up as soon as possible.  Feel free to call the clinic you have any questions or concerns. The clinic phone number is [phone].    

## 2014-06-17 NOTE — Telephone Encounter (Signed)
gv and printed appts ched and avs for pt for July and Aug...sed added tx. °

## 2014-06-17 NOTE — Progress Notes (Signed)
  [facility] OFFICE PROGRESS NOTE   Diagnosis: Pancreas cancer  INTERVAL HISTORY:   [name] returns as scheduled. She was last treated with FOLFIRI 04/29/2014. She reports mild bone pain for several days after receiving Neulasta. No nausea or diarrhea. Chemotherapy has been delayed secondary to neutropenia. She reports a "nagging "back pain. No dominant pain. Stable neuropathy symptoms at the hands and feet.  Objective:  Vital signs in last 24 hours:  Blood pressure 133/93, pulse 74, temperature 98.4 F (36.9 C), temperature source Oral, resp. rate 18, height 5\' 5"  (1.651 m), weight 213 lb 8 oz (96.843 kg).    HEENT: No thrush or ulcer Resp: Lungs clear bilaterally Cardio: Regular rate and rhythm GI: No hepatosplenomegaly, nontender, no mass Vascular: No leg edema   Portacath/PICC-without erythema  Lab Results:  Lab Results  Component Value Date   WBC 2.4* 06/17/2014   HGB 11.3* 06/17/2014   HCT 36.3 06/17/2014   MCV 84.2 06/17/2014   PLT 134* 06/17/2014   NEUTROABS 1.1* 06/17/2014    Lab Results  Component Value Date   CEA 13.8* 06/03/2014    Medications: I have reviewed the patient's current medications.  Assessment/Plan: 1. 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 Initiation of FOLFIRINOX 10/29/2013. She did not complete the 5-FU pump due to developing altered speech. Cycle 2 completed 11/12/2013. Cycle 3 completed 11/26/2013. Cycle 4 completed 12/09/2013, cycle 5 12/24/2013, cycle 6 01/14/2014, cycle 7 01/28/2014, cycle 8 02/11/2014, cycle 9 02/25/2014, cycle 10 03/11/2014 Restaging CT 01/06/2012 with a slight increase in the size of the pancreas mass and no clear evidence of distant metastatic disease.  CEA improved 01/28/2014.  Cycle 1 of modified FOLFIRI 03/25/2014.  Cycle 2 modified FOLFIRI 04/08/2014.  CEA stable 04/26/2014.  Restaging CT abdomen/pelvis 04/26/2014 with interval decrease in the size of the pancreatic head mass.  Stable mesenteric and retroperitoneal lymphadenopathy. Stable splenomegaly. 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 Stable small liver lesions consistent with benign cysts.  Continuation of FOLFIRI on a 3 week schedule. 2. Altered speech prior to completion of cycle 1 of FOLFIRINOX, similar symptoms and tongue/lip twitching following the oxaliplatin with cycle 2 FOLFIRINOX. Oxaliplatin was dose reduced and given over 4 hours beginning with cycle 3. She had similar neurologic symptoms after subsequent cycles of FOLFIRINOX. 3. Status post cholecystectomy 10/02/2013 with pathology revealing chronic cholecystitis. 4. Abdomen/back pain secondary to #1. Improved 5. Hypertension. 6. Status post Port-A-Cath placement 10/28/2013. 7. History of elevated liver enzyme-improved. 8. History of Hypokalemia. Taking a potassium supplement. 9. History of neutropenia secondary to chemotherapy. Neulasta was added beginning with cycle 6 on 01/14/2014. 10. Delayed nausea. Improved with the addition of Aloxi. 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   Disposition:  Her overall status appears unchanged. However I am concerned she now has mild back pain and the CEA was higher last month. The neutrophil count has stabilized. The low white count is likely related to a cumulative effect from chemotherapy. We decided to proceed with FOLFIRI today with a dose reduction. She will receive Neulasta and she will return for a nadir CBC next week. She knows to contact us for a fever.  The plan is to consider Xeloda/radiation or gemcitabine/Abraxane if there is clear evidence of disease progression.  Betsy Coder, MD  06/17/2014  8:43 AM

## 2014-06-17 NOTE — Progress Notes (Signed)
Per Dr. Benay Spice  : OK to treat today with low ANC. Will add Neulasta and dose reduce chemo.

## 2014-06-18 ENCOUNTER — Other Ambulatory Visit: Payer: Self-pay | Admitting: *Deleted

## 2014-06-18 ENCOUNTER — Telehealth: Payer: Self-pay | Admitting: *Deleted

## 2014-06-18 MED ORDER — POTASSIUM CHLORIDE CRYS ER 20 MEQ PO TBCR: 20.0000 meq | EXTENDED_RELEASE_TABLET | Freq: Two times a day (BID) | ORAL | Status: DC

## 2014-06-18 NOTE — Telephone Encounter (Signed)
Message from pt requesting potassium refill to be sent to her local pharmacy. Will need about a week's supply to cover until mail order arrives.

## 2014-06-18 NOTE — Telephone Encounter (Signed)
Received phone call from patient stating that she is out of [place].  She normally receives this by mail order pharmacy.  She is requesting enough pills to get her through at least a week until she receives another shipment.  Dr. Gearldine Shown nurse informed and will  arrange.

## 2014-06-19 ENCOUNTER — Ambulatory Visit: Payer: 59

## 2014-06-19 ENCOUNTER — Ambulatory Visit (HOSPITAL_BASED_OUTPATIENT_CLINIC_OR_DEPARTMENT_OTHER): Payer: 59

## 2014-06-19 VITALS — BP 143/95 | HR 73 | Temp 98.4°F | Resp 18

## 2014-06-19 DIAGNOSIS — Z5189 Encounter for other specified aftercare: Secondary | ICD-10-CM

## 2014-06-19 DIAGNOSIS — C250 Malignant neoplasm of head of pancreas: Secondary | ICD-10-CM

## 2014-06-19 MED ORDER — HEPARIN SOD (PORK) LOCK FLUSH 100 UNIT/ML IV SOLN
500.0000 [IU] | Freq: Once | INTRAVENOUS | Status: AC | PRN
  Administered 2014-06-19: 500 [IU]
  Filled 2014-06-19: qty 5

## 2014-06-19 MED ORDER — PEGFILGRASTIM INJECTION 6 MG/0.6ML
6.0000 mg | Freq: Once | SUBCUTANEOUS | Status: AC
  Administered 2014-06-19: 6 mg via SUBCUTANEOUS

## 2014-06-19 MED ORDER — SODIUM CHLORIDE 0.9 % IJ SOLN
10.0000 mL | INTRAMUSCULAR | Status: DC | PRN
  Administered 2014-06-19: 10 mL
  Filled 2014-06-19: qty 10

## 2014-06-19 NOTE — Progress Notes (Signed)
Patient received injection with pump dc encounter.

## 2014-06-25 ENCOUNTER — Other Ambulatory Visit (HOSPITAL_BASED_OUTPATIENT_CLINIC_OR_DEPARTMENT_OTHER): Payer: 59

## 2014-06-25 DIAGNOSIS — C259 Malignant neoplasm of pancreas, unspecified: Secondary | ICD-10-CM

## 2014-06-25 DIAGNOSIS — C250 Malignant neoplasm of head of pancreas: Secondary | ICD-10-CM

## 2014-06-25 DIAGNOSIS — E876 Hypokalemia: Secondary | ICD-10-CM

## 2014-06-25 LAB — CBC WITH DIFFERENTIAL/PLATELET
BASO%: 0.2 % (ref 0.0–2.0)
Basophils Absolute: 0 10*3/uL (ref 0.0–0.1)
EOS%: 1.5 % (ref 0.0–7.0)
Eosinophils Absolute: 0.2 10*3/uL (ref 0.0–0.5)
HCT: 36.9 % (ref 34.8–46.6)
HGB: 11.5 g/dL — ABNORMAL LOW (ref 11.6–15.9)
LYMPH#: 2 10*3/uL (ref 0.9–3.3)
LYMPH%: 18.7 % (ref 14.0–49.7)
MCH: 26.1 pg (ref 25.1–34.0)
MCHC: 31.2 g/dL — AB (ref 31.5–36.0)
MCV: 83.5 fL (ref 79.5–101.0)
MONO#: 0.7 10*3/uL (ref 0.1–0.9)
MONO%: 7.1 % (ref 0.0–14.0)
NEUT#: 7.5 10*3/uL — ABNORMAL HIGH (ref 1.5–6.5)
NEUT%: 72.5 % (ref 38.4–76.8)
Platelets: 175 10*3/uL (ref 145–400)
RBC: 4.42 10*6/uL (ref 3.70–5.45)
RDW: 15.3 % — AB (ref 11.2–14.5)
WBC: 10.4 10*3/uL — ABNORMAL HIGH (ref 3.9–10.3)

## 2014-07-04 ENCOUNTER — Other Ambulatory Visit: Payer: Self-pay | Admitting: Oncology

## 2014-07-08 ENCOUNTER — Telehealth: Payer: Self-pay | Admitting: *Deleted

## 2014-07-08 ENCOUNTER — Ambulatory Visit (HOSPITAL_BASED_OUTPATIENT_CLINIC_OR_DEPARTMENT_OTHER): Payer: 59 | Admitting: Nurse Practitioner

## 2014-07-08 ENCOUNTER — Other Ambulatory Visit (HOSPITAL_BASED_OUTPATIENT_CLINIC_OR_DEPARTMENT_OTHER): Payer: 59

## 2014-07-08 ENCOUNTER — Ambulatory Visit (HOSPITAL_BASED_OUTPATIENT_CLINIC_OR_DEPARTMENT_OTHER): Payer: 59

## 2014-07-08 ENCOUNTER — Telehealth: Payer: Self-pay | Admitting: Nurse Practitioner

## 2014-07-08 VITALS — BP 131/83 | HR 75 | Temp 98.3°F | Resp 18 | Ht 65.0 in | Wt 214.7 lb

## 2014-07-08 DIAGNOSIS — R141 Gas pain: Secondary | ICD-10-CM

## 2014-07-08 DIAGNOSIS — C259 Malignant neoplasm of pancreas, unspecified: Secondary | ICD-10-CM

## 2014-07-08 DIAGNOSIS — M549 Dorsalgia, unspecified: Secondary | ICD-10-CM

## 2014-07-08 DIAGNOSIS — Z5111 Encounter for antineoplastic chemotherapy: Secondary | ICD-10-CM

## 2014-07-08 DIAGNOSIS — R143 Flatulence: Secondary | ICD-10-CM

## 2014-07-08 DIAGNOSIS — C250 Malignant neoplasm of head of pancreas: Secondary | ICD-10-CM

## 2014-07-08 DIAGNOSIS — R142 Eructation: Secondary | ICD-10-CM

## 2014-07-08 LAB — CBC WITH DIFFERENTIAL/PLATELET
BASO%: 0.8 % (ref 0.0–2.0)
BASOS ABS: 0 10*3/uL (ref 0.0–0.1)
EOS ABS: 0 10*3/uL (ref 0.0–0.5)
EOS%: 0.8 % (ref 0.0–7.0)
HCT: 36.2 % (ref 34.8–46.6)
HEMOGLOBIN: 11.3 g/dL — AB (ref 11.6–15.9)
LYMPH#: 1 10*3/uL (ref 0.9–3.3)
LYMPH%: 43.7 % (ref 14.0–49.7)
MCH: 26.1 pg (ref 25.1–34.0)
MCHC: 31.3 g/dL — ABNORMAL LOW (ref 31.5–36.0)
MCV: 83.3 fL (ref 79.5–101.0)
MONO#: 0.2 10*3/uL (ref 0.1–0.9)
MONO%: 8.2 % (ref 0.0–14.0)
NEUT%: 46.5 % (ref 38.4–76.8)
NEUTROS ABS: 1.1 10*3/uL — AB (ref 1.5–6.5)
Platelets: 177 10*3/uL (ref 145–400)
RBC: 4.34 10*6/uL (ref 3.70–5.45)
RDW: 15.4 % — AB (ref 11.2–14.5)
WBC: 2.4 10*3/uL — AB (ref 3.9–10.3)

## 2014-07-08 LAB — COMPREHENSIVE METABOLIC PANEL (CC13)
ALBUMIN: 3.5 g/dL (ref 3.5–5.0)
ALK PHOS: 142 U/L (ref 40–150)
ALT: 8 U/L (ref 0–55)
AST: 15 U/L (ref 5–34)
Anion Gap: 7 mEq/L (ref 3–11)
BUN: 6.8 mg/dL — ABNORMAL LOW (ref 7.0–26.0)
CALCIUM: 9.3 mg/dL (ref 8.4–10.4)
CHLORIDE: 106 meq/L (ref 98–109)
CO2: 28 mEq/L (ref 22–29)
Creatinine: 0.9 mg/dL (ref 0.6–1.1)
GLUCOSE: 106 mg/dL (ref 70–140)
POTASSIUM: 4 meq/L (ref 3.5–5.1)
Sodium: 140 mEq/L (ref 136–145)
Total Bilirubin: 0.34 mg/dL (ref 0.20–1.20)
Total Protein: 6.5 g/dL (ref 6.4–8.3)

## 2014-07-08 LAB — CEA: CEA: 23.3 ng/mL — ABNORMAL HIGH (ref 0.0–5.0)

## 2014-07-08 MED ORDER — LEUCOVORIN CALCIUM INJECTION 350 MG
850.0000 mg | Freq: Once | INTRAVENOUS | Status: AC
  Administered 2014-07-08: 850 mg via INTRAVENOUS
  Filled 2014-07-08: qty 42.5

## 2014-07-08 MED ORDER — PALONOSETRON HCL INJECTION 0.25 MG/5ML
INTRAVENOUS | Status: AC
Start: 2014-07-08 — End: 2014-07-08
  Filled 2014-07-08: qty 5

## 2014-07-08 MED ORDER — SODIUM CHLORIDE 0.9 % IV SOLN
INTRAVENOUS | Status: DC
  Administered 2014-07-08: 11:00:00 via INTRAVENOUS

## 2014-07-08 MED ORDER — SODIUM CHLORIDE 0.9 % IV SOLN
2400.0000 mg/m2 | INTRAVENOUS | Status: DC
  Administered 2014-07-08: 5100 mg via INTRAVENOUS
  Filled 2014-07-08: qty 102

## 2014-07-08 MED ORDER — ATROPINE SULFATE 1 MG/ML IJ SOLN
0.5000 mg | Freq: Once | INTRAMUSCULAR | Status: AC | PRN
  Administered 2014-07-08: 0.5 mg via INTRAVENOUS

## 2014-07-08 MED ORDER — ATROPINE SULFATE 1 MG/ML IJ SOLN
INTRAMUSCULAR | Status: AC
Start: 2014-07-08 — End: 2014-07-08
  Filled 2014-07-08: qty 1

## 2014-07-08 MED ORDER — PALONOSETRON HCL INJECTION 0.25 MG/5ML
0.2500 mg | Freq: Once | INTRAVENOUS | Status: AC
  Administered 2014-07-08: 0.25 mg via INTRAVENOUS

## 2014-07-08 MED ORDER — DEXAMETHASONE SODIUM PHOSPHATE 20 MG/5ML IJ SOLN
INTRAMUSCULAR | Status: AC
  Filled 2014-07-08: qty 5

## 2014-07-08 MED ORDER — IRINOTECAN HCL CHEMO INJECTION 100 MG/5ML
142.0000 mg/m2 | Freq: Once | INTRAVENOUS | Status: AC
  Administered 2014-07-08: 300 mg via INTRAVENOUS
  Filled 2014-07-08: qty 15

## 2014-07-08 MED ORDER — DEXAMETHASONE SODIUM PHOSPHATE 20 MG/5ML IJ SOLN
20.0000 mg | Freq: Once | INTRAMUSCULAR | Status: AC
  Administered 2014-07-08: 20 mg via INTRAVENOUS

## 2014-07-08 NOTE — Telephone Encounter (Signed)
Per [facility]  pharmacy request 

## 2014-07-08 NOTE — Progress Notes (Signed)
[facility] OFFICE PROGRESS NOTE   Diagnosis:  Pancreas cancer.  INTERVAL HISTORY:    Ms. Dunbar returns as scheduled. She was last treated with FOLFIRI on 06/17/2014. She denies nausea/vomiting. No diarrhea. No mouth sores. She has felt bloated intermittently for the past week at the upper abdomen. She noted some improvement with an antacid. She has a good appetite. She had some back pain over the weekend. She took Dilaudid last night. The pain is better today. She noted achiness for several days following Neulasta. Stable neuropathy symptoms involving the hands and feet.  Objective:  Vital signs in last 24 hours:  Blood pressure 131/83, pulse 75, temperature 98.3 F (36.8 C), temperature source Oral, resp. rate 18, height 5\' 5"  (1.651 m), weight 214 lb 11.2 oz (97.387 kg).    HEENT: No thrush or ulcerations.  Resp: Lungs clear. Cardio: Regular cardiac rhythm. GI: Abdomen soft and nontender. No mass. No organomegaly. Vascular: No leg edema.    Port-A-Cath site without erythema.   Lab Results:  Lab Results  Component Value Date   WBC 2.4* 07/08/2014   HGB 11.3* 07/08/2014   HCT 36.2 07/08/2014   MCV 83.3 07/08/2014   PLT 177 07/08/2014   NEUTROABS 1.1* 07/08/2014    Imaging:  No results found.  Medications: I have reviewed the patient's current medications.  Assessment/Plan: 1. 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 Initiation of FOLFIRINOX 10/29/2013. She did not complete the 5-FU pump due to developing altered speech. Cycle 2 completed 11/12/2013. Cycle 3 completed 11/26/2013. Cycle 4 completed 12/09/2013, cycle 5 12/24/2013, cycle 6 01/14/2014, cycle 7 01/28/2014, cycle 8 02/11/2014, cycle 9 02/25/2014, cycle 10 03/11/2014 Restaging CT 01/06/2012 with a slight increase in the size of the pancreas mass and no clear evidence of distant metastatic disease.  CEA improved 01/28/2014.  Cycle 1 of modified FOLFIRI 03/25/2014.  Cycle 2  modified FOLFIRI 04/08/2014.  CEA stable 04/26/2014.  Restaging CT abdomen/pelvis 04/26/2014 with interval decrease in the size of the pancreatic head mass. Stable mesenteric and retroperitoneal lymphadenopathy. Stable splenomegaly. 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 Stable small liver lesions consistent with benign cysts.  Continuation of FOLFIRI on a 3 week schedule. CEA increased (13.8) on 06/03/2014. 2. Altered speech prior to completion of cycle 1 of FOLFIRINOX, similar symptoms and tongue/lip twitching following the oxaliplatin with cycle 2 FOLFIRINOX. Oxaliplatin was dose reduced and given over 4 hours beginning with cycle 3. She had similar neurologic symptoms after subsequent cycles of FOLFIRINOX. 3. Status post cholecystectomy 10/02/2013 with pathology revealing chronic cholecystitis. 4. Abdomen/back pain secondary to #1. Improved 5. Hypertension. 6. Status post Port-A-Cath placement 10/28/2013. 7. History of elevated liver enzyme-improved. 8. History of hypokalemia. Taking a potassium supplement. 9. History of neutropenia secondary to chemotherapy. Neulasta was added beginning with cycle 6 on 01/14/2014. 10. Delayed nausea. Improved with the addition of Aloxi. 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    Disposition: Ms. Gutman appears stable. Plan to proceed with FOLFIRI today as scheduled with Neulasta on the day of pump discontinuation.   We will followup on the CEA from today. If the CEA is higher we will proceed with a restaging CT evaluation prior to her next visit.   She continues to have mild intermittent back pain and is also having some abdominal bloating. She will contact the office if symptoms worsen.  She will return for a followup visit in 3 weeks. She knows to contact the office with fever, chills,  other signs of infection.  Plan reviewed with Dr. Benay Spice.    Ned Card ANP/GNP-BC   07/08/2014  10:03  AM

## 2014-07-08 NOTE — Telephone Encounter (Signed)
Per [facility] staff message scheduled appts. Advised scheduler 

## 2014-07-08 NOTE — Patient Instructions (Signed)
Leucovorin injection What is this medicine? LEUCOVORIN (loo koe VOR in) is used to prevent or treat the harmful effects of some medicines. This medicine is used to treat anemia caused by a low amount of folic acid in the body. It is also used with 5-fluorouracil (5-FU) to treat colon cancer. This medicine may be used for other purposes; ask your health care provider or pharmacist if you have questions. What should I tell my health care provider before I take this medicine? They need to know if you have any of these conditions: -anemia from low levels of vitamin B-12 in the blood -an unusual or allergic reaction to leucovorin, folic acid, other medicines, foods, dyes, or preservatives -pregnant or trying to get pregnant -breast-feeding How should I use this medicine? This medicine is for injection into a muscle or into a vein. It is given by a health care professional in a hospital or clinic setting. Talk to your pediatrician regarding the use of this medicine in children. Special care may be needed. Overdosage: If you think you have taken too much of this medicine contact a poison control center or emergency room at once. NOTE: This medicine is only for you. Do not share this medicine with others. What if I miss a dose? This does not apply. What may interact with this medicine? -capecitabine -fluorouracil -phenobarbital -phenytoin -primidone -trimethoprim-sulfamethoxazole This list may not describe all possible interactions. Give your health care provider a list of all the medicines, herbs, non-prescription drugs, or dietary supplements you use. Also tell them if you smoke, drink alcohol, or use illegal drugs. Some items may interact with your medicine. What should I watch for while using this medicine? Your condition will be monitored carefully while you are receiving this medicine. This medicine may increase the side effects of 5-fluorouracil, 5-FU. Tell your doctor or health care  professional if you have diarrhea or mouth sores that do not get better or that get worse. What side effects may I notice from receiving this medicine? Side effects that you should report to your doctor or health care professional as soon as possible: -allergic reactions like skin rash, itching or hives, swelling of the face, lips, or tongue -breathing problems -fever, infection -mouth sores -unusual bleeding or bruising -unusually weak or tired Side effects that usually do not require medical attention (report to your doctor or health care professional if they continue or are bothersome): -constipation or diarrhea -loss of appetite -nausea, vomiting This list may not describe all possible side effects. Call your doctor for medical advice about side effects. You may report side effects to FDA at 1-800-FDA-1088. Where should I keep my medicine? This drug is given in a hospital or clinic and will not be stored at home. NOTE: This sheet is a summary. It may not cover all possible information. If you have questions about this medicine, talk to your doctor, pharmacist, or health care provider.  2015, Elsevier/Gold Standard. (2008-06-01 16:50:29) Fluorouracil, 5-FU injection What is this medicine? FLUOROURACIL, 5-FU (flure oh YOOR a sil) is a chemotherapy drug. It slows the growth of cancer cells. This medicine is used to treat many types of cancer like breast cancer, colon or rectal cancer, pancreatic cancer, and stomach cancer. This medicine may be used for other purposes; ask your health care provider or pharmacist if you have questions. COMMON BRAND NAME(S): Adrucil What should I tell my health care provider before I take this medicine? They need to know if you have any of these conditions: -  blood disorders -dihydropyrimidine dehydrogenase (DPD) deficiency -infection (especially a virus infection such as chickenpox, cold sores, or herpes) -kidney disease -liver disease -malnourished,  poor nutrition -recent or ongoing radiation therapy -an unusual or allergic reaction to fluorouracil, other chemotherapy, other medicines, foods, dyes, or preservatives -pregnant or trying to get pregnant -breast-feeding How should I use this medicine? This drug is given as an infusion or injection into a vein. It is administered in a hospital or clinic by a specially trained health care professional. Talk to your pediatrician regarding the use of this medicine in children. Special care may be needed. Overdosage: If you think you have taken too much of this medicine contact a poison control center or emergency room at once. NOTE: This medicine is only for you. Do not share this medicine with others. What if I miss a dose? It is important not to miss your dose. Call your doctor or health care professional if you are unable to keep an appointment. What may interact with this medicine? -allopurinol -cimetidine -dapsone -digoxin -hydroxyurea -leucovorin -levamisole -medicines for seizures like ethotoin, fosphenytoin, phenytoin -medicines to increase blood counts like filgrastim, pegfilgrastim, sargramostim -medicines that treat or prevent blood clots like warfarin, enoxaparin, and dalteparin -methotrexate -metronidazole -pyrimethamine -some other chemotherapy drugs like busulfan, cisplatin, estramustine, vinblastine -trimethoprim -trimetrexate -vaccines Talk to your doctor or health care professional before taking any of these medicines: -acetaminophen -aspirin -ibuprofen -ketoprofen -naproxen This list may not describe all possible interactions. Give your health care provider a list of all the medicines, herbs, non-prescription drugs, or dietary supplements you use. Also tell them if you smoke, drink alcohol, or use illegal drugs. Some items may interact with your medicine. What should I watch for while using this medicine? Visit your doctor for checks on your progress. This drug  may make you feel generally unwell. This is not uncommon, as chemotherapy can affect healthy cells as well as cancer cells. Report any side effects. Continue your course of treatment even though you feel ill unless your doctor tells you to stop. In some cases, you may be given additional medicines to help with side effects. Follow all directions for their use. Call your doctor or health care professional for advice if you get a fever, chills or sore throat, or other symptoms of a cold or flu. Do not treat yourself. This drug decreases your body's ability to fight infections. Try to avoid being around people who are sick. This medicine may increase your risk to bruise or bleed. Call your doctor or health care professional if you notice any unusual bleeding. Be careful brushing and flossing your teeth or using a toothpick because you may get an infection or bleed more easily. If you have any dental work done, tell your dentist you are receiving this medicine. Avoid taking products that contain aspirin, acetaminophen, ibuprofen, naproxen, or ketoprofen unless instructed by your doctor. These medicines may hide a fever. Do not become pregnant while taking this medicine. Women should inform their doctor if they wish to become pregnant or think they might be pregnant. There is a potential for serious side effects to an unborn child. Talk to your health care professional or pharmacist for more information. Do not breast-feed an infant while taking this medicine. Men should inform their doctor if they wish to father a child. This medicine may lower sperm counts. Do not treat diarrhea with over the counter products. Contact your doctor if you have diarrhea that lasts more than 2 days or if it  is severe and watery. This medicine can make you more sensitive to the sun. Keep out of the sun. If you cannot avoid being in the sun, wear protective clothing and use sunscreen. Do not use sun lamps or tanning  beds/booths. What side effects may I notice from receiving this medicine? Side effects that you should report to your doctor or health care professional as soon as possible: -allergic reactions like skin rash, itching or hives, swelling of the face, lips, or tongue -low blood counts - this medicine may decrease the number of white blood cells, red blood cells and platelets. You may be at increased risk for infections and bleeding. -signs of infection - fever or chills, cough, sore throat, pain or difficulty passing urine -signs of decreased platelets or bleeding - bruising, pinpoint red spots on the skin, black, tarry stools, blood in the urine -signs of decreased red blood cells - unusually weak or tired, fainting spells, lightheadedness -breathing problems -changes in vision -chest pain -mouth sores -nausea and vomiting -pain, swelling, redness at site where injected -pain, tingling, numbness in the hands or feet -redness, swelling, or sores on hands or feet -stomach pain -unusual bleeding Side effects that usually do not require medical attention (report to your doctor or health care professional if they continue or are bothersome): -changes in finger or toe nails -diarrhea -dry or itchy skin -hair loss -headache -loss of appetite -sensitivity of eyes to the light -stomach upset -unusually teary eyes This list may not describe all possible side effects. Call your doctor for medical advice about side effects. You may report side effects to FDA at 1-800-FDA-1088. Where should I keep my medicine? This drug is given in a hospital or clinic and will not be stored at home. NOTE: This sheet is a summary. It may not cover all possible information. If you have questions about this medicine, talk to your doctor, pharmacist, or health care provider.  2015, Elsevier/Gold Standard. (2008-03-31 13:53:16) Irinotecan injection What is this medicine? IRINOTECAN (ir in oh TEE kan ) is a  chemotherapy drug. It is used to treat colon and rectal cancer. This medicine may be used for other purposes; ask your health care provider or pharmacist if you have questions. COMMON BRAND NAME(S): Camptosar What should I tell my health care provider before I take this medicine? They need to know if you have any of these conditions: -blood disorders -dehydration -diarrhea -infection (especially a virus infection such as chickenpox, cold sores, or herpes) -liver disease -low blood counts, like low white cell, platelet, or red cell counts -recent or ongoing radiation therapy -an unusual or allergic reaction to irinotecan, sorbitol, other chemotherapy, other medicines, foods, dyes, or preservatives -pregnant or trying to get pregnant -breast-feeding How should I use this medicine? This drug is given as an infusion into a vein. It is administered in a hospital or clinic by a specially trained health care professional. Talk to your pediatrician regarding the use of this medicine in children. Special care may be needed. Overdosage: If you think you have taken too much of this medicine contact a poison control center or emergency room at once. NOTE: This medicine is only for you. Do not share this medicine with others. What if I miss a dose? It is important not to miss your dose. Call your doctor or health care professional if you are unable to keep an appointment. What may interact with this medicine? Do not take this medicine with any of the following medications: -atazanavir -  certain medicines for fungal infections like itraconazole and ketoconazole -St. John's Wort This medicine may also interact with the following medications: -dexamethasone -diuretics -laxatives -medicines for seizures like carbamazepine, mephobarbital, phenobarbital, phenytoin, primidone -medicines to increase blood counts like filgrastim, pegfilgrastim, sargramostim -prochlorperazine -vaccines This list may not  describe all possible interactions. Give your health care provider a list of all the medicines, herbs, non-prescription drugs, or dietary supplements you use. Also tell them if you smoke, drink alcohol, or use illegal drugs. Some items may interact with your medicine. What should I watch for while using this medicine? Your condition will be monitored carefully while you are receiving this medicine. You will need important blood work done while you are taking this medicine. This drug may make you feel generally unwell. This is not uncommon, as chemotherapy can affect healthy cells as well as cancer cells. Report any side effects. Continue your course of treatment even though you feel ill unless your doctor tells you to stop. In some cases, you may be given additional medicines to help with side effects. Follow all directions for their use. You may get drowsy or dizzy. Do not drive, use machinery, or do anything that needs mental alertness until you know how this medicine affects you. Do not stand or sit up quickly, especially if you are an older patient. This reduces the risk of dizzy or fainting spells. Call your doctor or health care professional for advice if you get a fever, chills or sore throat, or other symptoms of a cold or flu. Do not treat yourself. This drug decreases your body's ability to fight infections. Try to avoid being around people who are sick. This medicine may increase your risk to bruise or bleed. Call your doctor or health care professional if you notice any unusual bleeding. Be careful brushing and flossing your teeth or using a toothpick because you may get an infection or bleed more easily. If you have any dental work done, tell your dentist you are receiving this medicine. Avoid taking products that contain aspirin, acetaminophen, ibuprofen, naproxen, or ketoprofen unless instructed by your doctor. These medicines may hide a fever. Do not become pregnant while taking this  medicine. Women should inform their doctor if they wish to become pregnant or think they might be pregnant. There is a potential for serious side effects to an unborn child. Talk to your health care professional or pharmacist for more information. Do not breast-feed an infant while taking this medicine. What side effects may I notice from receiving this medicine? Side effects that you should report to your doctor or health care professional as soon as possible: -allergic reactions like skin rash, itching or hives, swelling of the face, lips, or tongue -low blood counts - this medicine may decrease the number of white blood cells, red blood cells and platelets. You may be at increased risk for infections and bleeding. -signs of infection - fever or chills, cough, sore throat, pain or difficulty passing urine -signs of decreased platelets or bleeding - bruising, pinpoint red spots on the skin, black, tarry stools, blood in the urine -signs of decreased red blood cells - unusually weak or tired, fainting spells, lightheadedness -breathing problems -chest pain -diarrhea -feeling faint or lightheaded, falls -flushing, runny nose, sweating during infusion -mouth sores or pain -pain, swelling, redness or irritation where injected -pain, swelling, warmth in the leg -pain, tingling, numbness in the hands or feet -problems with balance, talking, walking -stomach cramps, pain -trouble passing urine or change  in the amount of urine -vomiting as to be unable to hold down drinks or food -yellowing of the eyes or skin Side effects that usually do not require medical attention (report to your doctor or health care professional if they continue or are bothersome): -constipation -hair loss -headache -loss of appetite -nausea, vomiting -stomach upset This list may not describe all possible side effects. Call your doctor for medical advice about side effects. You may report side effects to FDA at  1-800-FDA-1088. Where should I keep my medicine? This drug is given in a hospital or clinic and will not be stored at home. NOTE: This sheet is a summary. It may not cover all possible information. If you have questions about this medicine, talk to your doctor, pharmacist, or health care provider.  2015, Elsevier/Gold Standard. (2013-05-25 16:29:32)

## 2014-07-08 NOTE — Telephone Encounter (Signed)
Pt confirmed labs/ov per 07/30 POF, gave pt AVS....KJ °

## 2014-07-10 ENCOUNTER — Ambulatory Visit: Payer: 59

## 2014-07-10 ENCOUNTER — Ambulatory Visit (HOSPITAL_BASED_OUTPATIENT_CLINIC_OR_DEPARTMENT_OTHER): Payer: 59

## 2014-07-10 VITALS — BP 135/77 | HR 79 | Temp 98.4°F | Resp 17

## 2014-07-10 DIAGNOSIS — Z5189 Encounter for other specified aftercare: Secondary | ICD-10-CM

## 2014-07-10 DIAGNOSIS — C250 Malignant neoplasm of head of pancreas: Secondary | ICD-10-CM

## 2014-07-10 DIAGNOSIS — C259 Malignant neoplasm of pancreas, unspecified: Secondary | ICD-10-CM

## 2014-07-10 MED ORDER — SODIUM CHLORIDE 0.9 % IJ SOLN
10.0000 mL | INTRAMUSCULAR | Status: DC | PRN
  Administered 2014-07-10: 10 mL
  Filled 2014-07-10: qty 10

## 2014-07-10 MED ORDER — PEGFILGRASTIM INJECTION 6 MG/0.6ML
6.0000 mg | Freq: Once | SUBCUTANEOUS | Status: AC
  Administered 2014-07-10: 6 mg via SUBCUTANEOUS

## 2014-07-10 MED ORDER — HEPARIN SOD (PORK) LOCK FLUSH 100 UNIT/ML IV SOLN
500.0000 [IU] | Freq: Once | INTRAVENOUS | Status: AC | PRN
  Administered 2014-07-10: 500 [IU]
  Filled 2014-07-10: qty 5

## 2014-07-25 ENCOUNTER — Other Ambulatory Visit: Payer: Self-pay | Admitting: Oncology

## 2014-07-26 ENCOUNTER — Telehealth: Payer: Self-pay | Admitting: *Deleted

## 2014-07-26 DIAGNOSIS — C259 Malignant neoplasm of pancreas, unspecified: Secondary | ICD-10-CM

## 2014-07-26 NOTE — Telephone Encounter (Signed)
Message from pt requesting to reschedule appt to next week. Out of town for father in [place]. Asking to reschedule to 08/05/14. Reviewed with Dr. Benay Spice: Orders entered for CT C/A/P prior to next visit. Appt with Lattie Haw 8/27 @ 1115.

## 2014-07-27 ENCOUNTER — Ambulatory Visit: Payer: 59

## 2014-07-28 ENCOUNTER — Telehealth: Payer: Self-pay | Admitting: Oncology

## 2014-07-28 ENCOUNTER — Other Ambulatory Visit: Payer: Self-pay | Admitting: *Deleted

## 2014-07-28 ENCOUNTER — Telehealth: Payer: Self-pay | Admitting: *Deleted

## 2014-07-28 NOTE — Telephone Encounter (Signed)
Per staff phone call and POF I have schedueld appts. I have called and gave patient new appts. Patient aware that a scheduler will call her with the CT appt  JMW

## 2014-07-28 NOTE — Telephone Encounter (Signed)
Spk w/Roberta set up apt for referral CT w/GI mailed out to pt.Marland Kitchen..KJ

## 2014-07-29 ENCOUNTER — Ambulatory Visit: Payer: 59

## 2014-07-29 ENCOUNTER — Telehealth: Payer: Self-pay | Admitting: Oncology

## 2014-07-29 ENCOUNTER — Other Ambulatory Visit: Payer: 59

## 2014-07-29 ENCOUNTER — Ambulatory Visit: Payer: 59 | Admitting: Physician Assistant

## 2014-07-29 NOTE — Telephone Encounter (Signed)
Lft msg for pt concerning apt w/GI per 08/19 POF.......Marland Kitchen

## 2014-07-29 NOTE — Telephone Encounter (Signed)
sw. pt and advised on appt....pt ok adn aware

## 2014-07-31 ENCOUNTER — Ambulatory Visit: Payer: 59

## 2014-08-02 ENCOUNTER — Other Ambulatory Visit: Payer: 59

## 2014-08-03 ENCOUNTER — Ambulatory Visit
Admission: RE | Admit: 2014-08-03 | Discharge: 2014-08-03 | Disposition: A | Discharge status: Home / Self Care | Payer: 59 | Source: Ambulatory Visit | Attending: Oncology | Admitting: Oncology

## 2014-08-03 DIAGNOSIS — C259 Malignant neoplasm of pancreas, unspecified: Secondary | ICD-10-CM

## 2014-08-03 MED ORDER — IOHEXOL 300 MG/ML  SOLN
125.0000 mL | Freq: Once | INTRAMUSCULAR | Status: AC | PRN
  Administered 2014-08-03: 125 mL via INTRAVENOUS

## 2014-08-05 ENCOUNTER — Ambulatory Visit: Payer: 59

## 2014-08-05 ENCOUNTER — Other Ambulatory Visit (HOSPITAL_BASED_OUTPATIENT_CLINIC_OR_DEPARTMENT_OTHER): Payer: 59

## 2014-08-05 ENCOUNTER — Ambulatory Visit
Admission: RE | Admit: 2014-08-05 | Discharge: 2014-08-05 | Disposition: A | Discharge status: Home / Self Care | Payer: 59 | Source: Ambulatory Visit | Attending: Radiation Oncology | Admitting: Radiation Oncology

## 2014-08-05 ENCOUNTER — Telehealth: Payer: Self-pay | Admitting: Oncology

## 2014-08-05 ENCOUNTER — Ambulatory Visit (HOSPITAL_BASED_OUTPATIENT_CLINIC_OR_DEPARTMENT_OTHER): Payer: 59 | Admitting: Nurse Practitioner

## 2014-08-05 ENCOUNTER — Encounter: Payer: Self-pay | Admitting: Radiation Oncology

## 2014-08-05 VITALS — BP 141/82 | HR 79 | Temp 98.3°F | Resp 20 | Ht 65.0 in | Wt 211.6 lb

## 2014-08-05 VITALS — BP 141/82 | HR 79 | Temp 98.3°F | Resp 20 | Wt 211.6 lb

## 2014-08-05 DIAGNOSIS — G620 Drug-induced polyneuropathy: Secondary | ICD-10-CM

## 2014-08-05 DIAGNOSIS — C250 Malignant neoplasm of head of pancreas: Secondary | ICD-10-CM | POA: Diagnosis not present

## 2014-08-05 DIAGNOSIS — Z510 Encounter for antineoplastic radiation therapy: Secondary | ICD-10-CM | POA: Diagnosis not present

## 2014-08-05 DIAGNOSIS — C259 Malignant neoplasm of pancreas, unspecified: Secondary | ICD-10-CM

## 2014-08-05 DIAGNOSIS — Z9089 Acquired absence of other organs: Secondary | ICD-10-CM | POA: Insufficient documentation

## 2014-08-05 DIAGNOSIS — R109 Unspecified abdominal pain: Secondary | ICD-10-CM

## 2014-08-05 DIAGNOSIS — M549 Dorsalgia, unspecified: Secondary | ICD-10-CM

## 2014-08-05 LAB — CBC WITH DIFFERENTIAL/PLATELET
BASO%: 0.7 % (ref 0.0–2.0)
BASOS ABS: 0 10*3/uL (ref 0.0–0.1)
EOS ABS: 0.1 10*3/uL (ref 0.0–0.5)
EOS%: 1.3 % (ref 0.0–7.0)
HEMATOCRIT: 36.5 % (ref 34.8–46.6)
HEMOGLOBIN: 11.3 g/dL — AB (ref 11.6–15.9)
LYMPH#: 0.8 10*3/uL — AB (ref 0.9–3.3)
LYMPH%: 19 % (ref 14.0–49.7)
MCH: 25.9 pg (ref 25.1–34.0)
MCHC: 30.9 g/dL — ABNORMAL LOW (ref 31.5–36.0)
MCV: 83.6 fL (ref 79.5–101.0)
MONO#: 0.4 10*3/uL (ref 0.1–0.9)
MONO%: 8.6 % (ref 0.0–14.0)
NEUT%: 70.4 % (ref 38.4–76.8)
NEUTROS ABS: 3.1 10*3/uL (ref 1.5–6.5)
Platelets: 190 10*3/uL (ref 145–400)
RBC: 4.36 10*6/uL (ref 3.70–5.45)
RDW: 16.4 % — ABNORMAL HIGH (ref 11.2–14.5)
WBC: 4.3 10*3/uL (ref 3.9–10.3)

## 2014-08-05 LAB — COMPREHENSIVE METABOLIC PANEL (CC13)
ALT: 14 U/L (ref 0–55)
AST: 16 U/L (ref 5–34)
Albumin: 3.5 g/dL (ref 3.5–5.0)
Alkaline Phosphatase: 155 U/L — ABNORMAL HIGH (ref 40–150)
Anion Gap: 6 mEq/L (ref 3–11)
BUN: 6.9 mg/dL — AB (ref 7.0–26.0)
CHLORIDE: 106 meq/L (ref 98–109)
CO2: 27 mEq/L (ref 22–29)
Calcium: 8.8 mg/dL (ref 8.4–10.4)
Creatinine: 0.8 mg/dL (ref 0.6–1.1)
Glucose: 105 mg/dl (ref 70–140)
Potassium: 3.8 mEq/L (ref 3.5–5.1)
Sodium: 139 mEq/L (ref 136–145)
Total Bilirubin: 0.32 mg/dL (ref 0.20–1.20)
Total Protein: 6.4 g/dL (ref 6.4–8.3)

## 2014-08-05 MED ORDER — HYDROMORPHONE HCL 2 MG PO TABS: 2.0000 mg | ORAL_TABLET | ORAL | Status: DC | PRN

## 2014-08-05 NOTE — Progress Notes (Signed)
Please see the Nurse Progress Note in the MD Initial Consult Encounter for this patient. 

## 2014-08-05 NOTE — Progress Notes (Signed)
GI Location of Tumor / Histology: Pancreas head   Patient presented 9 months ago with symptoms of: heartburn, abdominal pain, bloating epigastric discomfort after eating that transitions to cramping, nausea   Biopsies of pancreas head revealed:  10/12/13  FINE NEEDLE ASPIRATION, ENDOSCOPIC, PANCREAS HEAD(SPECIMEN 1 OF 1 COLLECTED  10/12/13):  MALIGNANT CELLS CONSISTENT WITH ADENOCARCINOMA.   Past/Anticipated interventions by surgeon, if any: lap choley on 10/02/13, Dr Dalbert Batman   Past/Anticipated interventions by medical oncology, if any:  Dr Benay Spice:  Initiation of FOLFIRINOX 10/29/2013. She did not complete the 5-FU pump due to developing altered speech. Cycle 2 completed 11/12/2013. Cycle 3 completed 11/26/2013. Cycle 4 completed 12/09/2013, cycle 5 12/24/2013, cycle 6 01/14/2014, cycle 7 01/28/2014, cycle 8 02/11/2014, cycle 9 02/25/2014, cycle 10 03/11/2014 Restaging CT 01/06/2012 with a slight increase in the size of the pancreas mass and no clear evidence of distant metastatic disease.  CEA improved 01/28/2014.  Cycle 1 of modified FOLFIRI 03/25/2014.  Cycle 2 modified FOLFIRI 04/08/2014.  CEA stable 04/26/2014.  Continuation of FOLFIRI on a 3 week schedule. 2. Altered speech prior to completion of cycle 1 of FOLFIRINOX, similar symptoms and tongue/lip twitching following the oxaliplatin with cycle 2 FOLFIRINOX. Oxaliplatin was dose reduced and given over 4 hours beginning with cycle 3. She had similar neurologic symptoms after subsequent cycles of FOLFIRINOX  Weight changes, if any: no  Bowel/Bladder complaints, if any: none  Nausea / Vomiting, if any: recent nausea, has Phenergan, Compazine prn  Pain issues, if any: Lower abdomen cramping. She states pain was not an issue until recently. She saw med onc today, was prescribed Hydrocodone.    SAFETY ISSUES:  Prior radiation? no  Pacemaker/ICD? no  Possible current pregnancy? Uses IUD, serum pregnancy test on 09/29/13 negative  Is  the patient on methotrexate? No  Current Complaints / other details: Married, 1 son, has worked for [employer] x 20 years.

## 2014-08-05 NOTE — Progress Notes (Signed)
CC: Dr. Julieanne Manson, Dr. 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 Benay Spice for consideration of pale to radiotherapy in the management of her locally advanced adenocarcinoma of the pancreas with vascular invasion. She was initially evaluated in [facility] and felt to have biliary dyskinesia. She has been having intermittent episodes of epigastric pain radiating to her right flank since this past July. She has early satiety and weight loss of approximately 40 pounds over the past 4 months. She was eventually seen by Dr. Dalbert Batman for consideration of cholecystectomy. At the time of laparoscopic cholecystectomy the small bowel root of the mesentery looked "quite full" but without signs of malignancy. A CT scan of the abdomen 10/02/2013 showed a large ill-defined pancreatic mass with pancreatic ductal dilatation with venous occlusion and partial arterial encasement, highly suspicious for malignancy. The pancreatic head mass measured 4.7 x 5.0 cm. There was nonspecific sclerosis of the T11 and L1 vertebral bodies, probably incidental. There were felt to be multiple prominent lymph nodes within the base of the mesentery, somewhat unusual for pancreatic cancer. The possibility of lymphoma was felt to be a consideration. On October 23 Dr. Oretha Caprice performed endoscopic ultrasound with fine-needle aspiration biopsy of the pancreatic mass which shows malignant cells consistent with adenocarcinoma. The pancreatic head mass measured 4.3 x 4.6 cm. The mass directly abutted the SMA and encases the SMV/portal vein confluence. Onto receive FOLFIRINOX chemotherapy on injection of Dr. Benay Spice. Restaging scans on Apr 26 2014 showed interval decrease in the size of her pancreatic head mass. There appear to be stable mesenteric and retroperitoneal lymphadenopathy. Her back pain is improved.  She developed oxaliplatin neuropathy and she was switched to FOLFIRI. A restaging CT scan of the abdomen and pelvis on 08/03/2014 showed mild interval enlargement of the pancreatic head mass with slight increase in the size of small central mesenteric adenopathy. 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 Stable small hypodensities are seen within the liver. She has been to [facility], she is not felt to be resectable. She does have periodic nausea. Her pain is currently as severe as 5/10. She started Dilantin for her abdominal pain. Her pain radiates to her back. Her weight remains stable.  Physical examination: Alert and oriented. No acute distress. Filed Vitals:   08/05/14 1354  BP: 141/82  Pulse: 79  Temp: 98.3 F (36.8 C)  Resp: 20   Head and neck examination: Grossly unremarkable. Nodes: There is no palpable supraclavicular lymphadenopathy. Chest: Lungs clear. Left upper anterior Port-A-Cath.  Abdomen: Soft without masses organomegaly. There is discomfort on palpation in the epigastric region. Extremities: Without edema.  Laboratory data: Lab Results  Component Value Date   WBC 4.3 08/05/2014   HGB 11.3* 08/05/2014   HCT 36.5 08/05/2014   MCV 83.6 08/05/2014   PLT 190 08/05/2014   Impression: T4 N1 adenocarcinoma of the head of the pancreas with recent progression and recurrence of her abdominal pain. I feel that she is a candidate for palliative radiotherapy to deliver approximately 4500 cGy in 25 sessions along with Xeloda chemosensitization. I discussed the potential acute and late toxicities of radiation therapy. She will return for simulation/treatment planning early next week and begin her radiation therapy on Tuesday, September 8. Consent is signed today.  45 minutes was spent face-to-face with the patient, primarily counseling  patient and coordinating her care.

## 2014-08-05 NOTE — Progress Notes (Addendum)
[facility] OFFICE PROGRESS NOTE   Diagnosis:  Pancreas cancer.  INTERVAL HISTORY:   Frances [name] returns as scheduled. She was last treated with FOLFIRI on 07/08/2014. [name] mouth sores. [name] diarrhea. She is experiencing low-back and low abdominal pain. The low abdominal pain is present fairly consistently. The low back pain "comes and goes". She is taking Dilaudid 1-2 times a day. Bowels overall moving regularly. She has noted some mild constipation which she thinks is due to the Dilaudid. She continues to have neuropathy symptoms in the hands and feet.  Objective:  Vital signs in last 24 hours:  Blood pressure 141/82, pulse 79, temperature 98.3 F (36.8 C), temperature source Oral, resp. rate 20, height 5\' 5"  (1.651 m), weight 211 lb 9.6 oz (95.981 kg).    HEENT: [name] thrush or ulcers. Lymphatics: [name] palpable cervical, supraclavicular or axillary lymph nodes. Resp: Lungs clear bilaterally. Cardio: Regular rate and rhythm. GI: Abdomen soft. Tender at the left mid abdomen. [name] mass. [name] organomegaly. Vascular: [name] leg edema.  Port-A-Cath site without erythema.  Lab Results:  Lab Results  Component Value Date   WBC 4.3 08/05/2014   HGB 11.3* 08/05/2014   HCT 36.5 08/05/2014   MCV 83.6 08/05/2014   PLT 190 08/05/2014   NEUTROABS 3.1 08/05/2014    Imaging:  [name] results found.  Medications: I have reviewed the patient's current medications.  Assessment/Plan: 1. 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 Initiation of FOLFIRINOX 10/29/2013. She did not complete the 5-FU pump due to developing altered speech. Cycle 2 completed 11/12/2013. Cycle 3 completed 11/26/2013. Cycle 4 completed 12/09/2013, cycle 5 12/24/2013, cycle 6 01/14/2014, cycle 7 01/28/2014, cycle 8 02/11/2014, cycle 9 02/25/2014, cycle 10 03/11/2014 Restaging CT 01/06/2012 with a slight increase in the size of the pancreas mass and [name] clear evidence of distant metastatic disease.  CEA  improved 01/28/2014.  Cycle 1 of modified FOLFIRI 03/25/2014.  Cycle 2 modified FOLFIRI 04/08/2014.  CEA stable 04/26/2014.  Restaging CT abdomen/pelvis 04/26/2014 with interval decrease in the size of the pancreatic head mass. Stable mesenteric and retroperitoneal lymphadenopathy. Stable splenomegaly. 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 Stable small liver lesions consistent with benign cysts.  Continuation of FOLFIRI on a 3 week schedule.  CEA increased (13.8) on 06/03/2014. CEA increased (23.3) on 07/08/2014. Restaging CT evaluation 08/03/2014 with mild interval enlargement of the pancreatic head mass. Slight increase in the size of small central mesenteric lymph nodes. 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 Common bile duct stent in place without evidence of biliary duct dilatation. Stable small hypodensities within the liver. 2. Altered speech prior to completion of cycle 1 of FOLFIRINOX, similar symptoms and tongue/lip twitching following the oxaliplatin with cycle 2 FOLFIRINOX. Oxaliplatin was dose reduced and given over 4 hours beginning with cycle 3. She had similar neurologic symptoms after subsequent cycles of FOLFIRINOX. 3. Status post cholecystectomy 10/02/2013 with pathology revealing chronic cholecystitis. 4. Abdomen/back pain secondary to #1.  5. Hypertension. 6. Status post Port-A-Cath placement 10/28/2013. 7. History of elevated liver enzyme-improved. 8. History of hypokalemia. Taking a potassium supplement. 9. History of neutropenia secondary to chemotherapy. Neulasta was added beginning with cycle 6 on 01/14/2014. 10. Delayed nausea. Improved with the addition of Aloxi. 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    Disposition: Frances [name] is experiencing abdominal and back pain requiring narcotics. The CEA has increased over the past few months. The recent restaging CT evaluation  shows mild progression with enlargement of the pancreatic head mass. She understands this is the likely source of her pain. Dr. Benay Spice reviewed the CT results with Frances [name] and her husband.  Dr. Benay Spice recommends discontinuation of FOLFIRI and initiation of radiation with concurrent Xeloda.  We reviewed potential toxicities associated with Xeloda including mouth sores, nausea, diarrhea, skin hyperpigmentation, hand-foot syndrome. She has an appointment with Dr. Valere Dross later today to discuss the radiation.  She was provided with a new prescription for Dilaudid.  She will return for a followup visit on 08/30/2014. She will contact the office in the interim with any problems.  Patient seen with Dr. Benay Spice. 30 minutes were spent face-to-face at today's visit with the majority of that time involved in counseling/coordination of care.  Ned Card ANP/GNP-BC   08/05/2014  2:42 PM   This was a shared visit with Ned Card. Frances [name] was interviewed and examined. There is clinical and x-ray evidence of local disease progression. We decided to discontinue FOLFIRI. I discussed the case with Dr. Valere Dross. The plan is to begin concurrent capecitabine and radiation. We reviewed the potential toxicities associated with capecitabine and she agrees to proceed.  We will consider referring her for a celiac block if the pain has not improved following radiation.  Julieanne Manson, M.D.

## 2014-08-05 NOTE — Telephone Encounter (Signed)
gv adn printed appt sched and avs for pt for Sept.... °

## 2014-08-05 NOTE — Progress Notes (Signed)
Met with patient to assess for needs.  She is struggling with work and the demands being placed on her.  Per MD, patient qualifies to remain out of work if she desires.  Treatment regimen to change requiring her to come to [facility] daily.  Referral to SW to assist with decision.  Emotional support was given.

## 2014-08-06 LAB — CEA: CEA: 22.7 ng/mL — ABNORMAL HIGH (ref 0.0–5.0)

## 2014-08-07 ENCOUNTER — Ambulatory Visit: Payer: 59

## 2014-08-09 ENCOUNTER — Other Ambulatory Visit: Payer: Self-pay | Admitting: *Deleted

## 2014-08-09 ENCOUNTER — Ambulatory Visit
Admission: RE | Admit: 2014-08-09 | Discharge: 2014-08-09 | Disposition: A | Discharge status: Home / Self Care | Payer: 59 | Source: Ambulatory Visit | Attending: Radiation Oncology | Admitting: Radiation Oncology

## 2014-08-09 DIAGNOSIS — Z510 Encounter for antineoplastic radiation therapy: Secondary | ICD-10-CM | POA: Diagnosis not present

## 2014-08-09 DIAGNOSIS — C250 Malignant neoplasm of head of pancreas: Secondary | ICD-10-CM

## 2014-08-09 MED ORDER — CAPECITABINE 500 MG PO TABS: ORAL_TABLET | ORAL | Status: DC

## 2014-08-09 NOTE — Progress Notes (Signed)
Complex simulation/treatment planning note: The patient was taken to the CT simulator. She was placed supine with construction of custom VAC LOC immobilization. She was given oral contrast to outline her stomach and duodenum. She was then scanned. The CT data set was sent to the planning system (MIM) I contoured her GTV and expanded this by 1.0 cm to create her CTV and then modified his CTV 2 include suspicious lymph nodes inferiorly. The combined CTV (CTV 1) was then expanded by 0.5 cm to create PTV 1 which will receive 5040 cGy in 28 sessions. I also contoured her avoidance structures including the duodenum, jejunum, and stomach. Dosimetry will contour her kidneys, spinal cord, and liver. She is now ready for 3-D simulation. My plan is to treat her with Tomotherapy 3-D.

## 2014-08-10 ENCOUNTER — Encounter: Payer: Self-pay | Admitting: *Deleted

## 2014-08-10 NOTE — Progress Notes (Signed)
[place] Work  Clinical Social Work was referred by patient navigator for information regarding disability and assessment of psychosocial needs.  Clinical Social Worker contacted patient at home to offer support and assess for needs.  Patient informed CSW that she works for [employer] and is aware of the application process.  Patient informed CSW that she does not meet the requirements for SSDI because she is still working, and plans to continue working.  CSW offered support and encouraged patient to call when she feels it is the appropriate time to apply for SSD.  Patient did not express any additional concerns at this time.  CSW encouraged pt to call with questions or concerns.          Johnnye Lana, MSW, LCSW, OSW-C Clinical Social Worker [facility] [phone]

## 2014-08-11 ENCOUNTER — Encounter: Payer: Self-pay | Admitting: Radiation Oncology

## 2014-08-11 DIAGNOSIS — Z510 Encounter for antineoplastic radiation therapy: Secondary | ICD-10-CM | POA: Diagnosis not present

## 2014-08-11 NOTE — Progress Notes (Signed)
3-D simulation note: The patient completed her 3-D simulation today in the management of her carcinoma of the pancreas. She was planned with helical 3-D Tomotherapy. The number of complex treatment devices will be determined when she undergoes Tomotherapy segmentation. Dose volume histograms were obtained for the target structures, and also the stomach, duodenum/jejunum, it right and left kidneys, liver, and spinal cord. We met our departmental guidelines. I decided prescribed 5040 cGy, but I was concerned about the dose to her duodenum. I therefore changed her dose to 4500 cGy in 25 sessions. She is to 6 MV photons.

## 2014-08-12 ENCOUNTER — Encounter: Payer: Self-pay | Admitting: Radiation Oncology

## 2014-08-12 ENCOUNTER — Telehealth: Payer: Self-pay | Admitting: *Deleted

## 2014-08-12 DIAGNOSIS — Z510 Encounter for antineoplastic radiation therapy: Secondary | ICD-10-CM | POA: Diagnosis not present

## 2014-08-12 MED ORDER — ONDANSETRON HCL 8 MG PO TABS: 8.0000 mg | ORAL_TABLET | Freq: Three times a day (TID) | ORAL | Status: DC | PRN

## 2014-08-12 NOTE — Progress Notes (Signed)
The patient underwent Tomotherapy today with 6.3 plan delivered field widths for a total of 6 complex treatment devices.

## 2014-08-12 NOTE — Telephone Encounter (Signed)
Call from pt requesting a different antiemetic. Missed work today due to nausea. Denies constipation or diarrhea. Reviewed with Lattie Haw, order received for Zofran. Pt is tolerating fluids. Reports "PMS symptoms" related to being due for IUD exchange. Thinks this is exacerbating nausea.

## 2014-08-17 ENCOUNTER — Encounter: Payer: Self-pay | Admitting: Radiation Oncology

## 2014-08-17 NOTE — Progress Notes (Signed)
CC: Dr. Julieanne Manson   Chart note: Frances [name] called [place] today to tell us that she cannot start her ration therapy until this Thursday (she did not give a reason). She was scheduled to begin her radiation therapy today along with Xeloda. She will contact Dr. Gearldine Shown office to see if she should start her Xeloda today or wait until she starts her radiation therapy this Thursday.

## 2014-08-19 ENCOUNTER — Ambulatory Visit
Admission: RE | Admit: 2014-08-19 | Discharge: 2014-08-19 | Disposition: A | Discharge status: Home / Self Care | Payer: 59 | Source: Ambulatory Visit | Attending: Radiation Oncology | Admitting: Radiation Oncology

## 2014-08-19 ENCOUNTER — Encounter: Payer: Self-pay | Admitting: Radiation Oncology

## 2014-08-19 DIAGNOSIS — C250 Malignant neoplasm of head of pancreas: Secondary | ICD-10-CM

## 2014-08-19 DIAGNOSIS — Z510 Encounter for antineoplastic radiation therapy: Secondary | ICD-10-CM | POA: Diagnosis not present

## 2014-08-19 NOTE — Progress Notes (Signed)
Patient education completed with patient and husband. Gave pt "Radiaiton and You" booklet with all pertinent information marked and discussed, re: diarrhea/management, fatigue, n/v/management, nutrition, pain. Pt and husband verbalized understanding.

## 2014-08-19 NOTE — Progress Notes (Signed)
Chart note: The patient underwent Tomotherapy segmentation for her first 3-D treatment to the abdomen and the management of her carcinoma of the pancreas. She is being treated to 6.3 delivered field widths corresponding to 6 complex treatment devices.

## 2014-08-20 ENCOUNTER — Telehealth: Payer: Self-pay | Admitting: *Deleted

## 2014-08-20 ENCOUNTER — Ambulatory Visit
Admission: RE | Admit: 2014-08-20 | Discharge: 2014-08-20 | Disposition: A | Discharge status: Home / Self Care | Payer: 59 | Source: Ambulatory Visit | Attending: Radiation Oncology | Admitting: Radiation Oncology

## 2014-08-20 DIAGNOSIS — Z510 Encounter for antineoplastic radiation therapy: Secondary | ICD-10-CM | POA: Diagnosis not present

## 2014-08-20 NOTE — Telephone Encounter (Signed)
Call from pt reporting constipation. Missed a dose of Miralax on Wed. Nausea is improved. Asks if she should continue Miralax? Has not been taking narcotic analgesic. Instructed her to continue Miralax. Antiemetics and dietary changes may be contributing to nausea. She voiced understanding.

## 2014-08-23 ENCOUNTER — Ambulatory Visit
Admission: RE | Admit: 2014-08-23 | Discharge: 2014-08-23 | Disposition: A | Discharge status: Home / Self Care | Payer: 59 | Source: Ambulatory Visit | Attending: Radiation Oncology | Admitting: Radiation Oncology

## 2014-08-23 ENCOUNTER — Encounter: Payer: Self-pay | Admitting: Radiation Oncology

## 2014-08-23 VITALS — BP 144/99 | HR 97 | Temp 98.5°F | Resp 16 | Ht 65.0 in | Wt 204.6 lb

## 2014-08-23 DIAGNOSIS — Z510 Encounter for antineoplastic radiation therapy: Secondary | ICD-10-CM | POA: Diagnosis not present

## 2014-08-23 DIAGNOSIS — C250 Malignant neoplasm of head of pancreas: Secondary | ICD-10-CM

## 2014-08-23 NOTE — Progress Notes (Signed)
Frances [name] has completed 2 fractions to her pancreas.  She denies pain and nausea today.  She did have nausea last week.  Her weight is down 7 lbs from 08/05/14.  She reports that her appetite is improving.  She is taking xeloda twice a day.  She is working full time.  Her bp was elevated today at 144/99.

## 2014-08-23 NOTE — Progress Notes (Signed)
Weekly Management Note:  Site: Pancreas Current Dose:  540  cGy Projected Dose: 4500  cGy  Narrative: The patient is seen today for routine under treatment assessment. CBCT/MVCT images/port films were reviewed. The chart was reviewed.   She has some constipation over the weekend after holding MiraLax. She is not having any abdominal pain or nausea today. She is on Xeloda Monday through Friday.  Physical Examination:  Filed Vitals:   08/23/14 1714  BP: 144/99  Pulse: 97  Temp: 98.5 F (36.9 C)  Resp: 16  .  Weight: 204 lb 9.6 oz (92.806 kg). No change.  Laboratory data: Lab Results  Component Value Date   WBC 4.3 08/05/2014   HGB 11.3* 08/05/2014   HCT 36.5 08/05/2014   MCV 83.6 08/05/2014   PLT 190 08/05/2014     Impression: Tolerating radiation therapy well.  Plan: Continue radiation therapy as planned.

## 2014-08-24 ENCOUNTER — Ambulatory Visit
Admission: RE | Admit: 2014-08-24 | Discharge: 2014-08-24 | Disposition: A | Discharge status: Home / Self Care | Payer: 59 | Source: Ambulatory Visit | Attending: Radiation Oncology | Admitting: Radiation Oncology

## 2014-08-24 DIAGNOSIS — Z510 Encounter for antineoplastic radiation therapy: Secondary | ICD-10-CM | POA: Diagnosis not present

## 2014-08-25 ENCOUNTER — Ambulatory Visit
Admission: RE | Admit: 2014-08-25 | Discharge: 2014-08-25 | Disposition: A | Discharge status: Home / Self Care | Payer: 59 | Source: Ambulatory Visit | Attending: Radiation Oncology | Admitting: Radiation Oncology

## 2014-08-25 DIAGNOSIS — Z510 Encounter for antineoplastic radiation therapy: Secondary | ICD-10-CM | POA: Diagnosis not present

## 2014-08-26 ENCOUNTER — Encounter: Payer: Self-pay | Admitting: Oncology

## 2014-08-26 ENCOUNTER — Ambulatory Visit
Admission: RE | Admit: 2014-08-26 | Discharge: 2014-08-26 | Disposition: A | Discharge status: Home / Self Care | Payer: 59 | Source: Ambulatory Visit | Attending: Radiation Oncology | Admitting: Radiation Oncology

## 2014-08-26 DIAGNOSIS — Z510 Encounter for antineoplastic radiation therapy: Secondary | ICD-10-CM | POA: Diagnosis not present

## 2014-08-26 NOTE — Progress Notes (Signed)
Put husband's fmla form on nurse's desk. °

## 2014-08-27 ENCOUNTER — Ambulatory Visit: Payer: 59

## 2014-08-27 ENCOUNTER — Encounter: Payer: Self-pay | Admitting: Oncology

## 2014-08-27 NOTE — Progress Notes (Signed)
Faxed husband's fmla form to [employer] @ [phone]

## 2014-08-30 ENCOUNTER — Other Ambulatory Visit: Payer: Self-pay | Admitting: *Deleted

## 2014-08-30 ENCOUNTER — Encounter: Payer: Self-pay | Admitting: Radiation Oncology

## 2014-08-30 ENCOUNTER — Ambulatory Visit: Payer: 59 | Admitting: Radiation Oncology

## 2014-08-30 ENCOUNTER — Ambulatory Visit
Admission: RE | Admit: 2014-08-30 | Discharge: 2014-08-30 | Disposition: A | Discharge status: Home / Self Care | Payer: 59 | Source: Ambulatory Visit | Attending: Radiation Oncology | Admitting: Radiation Oncology

## 2014-08-30 ENCOUNTER — Ambulatory Visit: Payer: 59

## 2014-08-30 ENCOUNTER — Ambulatory Visit (HOSPITAL_BASED_OUTPATIENT_CLINIC_OR_DEPARTMENT_OTHER): Payer: 59 | Admitting: Oncology

## 2014-08-30 ENCOUNTER — Other Ambulatory Visit (HOSPITAL_BASED_OUTPATIENT_CLINIC_OR_DEPARTMENT_OTHER): Payer: 59

## 2014-08-30 ENCOUNTER — Telehealth: Payer: Self-pay | Admitting: Oncology

## 2014-08-30 VITALS — BP 150/98 | HR 92 | Temp 98.9°F | Resp 20 | Ht 65.0 in | Wt 203.5 lb

## 2014-08-30 VITALS — BP 147/96 | HR 92 | Temp 98.4°F | Ht 65.0 in | Wt 204.6 lb

## 2014-08-30 DIAGNOSIS — C259 Malignant neoplasm of pancreas, unspecified: Secondary | ICD-10-CM

## 2014-08-30 DIAGNOSIS — C250 Malignant neoplasm of head of pancreas: Secondary | ICD-10-CM

## 2014-08-30 DIAGNOSIS — I10 Essential (primary) hypertension: Secondary | ICD-10-CM

## 2014-08-30 DIAGNOSIS — R110 Nausea: Secondary | ICD-10-CM

## 2014-08-30 DIAGNOSIS — Z510 Encounter for antineoplastic radiation therapy: Secondary | ICD-10-CM | POA: Diagnosis not present

## 2014-08-30 DIAGNOSIS — R109 Unspecified abdominal pain: Secondary | ICD-10-CM

## 2014-08-30 DIAGNOSIS — M549 Dorsalgia, unspecified: Secondary | ICD-10-CM

## 2014-08-30 DIAGNOSIS — G622 Polyneuropathy due to other toxic agents: Secondary | ICD-10-CM

## 2014-08-30 DIAGNOSIS — T451X5A Adverse effect of antineoplastic and immunosuppressive drugs, initial encounter: Secondary | ICD-10-CM

## 2014-08-30 LAB — CBC WITH DIFFERENTIAL/PLATELET
BASO%: 0.2 % (ref 0.0–2.0)
Basophils Absolute: 0 10*3/uL (ref 0.0–0.1)
EOS ABS: 0 10*3/uL (ref 0.0–0.5)
EOS%: 0.2 % (ref 0.0–7.0)
HCT: 36.2 % (ref 34.8–46.6)
HGB: 11.2 g/dL — ABNORMAL LOW (ref 11.6–15.9)
LYMPH%: 4.3 % — ABNORMAL LOW (ref 14.0–49.7)
MCH: 25.6 pg (ref 25.1–34.0)
MCHC: 31.1 g/dL — ABNORMAL LOW (ref 31.5–36.0)
MCV: 82.2 fL (ref 79.5–101.0)
MONO#: 0.4 10*3/uL (ref 0.1–0.9)
MONO%: 6.2 % (ref 0.0–14.0)
NEUT#: 6.3 10*3/uL (ref 1.5–6.5)
NEUT%: 89.1 % — ABNORMAL HIGH (ref 38.4–76.8)
Platelets: 169 10*3/uL (ref 145–400)
RBC: 4.4 10*6/uL (ref 3.70–5.45)
RDW: 15 % — AB (ref 11.2–14.5)
WBC: 7 10*3/uL (ref 3.9–10.3)
lymph#: 0.3 10*3/uL — ABNORMAL LOW (ref 0.9–3.3)

## 2014-08-30 MED ORDER — HYDROCHLOROTHIAZIDE 25 MG PO TABS: 25.0000 mg | ORAL_TABLET | Freq: Every evening | ORAL | Status: DC

## 2014-08-30 NOTE — Progress Notes (Signed)
Frances [name] has completed 7/25 fractions to her pancreas. She reports pain due to pelvic cramping at a 5/10.  She had her IUD taken out on Thursday and they were not able to replace it.  She reports occasional nausea.  She reports burping a lot.  She is taking xeloda.  She denies diarrhea but says her stools are softer.  She denies an increase in fatigue.

## 2014-08-30 NOTE — Progress Notes (Signed)
Weekly Management Note:  Site: Pancreas Current Dose:  1260  cGy Projected Dose: 4500 cGy  Narrative: The patient is seen today for routine under treatment assessment. CBCT/MVCT images/port films were reviewed. The chart was reviewed.   Her major complaint is that of pelvic cramping secondary to menstruation. Dr. remove her IUD last week. She is having some "popping". She continues with her daily Xeloda. No diarrhea but her stools are softer. Blood counts are satisfactory.  Physical Examination:  Filed Vitals:   08/30/14 1847  BP: 147/96  Pulse: 92  Temp: 98.4 F (36.9 C)  .  Weight: 204 lb 9.6 oz (92.806 kg). No change.  Laboratory data: Lab Results  Component Value Date   WBC 7.0 08/30/2014   HGB 11.2* 08/30/2014   HCT 36.2 08/30/2014   MCV 82.2 08/30/2014   PLT 169 08/30/2014     Impression: Tolerating radiation therapy well.  Plan: Continue radiation therapy as planned.

## 2014-08-30 NOTE — Telephone Encounter (Signed)
Pt confirmed labs/ov per 09/21 POF, flush added for 09/22, gave pt AVS...Marland KitchenMarland KitchenKJ

## 2014-08-30 NOTE — Progress Notes (Signed)
Frances [name]   Diagnosis: Pancreas cancer  INTERVAL HISTORY:   Frances [name] returns as scheduled. She began radiation and concurrent capecitabine on 08/19/2014. She had constipation at the beginning of treatment. She relates this to taking an increased number of pain pills. The stool is now loose. No Frank diarrhea. She had an IUD removed 08/26/2014. She developed a menstrual cycle and menstrual cramps after the IUD was removed. The cramps persist and she began Midol today. No mouth sores or hand/foot pain. She continues to have mild numbness and tingling in the hands and feet. Objective:  Vital signs in last 24 hours:  Blood pressure 150/98, pulse 92, temperature 98.9 F (37.2 C), temperature source Oral, resp. rate 20, height 5\' 5"  (1.651 m), weight 203 lb 8 oz (92.307 kg).    HEENT: No thrush or ulcers Resp: Lungs clear bilaterally Cardio: Regular rate and rhythm GI: No hepatomegaly, no mass, nontender Vascular: No leg edema  Skin: Mild hyperpigmentation of the hands and feet. No erythema or skin breakdown.   Portacath/PICC-without erythema  Lab Results:  Lab Results  Component Value Date   WBC 7.0 08/30/2014   HGB 11.2* 08/30/2014   HCT 36.2 08/30/2014   MCV 82.2 08/30/2014   PLT 169 08/30/2014   NEUTROABS 6.3 08/30/2014      Lab Results  Component Value Date   CEA 22.7* 08/05/2014    Medications: I have reviewed the patient's current medications.  Assessment/Plan: 1. 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 Initiation of FOLFIRINOX 10/29/2013. She did not complete the 5-FU pump due to developing altered speech. Cycle 2 completed 11/12/2013. Cycle 3 completed 11/26/2013. Cycle 4 completed 12/09/2013, cycle 5 12/24/2013, cycle 6 01/14/2014, cycle 7 01/28/2014, cycle 8 02/11/2014, cycle 9 02/25/2014, cycle 10 03/11/2014 Restaging CT 01/06/2012 with a slight increase in the size of the pancreas mass and no clear  evidence of distant metastatic disease.  CEA improved 01/28/2014.  Cycle 1 of modified FOLFIRI 03/25/2014.  Cycle 2 modified FOLFIRI 04/08/2014.  CEA stable 04/26/2014.  Restaging CT abdomen/pelvis 04/26/2014 with interval decrease in the size of the pancreatic head mass. Stable mesenteric and retroperitoneal lymphadenopathy. Stable splenomegaly. 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 Stable small liver lesions consistent with benign cysts.  Continuation of FOLFIRI on a 3 week schedule.  CEA increased (13.8) on 06/03/2014.  CEA increased (23.3) on 07/08/2014.  Restaging CT evaluation 08/03/2014 with mild interval enlargement of the pancreatic head mass. Slight increase in the size of small central mesenteric lymph nodes. 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 Common bile duct stent in place without evidence of biliary duct dilatation. Stable small hypodensities within the liver. Initiation of concurrent capecitabine and radiation 08/19/2014 2. Altered speech prior to completion of cycle 1 of FOLFIRINOX, similar symptoms and tongue/lip twitching following the oxaliplatin with cycle 2 FOLFIRINOX. Oxaliplatin was dose reduced and given over 4 hours beginning with cycle 3. She had similar neurologic symptoms after subsequent cycles of FOLFIRINOX. 3. Status post cholecystectomy 10/02/2013 with pathology revealing chronic cholecystitis. 4. Abdomen/back pain secondary to #1.  5. Hypertension. 6. Status post Port-A-Cath placement 10/28/2013. 7. History of elevated liver enzyme-improved. 8. History of hypokalemia. Taking a potassium supplement. 9. History of neutropenia secondary to chemotherapy. Neulasta was added beginning with cycle 6 of FOLFIRINOX on 01/14/2014. 10. Delayed nausea. Improved with the addition of Aloxi. 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    Disposition:  She appears stable from an  oncology standpoint. The plan is to continue capecitabine and radiation. The low abdominal "cramps" are most likely secondary to her menstrual cycle.  Frances [name] will continue capecitabine and radiation. She will return for an office visit 09/14/2014. The Port-A-Cath will be flushed 08/31/2014.  Betsy Coder, MD  08/30/2014  12:38 PM

## 2014-08-31 ENCOUNTER — Ambulatory Visit (HOSPITAL_BASED_OUTPATIENT_CLINIC_OR_DEPARTMENT_OTHER): Payer: 59

## 2014-08-31 ENCOUNTER — Ambulatory Visit
Admission: RE | Admit: 2014-08-31 | Discharge: 2014-08-31 | Disposition: A | Discharge status: Home / Self Care | Payer: 59 | Source: Ambulatory Visit | Attending: Radiation Oncology | Admitting: Radiation Oncology

## 2014-08-31 VITALS — BP 150/90 | HR 97 | Temp 99.1°F

## 2014-08-31 DIAGNOSIS — Z510 Encounter for antineoplastic radiation therapy: Secondary | ICD-10-CM | POA: Diagnosis not present

## 2014-08-31 DIAGNOSIS — C250 Malignant neoplasm of head of pancreas: Secondary | ICD-10-CM

## 2014-08-31 DIAGNOSIS — Z95828 Presence of other vascular implants and grafts: Secondary | ICD-10-CM

## 2014-08-31 DIAGNOSIS — Z452 Encounter for adjustment and management of vascular access device: Secondary | ICD-10-CM

## 2014-08-31 MED ORDER — SODIUM CHLORIDE 0.9 % IJ SOLN
10.0000 mL | INTRAMUSCULAR | Status: DC | PRN
  Administered 2014-08-31: 10 mL via INTRAVENOUS
  Filled 2014-08-31: qty 10

## 2014-08-31 MED ORDER — HEPARIN SOD (PORK) LOCK FLUSH 100 UNIT/ML IV SOLN
500.0000 [IU] | Freq: Once | INTRAVENOUS | Status: AC
  Administered 2014-08-31: 500 [IU] via INTRAVENOUS
  Filled 2014-08-31: qty 5

## 2014-08-31 NOTE — Patient Instructions (Signed)

## 2014-09-01 ENCOUNTER — Ambulatory Visit
Admission: RE | Admit: 2014-09-01 | Discharge: 2014-09-01 | Disposition: A | Discharge status: Home / Self Care | Payer: 59 | Source: Ambulatory Visit | Attending: Radiation Oncology | Admitting: Radiation Oncology

## 2014-09-01 DIAGNOSIS — Z510 Encounter for antineoplastic radiation therapy: Secondary | ICD-10-CM | POA: Diagnosis not present

## 2014-09-02 ENCOUNTER — Ambulatory Visit
Admission: RE | Admit: 2014-09-02 | Discharge: 2014-09-02 | Disposition: A | Discharge status: Home / Self Care | Payer: 59 | Source: Ambulatory Visit | Attending: Radiation Oncology | Admitting: Radiation Oncology

## 2014-09-02 DIAGNOSIS — Z510 Encounter for antineoplastic radiation therapy: Secondary | ICD-10-CM | POA: Diagnosis not present

## 2014-09-03 ENCOUNTER — Ambulatory Visit
Admission: RE | Admit: 2014-09-03 | Discharge: 2014-09-03 | Disposition: A | Discharge status: Home / Self Care | Payer: 59 | Source: Ambulatory Visit | Attending: Radiation Oncology | Admitting: Radiation Oncology

## 2014-09-03 DIAGNOSIS — Z510 Encounter for antineoplastic radiation therapy: Secondary | ICD-10-CM | POA: Diagnosis not present

## 2014-09-03 NOTE — Progress Notes (Signed)
Patient stopped by nursing, c/o nausea, gas bloating,  and constipation, taking miralax bid, eating popcorn, suggested she stop the popcorn, and may need to switch to a stool softner instead of miralax, try warm prune juice with teaspoon melted butter to try, ginger ale, brat diet suggested, ginger root , to make hot ginger tea, patient will try these and see Dr.Murray Monday after treatment, No fever stated, 4:29 PM

## 2014-09-06 ENCOUNTER — Ambulatory Visit
Admission: RE | Admit: 2014-09-06 | Discharge: 2014-09-06 | Disposition: A | Discharge status: Home / Self Care | Payer: 59 | Source: Ambulatory Visit | Attending: Radiation Oncology | Admitting: Radiation Oncology

## 2014-09-06 ENCOUNTER — Encounter: Payer: Self-pay | Admitting: Radiation Oncology

## 2014-09-06 VITALS — BP 133/83 | HR 108 | Temp 98.2°F | Resp 20 | Wt 197.4 lb

## 2014-09-06 DIAGNOSIS — Z510 Encounter for antineoplastic radiation therapy: Secondary | ICD-10-CM | POA: Diagnosis not present

## 2014-09-06 DIAGNOSIS — C250 Malignant neoplasm of head of pancreas: Secondary | ICD-10-CM

## 2014-09-06 NOTE — Progress Notes (Signed)
Weekly Management Note:  Site: Pancreas Current Dose:  2160  cGy Projected Dose: 4500  cGy  Narrative: The patient is seen today for routine under treatment assessment. CBCT/MVCT images/port films were reviewed. The chart was reviewed.   She has been having bloating and nausea on offer the past week. She was constipated over the weekend but is now doing better on stool softeners and drinking prune juice. She is not having diarrhea or vomiting. She is on low residue diet. Her pretreatment back pain is improved but she still having what she thinks is gas pain which is migratory. She only takes Tylenol when necessary for abdominal discomfort. Blood counts are satisfactory.  Physical Examination:  Filed Vitals:   09/06/14 1322  BP: 133/83  Pulse: 108  Temp:   Resp:   .  Weight: 197 lb 6.4 oz (89.54 kg). Her abdomen is soft and nontender.  Laboratory data:  Lab Results  Component Value Date   WBC 7.0 08/30/2014   HGB 11.2* 08/30/2014   HCT 36.2 08/30/2014   MCV 82.2 08/30/2014   PLT 169 08/30/2014     Impression: Tolerating radiation therapy well, except for bloating and nausea. She may take simethicone when necessary. She'll see Dr. Benay Spice again for a followup visit on October 6.  Plan: Continue radiation therapy as planned.

## 2014-09-06 NOTE — Progress Notes (Signed)
Patient reports that over the weekend she has had bloating, gas pains and lower abdominal pains. She denies nausea/vomiting but states "my stomach just doesn't feel good". She has not taken any antiemetics. She states her BM's are soft to liquid more than once a day. She was constipated, so she has been taking Miralax, drinking prune juice. Advised she may need to stop prune juice and try to get BMs of normal consistency. She has loss of appetite but states she is drinking water. Orthostatic VS taken, no sign of dehydration per BP readings. She takes Tylenol prn for abdominal pains with good relief.

## 2014-09-07 ENCOUNTER — Ambulatory Visit
Admission: RE | Admit: 2014-09-07 | Discharge: 2014-09-07 | Disposition: A | Discharge status: Home / Self Care | Payer: 59 | Source: Ambulatory Visit | Attending: Radiation Oncology | Admitting: Radiation Oncology

## 2014-09-07 DIAGNOSIS — Z510 Encounter for antineoplastic radiation therapy: Secondary | ICD-10-CM | POA: Diagnosis not present

## 2014-09-08 ENCOUNTER — Ambulatory Visit
Admission: RE | Admit: 2014-09-08 | Discharge: 2014-09-08 | Disposition: A | Discharge status: Home / Self Care | Payer: 59 | Source: Ambulatory Visit | Attending: Radiation Oncology | Admitting: Radiation Oncology

## 2014-09-08 DIAGNOSIS — Z510 Encounter for antineoplastic radiation therapy: Secondary | ICD-10-CM | POA: Diagnosis not present

## 2014-09-09 ENCOUNTER — Ambulatory Visit
Admission: RE | Admit: 2014-09-09 | Discharge: 2014-09-09 | Disposition: A | Discharge status: Home / Self Care | Payer: 59 | Source: Ambulatory Visit | Attending: Radiation Oncology | Admitting: Radiation Oncology

## 2014-09-09 DIAGNOSIS — I10 Essential (primary) hypertension: Secondary | ICD-10-CM | POA: Insufficient documentation

## 2014-09-09 DIAGNOSIS — Z510 Encounter for antineoplastic radiation therapy: Secondary | ICD-10-CM | POA: Insufficient documentation

## 2014-09-09 DIAGNOSIS — C250 Malignant neoplasm of head of pancreas: Secondary | ICD-10-CM | POA: Insufficient documentation

## 2014-09-10 ENCOUNTER — Ambulatory Visit
Admission: RE | Admit: 2014-09-10 | Discharge: 2014-09-10 | Disposition: A | Discharge status: Home / Self Care | Payer: 59 | Source: Ambulatory Visit | Attending: Radiation Oncology | Admitting: Radiation Oncology

## 2014-09-10 DIAGNOSIS — Z510 Encounter for antineoplastic radiation therapy: Secondary | ICD-10-CM | POA: Diagnosis not present

## 2014-09-13 ENCOUNTER — Encounter: Payer: Self-pay | Admitting: Radiation Oncology

## 2014-09-13 ENCOUNTER — Ambulatory Visit
Admission: RE | Admit: 2014-09-13 | Discharge: 2014-09-13 | Disposition: A | Discharge status: Home / Self Care | Payer: 59 | Source: Ambulatory Visit | Attending: Radiation Oncology | Admitting: Radiation Oncology

## 2014-09-13 VITALS — BP 145/94 | HR 89 | Temp 98.0°F | Resp 20 | Wt 195.4 lb

## 2014-09-13 DIAGNOSIS — Z510 Encounter for antineoplastic radiation therapy: Secondary | ICD-10-CM | POA: Diagnosis not present

## 2014-09-13 DIAGNOSIS — C250 Malignant neoplasm of head of pancreas: Secondary | ICD-10-CM

## 2014-09-13 NOTE — Progress Notes (Signed)
Weekly Management Note:  Site: Pancreas Current Dose:  3060  cGy Projected Dose: 4500  cGy  Narrative: The patient is seen today for routine under treatment assessment. CBCT/MVCT images/port films were reviewed. The chart was reviewed.   She still doing well except for gas pains this afternoon after eating spaghetti. She forgot to take her omeprazole earlier today. She continues with her Xeloda during the week. Her diarrhea is improved. She has stopped juice and MiraLax. She will see Dr. Benay Spice tomorrow and have a CBC as well. Her back pain remains improved.  Physical Examination:  Filed Vitals:   09/13/14 1617  BP: 145/94  Pulse: 89  Temp: 98 F (36.7 C)  Resp: 20  .  Weight: 195 lb 6.4 oz (88.633 kg). Abdomen is soft and nontender.  Laboratory data: Lab Results  Component Value Date   WBC 7.0 08/30/2014   HGB 11.2* 08/30/2014   HCT 36.2 08/30/2014   MCV 82.2 08/30/2014   PLT 169 08/30/2014   Impression: Tolerating radiation therapy well.  Plan: Continue radiation therapy as planned.

## 2014-09-13 NOTE — Progress Notes (Signed)
Weekly rad txs pancreas 17 completd, c/o gas, heart burn after eating spaghetti , forgot to take her omperazole,  Had 5 bm's today from solid to looseto runny, taking xeloda bid, hasn't taken imodium ad, on low residue diet for most part, ,stopped prune juice at present,no miralax since last Friday, feels =gas moving in stomach to groin area 4:21 PM

## 2014-09-14 ENCOUNTER — Other Ambulatory Visit (HOSPITAL_BASED_OUTPATIENT_CLINIC_OR_DEPARTMENT_OTHER): Payer: 59

## 2014-09-14 ENCOUNTER — Ambulatory Visit
Admission: RE | Admit: 2014-09-14 | Discharge: 2014-09-14 | Disposition: A | Discharge status: Home / Self Care | Payer: 59 | Source: Ambulatory Visit | Attending: Radiation Oncology | Admitting: Radiation Oncology

## 2014-09-14 ENCOUNTER — Ambulatory Visit (HOSPITAL_BASED_OUTPATIENT_CLINIC_OR_DEPARTMENT_OTHER): Payer: 59 | Admitting: Nurse Practitioner

## 2014-09-14 VITALS — BP 144/92 | HR 93 | Temp 98.4°F | Resp 18 | Ht 65.0 in | Wt 194.3 lb

## 2014-09-14 DIAGNOSIS — C250 Malignant neoplasm of head of pancreas: Secondary | ICD-10-CM

## 2014-09-14 DIAGNOSIS — G620 Drug-induced polyneuropathy: Secondary | ICD-10-CM

## 2014-09-14 DIAGNOSIS — Z510 Encounter for antineoplastic radiation therapy: Secondary | ICD-10-CM | POA: Diagnosis not present

## 2014-09-14 DIAGNOSIS — I10 Essential (primary) hypertension: Secondary | ICD-10-CM

## 2014-09-14 DIAGNOSIS — D649 Anemia, unspecified: Secondary | ICD-10-CM

## 2014-09-14 LAB — CBC WITH DIFFERENTIAL/PLATELET
BASO%: 0 % (ref 0.0–2.0)
Basophils Absolute: 0 10*3/uL (ref 0.0–0.1)
EOS ABS: 0.1 10*3/uL (ref 0.0–0.5)
EOS%: 2.9 % (ref 0.0–7.0)
HCT: 31.2 % — ABNORMAL LOW (ref 34.8–46.6)
HGB: 9.8 g/dL — ABNORMAL LOW (ref 11.6–15.9)
LYMPH%: 10.2 % — ABNORMAL LOW (ref 14.0–49.7)
MCH: 26 pg (ref 25.1–34.0)
MCHC: 31.4 g/dL — ABNORMAL LOW (ref 31.5–36.0)
MCV: 82.8 fL (ref 79.5–101.0)
MONO#: 0.3 10*3/uL (ref 0.1–0.9)
MONO%: 13.1 % (ref 0.0–14.0)
NEUT%: 73.8 % (ref 38.4–76.8)
NEUTROS ABS: 1.8 10*3/uL (ref 1.5–6.5)
Platelets: 270 10*3/uL (ref 145–400)
RBC: 3.77 10*6/uL (ref 3.70–5.45)
RDW: 14.5 % (ref 11.2–14.5)
WBC: 2.4 10*3/uL — ABNORMAL LOW (ref 3.9–10.3)
lymph#: 0.3 10*3/uL — ABNORMAL LOW (ref 0.9–3.3)

## 2014-09-14 NOTE — Progress Notes (Signed)
[place] OFFICE PROGRESS NOTE   Diagnosis:  Pancreas cancer  INTERVAL HISTORY:   [name] returns as scheduled. She continues radiation and concurrent Xeloda. She has low-grade nausea which she attributes to radiation. No vomiting. Bowels alternate constipation and diarrhea. No mouth sores. No hand or foot redness. She noted some darkening of the palms. Abdominal/back pain is better. She is no longer taking pain medication. She notes improvement in the numbness over the fingers. She has stable numbness in the toes. She notes that the numbness increases with cold exposure.  Objective:  Vital signs in last 24 hours:  Blood pressure 144/92, pulse 93, temperature 98.4 F (36.9 C), temperature source Oral, resp. rate 18, height 5\' 5"  (1.651 m), weight 194 lb 4.8 oz (88.134 kg), last menstrual period 08/26/2014.    HEENT: No thrush or ulcers. Resp: Lungs clear bilaterally. Cardio: Regular rate and rhythm. GI: Abdomen soft and nontender. No hepatomegaly. No mass. Vascular: No leg edema. Superficial varicosities right lower leg. Neuro: Vibratory sense mildly decreased over the fingertips per tuning fork exam.  Skin: Palms without erythema. No skin breakdown.  Port-A-Cath without erythema.    Lab Results:  Lab Results  Component Value Date   WBC 2.4* 09/14/2014   HGB 9.8* 09/14/2014   HCT 31.2* 09/14/2014   MCV 82.8 09/14/2014   PLT 270 09/14/2014   NEUTROABS 1.8 09/14/2014    Imaging:  No results found.  Medications: I have reviewed the patient's current medications.  Assessment/Plan: 1. 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 Initiation of FOLFIRINOX 10/29/2013. She did not complete the 5-FU pump due to developing altered speech. Cycle 2 completed 11/12/2013. Cycle 3 completed 11/26/2013. Cycle 4 completed 12/09/2013, cycle 5 12/24/2013, cycle 6 01/14/2014, cycle 7 01/28/2014, cycle 8 02/11/2014, cycle 9 02/25/2014, cycle 10  03/11/2014 Restaging CT 01/06/2012 with a slight increase in the size of the pancreas mass and no clear evidence of distant metastatic disease.  CEA improved 01/28/2014.  Cycle 1 of modified FOLFIRI 03/25/2014.  Cycle 2 modified FOLFIRI 04/08/2014.  CEA stable 04/26/2014.  Restaging CT abdomen/pelvis 04/26/2014 with interval decrease in the size of the pancreatic head mass. Stable mesenteric and retroperitoneal lymphadenopathy. Stable splenomegaly. 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 Stable small liver lesions consistent with benign cysts.  Continuation of FOLFIRI on a 3 week schedule.  CEA increased (13.8) on 06/03/2014.  CEA increased (23.3) on 07/08/2014.  Restaging CT evaluation 08/03/2014 with mild interval enlargement of the pancreatic head mass. Slight increase in the size of small central mesenteric lymph nodes. 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 Common bile duct stent in place without evidence of biliary duct dilatation. Stable small hypodensities within the liver.  Initiation of concurrent capecitabine and radiation 08/19/2014 2. Altered speech prior to completion of cycle 1 of FOLFIRINOX, similar symptoms and tongue/lip twitching following the oxaliplatin with cycle 2 FOLFIRINOX. Oxaliplatin was dose reduced and given over 4 hours beginning with cycle 3. She had similar neurologic symptoms after subsequent cycles of FOLFIRINOX. 3. Status post cholecystectomy 10/02/2013 with pathology revealing chronic cholecystitis. 4. Abdomen/back pain secondary to #1.  5. Hypertension. 6. Status post Port-A-Cath placement 10/28/2013. 7. History of elevated liver enzyme-improved. 8. History of hypokalemia. Taking a potassium supplement. 9. History of neutropenia secondary to chemotherapy. Neulasta was added beginning with cycle 6 of FOLFIRINOX on 01/14/2014. 10. Delayed nausea. Improved with  the addition of Aloxi. 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    Disposition: She continues Xeloda and radiation. Her pain is better. She is scheduled to complete the course of radiation on 09/23/2014. She understands to discontinue Xeloda coinciding with the completion of radiation.   She has progressive anemia. This is likely due to a combination of radiation and chemotherapy. We will obtain a followup CBC on 09/23/2014.   She will return for a followup visit, Port-A-Cath flush and CEA in approximately one month.  Plan reviewed with Dr. Benay Spice.  Ned Card ANP/GNP-BC   09/14/2014  3:28 PM

## 2014-09-15 ENCOUNTER — Ambulatory Visit
Admission: RE | Admit: 2014-09-15 | Discharge: 2014-09-15 | Disposition: A | Discharge status: Home / Self Care | Payer: 59 | Source: Ambulatory Visit | Attending: Radiation Oncology | Admitting: Radiation Oncology

## 2014-09-15 DIAGNOSIS — Z510 Encounter for antineoplastic radiation therapy: Secondary | ICD-10-CM | POA: Diagnosis not present

## 2014-09-16 ENCOUNTER — Ambulatory Visit
Admission: RE | Admit: 2014-09-16 | Discharge: 2014-09-16 | Disposition: A | Discharge status: Home / Self Care | Payer: 59 | Source: Ambulatory Visit | Attending: Radiation Oncology | Admitting: Radiation Oncology

## 2014-09-16 DIAGNOSIS — Z510 Encounter for antineoplastic radiation therapy: Secondary | ICD-10-CM | POA: Diagnosis not present

## 2014-09-17 ENCOUNTER — Ambulatory Visit
Admission: RE | Admit: 2014-09-17 | Discharge: 2014-09-17 | Disposition: A | Discharge status: Home / Self Care | Payer: 59 | Source: Ambulatory Visit | Attending: Radiation Oncology | Admitting: Radiation Oncology

## 2014-09-17 DIAGNOSIS — Z510 Encounter for antineoplastic radiation therapy: Secondary | ICD-10-CM | POA: Diagnosis not present

## 2014-09-20 ENCOUNTER — Ambulatory Visit: Payer: 59

## 2014-09-20 ENCOUNTER — Ambulatory Visit: Payer: 59 | Admitting: Radiation Oncology

## 2014-09-20 ENCOUNTER — Encounter: Payer: Self-pay | Admitting: Radiation Oncology

## 2014-09-21 ENCOUNTER — Ambulatory Visit
Admission: RE | Admit: 2014-09-21 | Discharge: 2014-09-21 | Disposition: A | Discharge status: Home / Self Care | Payer: 59 | Source: Ambulatory Visit | Attending: Radiation Oncology | Admitting: Radiation Oncology

## 2014-09-21 ENCOUNTER — Encounter: Payer: Self-pay | Admitting: Radiation Oncology

## 2014-09-21 ENCOUNTER — Ambulatory Visit: Payer: 59

## 2014-09-21 VITALS — BP 136/86 | HR 95 | Resp 16 | Wt 191.8 lb

## 2014-09-21 DIAGNOSIS — Z510 Encounter for antineoplastic radiation therapy: Secondary | ICD-10-CM | POA: Diagnosis not present

## 2014-09-21 DIAGNOSIS — C250 Malignant neoplasm of head of pancreas: Secondary | ICD-10-CM

## 2014-09-21 NOTE — Progress Notes (Signed)
Patient reports she is managing her symptoms well. Weight and vitals stable. Reports persistent nausea. Discuss taking zofran thirty minutes prior to xeloda. Reports abdominal cramp and gas follow most meal but, she is able to "work through it." Reports loose stools. Denies diarrhea or constipation. Reports she is sleeping well. Reports a diminished appetite and that she fills up quickly.

## 2014-09-21 NOTE — Progress Notes (Signed)
Weekly Management Note:  Site: Pancreas Current Dose:  3960  cGy Projected Dose: 4500  cGy  Narrative: The patient is seen today for routine under treatment assessment. CBCT/MVCT images/port films were reviewed. The chart was reviewed.   She still having difficulty with nausea despite taking Zofran. She does have occasional abdominal cramping but she is able to tolerate the symptoms. Her bowels are loose but she denies diarrhea or constipation. No significant back pain. She seen today prior to her treatment with a delay in the schedule. She was recently seen by Dr. Benay [name]. She'll discontinue her Xeloda when she finishes this Friday.  Physical Examination:  Filed Vitals:   09/21/14 1606  BP: 136/86  Pulse: 95  Resp: 16  .  Weight: 191 lb 12.8 oz (87 kg). Abdomen is soft.  Laboratory data: Lab Results  Component Value Date   WBC 2.4* 09/14/2014   HGB 9.8* 09/14/2014   HCT 31.2* 09/14/2014   MCV 82.8 09/14/2014   PLT 270 09/14/2014     Impression: Tolerating radiation therapy well.  Plan: Continue radiation therapy as planned.

## 2014-09-22 ENCOUNTER — Ambulatory Visit: Payer: 59

## 2014-09-22 ENCOUNTER — Ambulatory Visit
Admission: RE | Admit: 2014-09-22 | Discharge: 2014-09-22 | Disposition: A | Discharge status: Home / Self Care | Payer: 59 | Source: Ambulatory Visit | Attending: Radiation Oncology | Admitting: Radiation Oncology

## 2014-09-22 DIAGNOSIS — Z510 Encounter for antineoplastic radiation therapy: Secondary | ICD-10-CM | POA: Diagnosis not present

## 2014-09-23 ENCOUNTER — Ambulatory Visit: Payer: 59

## 2014-09-23 ENCOUNTER — Ambulatory Visit
Admission: RE | Admit: 2014-09-23 | Discharge: 2014-09-23 | Disposition: A | Discharge status: Home / Self Care | Payer: 59 | Source: Ambulatory Visit | Attending: Radiation Oncology | Admitting: Radiation Oncology

## 2014-09-23 ENCOUNTER — Other Ambulatory Visit (HOSPITAL_BASED_OUTPATIENT_CLINIC_OR_DEPARTMENT_OTHER): Payer: 59

## 2014-09-23 DIAGNOSIS — Z510 Encounter for antineoplastic radiation therapy: Secondary | ICD-10-CM | POA: Diagnosis not present

## 2014-09-23 DIAGNOSIS — C250 Malignant neoplasm of head of pancreas: Secondary | ICD-10-CM

## 2014-09-23 LAB — CBC WITH DIFFERENTIAL/PLATELET
BASO%: 0.2 % (ref 0.0–2.0)
Basophils Absolute: 0 10*3/uL (ref 0.0–0.1)
EOS%: 1.8 % (ref 0.0–7.0)
Eosinophils Absolute: 0.1 10*3/uL (ref 0.0–0.5)
HEMATOCRIT: 30.5 % — AB (ref 34.8–46.6)
HGB: 9.6 g/dL — ABNORMAL LOW (ref 11.6–15.9)
LYMPH%: 8.2 % — AB (ref 14.0–49.7)
MCH: 26 pg (ref 25.1–34.0)
MCHC: 31.5 g/dL (ref 31.5–36.0)
MCV: 82.7 fL (ref 79.5–101.0)
MONO#: 0.4 10*3/uL (ref 0.1–0.9)
MONO%: 8 % (ref 0.0–14.0)
NEUT%: 81.8 % — AB (ref 38.4–76.8)
NEUTROS ABS: 4.1 10*3/uL (ref 1.5–6.5)
PLATELETS: 209 10*3/uL (ref 145–400)
RBC: 3.69 10*6/uL — ABNORMAL LOW (ref 3.70–5.45)
RDW: 15 % — ABNORMAL HIGH (ref 11.2–14.5)
WBC: 5 10*3/uL (ref 3.9–10.3)
lymph#: 0.4 10*3/uL — ABNORMAL LOW (ref 0.9–3.3)

## 2014-09-24 ENCOUNTER — Ambulatory Visit
Admission: RE | Admit: 2014-09-24 | Discharge: 2014-09-24 | Disposition: A | Discharge status: Home / Self Care | Payer: 59 | Source: Ambulatory Visit | Attending: Radiation Oncology | Admitting: Radiation Oncology

## 2014-09-24 ENCOUNTER — Ambulatory Visit: Payer: 59

## 2014-09-24 DIAGNOSIS — Z510 Encounter for antineoplastic radiation therapy: Secondary | ICD-10-CM | POA: Diagnosis not present

## 2014-09-25 ENCOUNTER — Encounter: Payer: Self-pay | Admitting: Radiation Oncology

## 2014-09-25 NOTE — Progress Notes (Signed)
Special treatment procedure note: The patient underwent a special treatment procedure with combined chemoradiation. Chemoradiation leads to increased GI toxicity and also hematologic toxicity. Careful attention was paid to her bowel habits and also blood counts. She began her treatment on 08/19/2014.

## 2014-09-27 ENCOUNTER — Encounter: Payer: Self-pay | Admitting: Radiation Oncology

## 2014-09-27 ENCOUNTER — Ambulatory Visit: Payer: 59

## 2014-09-27 NOTE — Progress Notes (Signed)
[facility] End of Treatment Note  Name:Frances [name]  Date: 09/27/2014 JJH:[id number] DOB:Jan 25, 1966   Status:outpatient    CC: [name], MD  Dr. Julieanne Manson, Dr. Stark Klein  REFERRING PHYSICIAN:   Dr. 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 tolerated her treatment recently well although she had episodes of abdominal cramping, nausea, and loosening of her bowels. She received concomitant Xeloda during course of therapy under the direction of Dr. Benay Spice. Her blood counts remain satisfactory.                          PLAN: Routine followup in one month. Patient instructed to call if questions or worsening complaints in interim.

## 2014-10-07 ENCOUNTER — Encounter (HOSPITAL_COMMUNITY): Payer: Self-pay | Admitting: Pharmacist

## 2014-10-11 NOTE — Patient Instructions (Addendum)
   Your procedure is scheduled on:  Friday, Nov 6  Enter through the [employer] [facility] at:  12:30 PM Pick up the phone at the desk and dial [phone] and inform us of your arrival.  Please call this number if you have any problems the morning of surgery: [phone]  Remember: Do not eat food after midnight: Thursday Do not drink clear liquids after: 10 AM Friday, day of surgery Take these medicines the morning of surgery with a SIP OF WATER:  omeprazole, valtrex if needed  Do not wear jewelry, make-up, or FINGER nail polish No metal in your hair or on your body. Do not wear lotions, powders, perfumes.  You may wear deodorant.  Do not bring valuables to the hospital. Contacts, dentures or bridgework may not be worn into surgery.  Patients discharged on the day of surgery will not be allowed to drive home.  Home with husband Frances [name]  cell [phone]

## 2014-10-11 NOTE — H&P (Addendum)
  QUINISHA MOULD 25852.7 09/28/2014 S:  [name] presents today for saline infusion ultrasound.  She had a recent Mirena IUD that we pulled out and we tried to replace, could not get the other one back in due to the retroflexed uterus.  Due to the severe retroflexion and a question of a palpable mass. We decided to proceed with saline infusion ultrasound.   O:  Ultrasound today does not show any fibroids.  It does show a severely retroflexed uterus.  She does however have bilateral ovarian cysts and a small amount of fluid in the cul-d e-sac.  The right ovary is 15 mm. the left is 20 mm, simple in nature.  Saline infusion ultrasound was carried out without difficulty showing a normal endometrial cavity.  A/P:  Question of abnormal bleeding, endometrial fibroids, unable to replace IUD due to severe retroflexion of the uterus.  Discussed the findings with the patient.  She would be a good candidate for placement under anesthesia.  It would require traction with a tenaculum and dilation of the cervix.  She does want to proceed with that.  Secondly, I do want her to follow up with ultrasound in 3-6 months of the ovaries especially in light of the fact that she has pancreatic cancer.  At this time, she is not very asymptomatic , does have occasional cramping from the cysts.    Louretta Shorten, MD/[id number]   This patient has been seen and examined.   All of her questions were answered.  Labs and vital signs reviewed.  Informed consent has been obtained.  The History and Physical is current. 10/15/14 1400 DL

## 2014-10-12 ENCOUNTER — Encounter (HOSPITAL_COMMUNITY)
Admission: RE | Admit: 2014-10-12 | Discharge: 2014-10-12 | Disposition: A | Discharge status: Home / Self Care | Payer: 59 | Source: Ambulatory Visit | Attending: Obstetrics and Gynecology | Admitting: Obstetrics and Gynecology

## 2014-10-12 ENCOUNTER — Other Ambulatory Visit: Payer: Self-pay

## 2014-10-12 ENCOUNTER — Encounter (HOSPITAL_COMMUNITY): Payer: Self-pay

## 2014-10-12 DIAGNOSIS — K219 Gastro-esophageal reflux disease without esophagitis: Secondary | ICD-10-CM | POA: Diagnosis not present

## 2014-10-12 DIAGNOSIS — N882 Stricture and stenosis of cervix uteri: Secondary | ICD-10-CM | POA: Diagnosis present

## 2014-10-12 DIAGNOSIS — Z923 Personal history of irradiation: Secondary | ICD-10-CM | POA: Diagnosis not present

## 2014-10-12 DIAGNOSIS — I10 Essential (primary) hypertension: Secondary | ICD-10-CM | POA: Diagnosis not present

## 2014-10-12 DIAGNOSIS — Z9221 Personal history of antineoplastic chemotherapy: Secondary | ICD-10-CM | POA: Diagnosis not present

## 2014-10-12 DIAGNOSIS — Z8507 Personal history of malignant neoplasm of pancreas: Secondary | ICD-10-CM | POA: Diagnosis not present

## 2014-10-12 DIAGNOSIS — Z30430 Encounter for insertion of intrauterine contraceptive device: Secondary | ICD-10-CM | POA: Diagnosis not present

## 2014-10-12 HISTORY — DX: Anemia, unspecified: D64.9

## 2014-10-12 HISTORY — DX: Headache: R51

## 2014-10-12 HISTORY — DX: Headache, unspecified: R51.9

## 2014-10-12 LAB — CBC
HCT: 29.7 % — ABNORMAL LOW (ref 36.0–46.0)
HEMOGLOBIN: 9.5 g/dL — AB (ref 12.0–15.0)
MCH: 26.8 pg (ref 26.0–34.0)
MCHC: 32 g/dL (ref 30.0–36.0)
MCV: 83.7 fL (ref 78.0–100.0)
Platelets: 172 10*3/uL (ref 150–400)
RBC: 3.55 MIL/uL — AB (ref 3.87–5.11)
RDW: 16.5 % — ABNORMAL HIGH (ref 11.5–15.5)
WBC: 2.3 10*3/uL — ABNORMAL LOW (ref 4.0–10.5)

## 2014-10-12 LAB — BASIC METABOLIC PANEL
Anion gap: 10 (ref 5–15)
BUN: 5 mg/dL — AB (ref 6–23)
CO2: 30 meq/L (ref 19–32)
Calcium: 8.6 mg/dL (ref 8.4–10.5)
Chloride: 98 mEq/L (ref 96–112)
Creatinine, Ser: 1.32 mg/dL — ABNORMAL HIGH (ref 0.50–1.10)
GFR calc Af Amer: 55 mL/min — ABNORMAL LOW (ref >90)
GFR, EST NON AFRICAN AMERICAN: 48 mL/min — AB (ref >90)
Glucose, Bld: 120 mg/dL — ABNORMAL HIGH (ref 70–99)
Potassium: 2.9 mEq/L — CL (ref 3.7–5.3)
Sodium: 138 mEq/L (ref 137–147)

## 2014-10-12 NOTE — Pre-Procedure Instructions (Signed)
Stat Potassium 2.9 (Low) at PAT visit today.  Dr Lyndle Herrlich informed.  Patient is already taking 20 mEq twice a day.  Telephone order per Dr Lyndle Herrlich for patient to increase K-Dur by 150 mEq in addition to what she is now taking for today, tomorrow and Thursday.  Recheck stat Potassium level on DOS.  Lab in computer/EPIC.  Patient informed [phone] and verified understanding.  Heather at MD's office informed FYI.  Verified Telephone order.

## 2014-10-13 ENCOUNTER — Telehealth: Payer: Self-pay

## 2014-10-13 NOTE — Telephone Encounter (Signed)
Pt requested flu vaccine, called and informed her we would give her her vaccine on 10/18/14 when she comes in for her visit with Dr. Glori Bickers. Pt verbalized understanding, and appts were confirmed with pt.

## 2014-10-15 ENCOUNTER — Ambulatory Visit (HOSPITAL_COMMUNITY): Payer: 59 | Admitting: Anesthesiology

## 2014-10-15 ENCOUNTER — Ambulatory Visit (HOSPITAL_COMMUNITY)
Admission: RE | Admit: 2014-10-15 | Discharge: 2014-10-15 | Disposition: A | Discharge status: Home / Self Care | Payer: 59 | Source: Ambulatory Visit | Attending: Obstetrics and Gynecology | Admitting: Obstetrics and Gynecology

## 2014-10-15 ENCOUNTER — Encounter (HOSPITAL_COMMUNITY)
Admission: RE | Disposition: A | Discharge status: Home / Self Care | Payer: Self-pay | Source: Ambulatory Visit | Attending: Obstetrics and Gynecology

## 2014-10-15 DIAGNOSIS — Z30430 Encounter for insertion of intrauterine contraceptive device: Secondary | ICD-10-CM | POA: Insufficient documentation

## 2014-10-15 DIAGNOSIS — I10 Essential (primary) hypertension: Secondary | ICD-10-CM | POA: Insufficient documentation

## 2014-10-15 DIAGNOSIS — N882 Stricture and stenosis of cervix uteri: Secondary | ICD-10-CM | POA: Insufficient documentation

## 2014-10-15 DIAGNOSIS — Z9221 Personal history of antineoplastic chemotherapy: Secondary | ICD-10-CM | POA: Insufficient documentation

## 2014-10-15 DIAGNOSIS — Z8507 Personal history of malignant neoplasm of pancreas: Secondary | ICD-10-CM | POA: Insufficient documentation

## 2014-10-15 DIAGNOSIS — K219 Gastro-esophageal reflux disease without esophagitis: Secondary | ICD-10-CM | POA: Insufficient documentation

## 2014-10-15 DIAGNOSIS — Z923 Personal history of irradiation: Secondary | ICD-10-CM | POA: Insufficient documentation

## 2014-10-15 HISTORY — PX: INTRAUTERINE DEVICE (IUD) INSERTION: SHX5877

## 2014-10-15 LAB — PREGNANCY, URINE: Preg Test, Ur: NEGATIVE

## 2014-10-15 LAB — POTASSIUM: POTASSIUM: 4.2 meq/L (ref 3.7–5.3)

## 2014-10-15 SURGERY — INSERTION, INTRAUTERINE DEVICE
Anesthesia: Monitor Anesthesia Care | Site: Vagina

## 2014-10-15 MED ORDER — LIDOCAINE HCL (CARDIAC) 20 MG/ML IV SOLN
INTRAVENOUS | Status: AC
  Filled 2014-10-15: qty 5

## 2014-10-15 MED ORDER — FENTANYL CITRATE 0.05 MG/ML IJ SOLN
INTRAMUSCULAR | Status: AC
  Filled 2014-10-15: qty 2

## 2014-10-15 MED ORDER — MIDAZOLAM HCL 2 MG/2ML IJ SOLN
INTRAMUSCULAR | Status: AC
  Filled 2014-10-15: qty 2

## 2014-10-15 MED ORDER — LIDOCAINE HCL 1 % IJ SOLN
INTRAMUSCULAR | Status: DC | PRN
  Administered 2014-10-15: 20 mL

## 2014-10-15 MED ORDER — SCOPOLAMINE 1 MG/3DAYS TD PT72
MEDICATED_PATCH | TRANSDERMAL | Status: AC
  Administered 2014-10-15: 1.5 mg via TRANSDERMAL
  Filled 2014-10-15: qty 1

## 2014-10-15 MED ORDER — PROPOFOL 10 MG/ML IV BOLUS
INTRAVENOUS | Status: DC | PRN
  Administered 2014-10-15: 30 mg via INTRAVENOUS
  Administered 2014-10-15: 20 mg via INTRAVENOUS

## 2014-10-15 MED ORDER — SCOPOLAMINE 1 MG/3DAYS TD PT72
1.0000 | MEDICATED_PATCH | Freq: Once | TRANSDERMAL | Status: DC
  Administered 2014-10-15: 1.5 mg via TRANSDERMAL

## 2014-10-15 MED ORDER — PROPOFOL 10 MG/ML IV EMUL
INTRAVENOUS | Status: AC
  Filled 2014-10-15: qty 40

## 2014-10-15 MED ORDER — PROPOFOL INFUSION 10 MG/ML OPTIME
INTRAVENOUS | Status: DC | PRN
  Administered 2014-10-15: 100 ug/kg/min via INTRAVENOUS

## 2014-10-15 MED ORDER — DEXAMETHASONE SODIUM PHOSPHATE 10 MG/ML IJ SOLN
INTRAMUSCULAR | Status: AC
  Filled 2014-10-15: qty 1

## 2014-10-15 MED ORDER — MIDAZOLAM HCL 2 MG/2ML IJ SOLN
INTRAMUSCULAR | Status: DC | PRN
  Administered 2014-10-15: 2 mg via INTRAVENOUS

## 2014-10-15 MED ORDER — GLYCOPYRROLATE 0.2 MG/ML IJ SOLN
INTRAMUSCULAR | Status: DC | PRN
  Administered 2014-10-15: 0.1 mg via INTRAVENOUS

## 2014-10-15 MED ORDER — LIDOCAINE HCL (CARDIAC) 20 MG/ML IV SOLN
INTRAVENOUS | Status: DC | PRN
  Administered 2014-10-15: 80 mg via INTRAVENOUS

## 2014-10-15 MED ORDER — ONDANSETRON HCL 4 MG/2ML IJ SOLN
INTRAMUSCULAR | Status: AC
  Filled 2014-10-15: qty 2

## 2014-10-15 MED ORDER — DEXAMETHASONE SODIUM PHOSPHATE 4 MG/ML IJ SOLN
INTRAMUSCULAR | Status: AC
  Filled 2014-10-15: qty 1

## 2014-10-15 MED ORDER — LIDOCAINE HCL 1 % IJ SOLN
INTRAMUSCULAR | Status: AC
  Filled 2014-10-15: qty 20

## 2014-10-15 MED ORDER — LACTATED RINGERS IV SOLN
INTRAVENOUS | Status: DC
  Administered 2014-10-15: 13:00:00 via INTRAVENOUS

## 2014-10-15 MED ORDER — FENTANYL CITRATE 0.05 MG/ML IJ SOLN
INTRAMUSCULAR | Status: DC | PRN
  Administered 2014-10-15: 100 ug via INTRAVENOUS
  Administered 2014-10-15 (×2): 50 ug via INTRAVENOUS

## 2014-10-15 MED ORDER — DEXAMETHASONE SODIUM PHOSPHATE 10 MG/ML IJ SOLN
INTRAMUSCULAR | Status: DC | PRN
  Administered 2014-10-15: 4 mg via INTRAVENOUS

## 2014-10-15 MED ORDER — GLYCOPYRROLATE 0.2 MG/ML IJ SOLN
INTRAMUSCULAR | Status: AC
  Filled 2014-10-15: qty 1

## 2014-10-15 MED ORDER — ONDANSETRON HCL 4 MG/2ML IJ SOLN
INTRAMUSCULAR | Status: DC | PRN
  Administered 2014-10-15: 4 mg via INTRAVENOUS

## 2014-10-15 SURGICAL SUPPLY — 12 items
CATH ROBINSON RED A/P 16FR (CATHETERS) ×3 IMPLANT
CLOTH BEACON ORANGE TIMEOUT ST (SAFETY) ×3 IMPLANT
CONTAINER PREFILL 10% NBF 60ML (FORM) ×6 IMPLANT
DILATOR CANAL MILEX (MISCELLANEOUS) ×3 IMPLANT
GOWN STRL REUS W/TWL LRG LVL3 (GOWN DISPOSABLE) ×6 IMPLANT
Mirena ×3 IMPLANT
NEEDLE SPNL 22GX3.5 QUINCKE BK (NEEDLE) ×3 IMPLANT
PACK VAGINAL MINOR WOMEN LF (CUSTOM PROCEDURE TRAY) ×3 IMPLANT
PAD PREP 24X48 CUFFED NSTRL (MISCELLANEOUS) ×3 IMPLANT
SYR CONTROL 10ML LL (SYRINGE) ×3 IMPLANT
TOWEL OR 17X24 6PK STRL BLUE (TOWEL DISPOSABLE) ×6 IMPLANT
WATER STERILE IRR 1000ML POUR (IV SOLUTION) ×3 IMPLANT

## 2014-10-15 NOTE — Transfer of Care (Signed)
Immediate Anesthesia Transfer of Care Note  Patient: Frances [name]  Procedure(s) Performed: Procedure(s): cervical dilation  with INTRAUTERINE DEVICE (IUD) INSERTION  (N/A)  Patient Location: PACU  Anesthesia Type:MAC  Level of Consciousness: awake, alert  and oriented  Airway & Oxygen Therapy: Patient Spontanous Breathing and Patient connected to nasal cannula oxygen  Post-op Assessment: Report given to PACU RN and Post -op Vital signs reviewed and stable  Post vital signs: Reviewed and stable  Complications: No apparent anesthesia complications

## 2014-10-15 NOTE — Anesthesia Preprocedure Evaluation (Addendum)
Anesthesia Evaluation  Patient identified by MRN, date of birth, ID band Patient awake    Reviewed: Allergy & Precautions, H&P , NPO status , Patient's Chart, lab work & pertinent test results, reviewed documented beta blocker date and time   History of Anesthesia Complications Negative for: history of anesthetic complications  Airway Mallampati: I  TM Distance: >3 FB Neck ROM: full    Dental  (+) Teeth Intact   Pulmonary neg pulmonary ROS,  breath sounds clear to auscultation  Pulmonary exam normal       Cardiovascular hypertension, Rhythm:regular Rate:Normal     Neuro/Psych negative neurological ROS  negative psych ROS   GI/Hepatic Neg liver ROS, GERD-  Medicated,Pancreatic cancer - dx 11/14 - s/p chemo 7/15, radiation 10/15   Endo/Other  negative endocrine ROS  Renal/GU Cr 1.32 - this is new for her K was 2.9 on 11/3, repeat pending  Female GU complaint     Musculoskeletal   Abdominal   Peds  Hematology  (+) anemia ,   Anesthesia Other Findings Clear liquids until 10 am  Reproductive/Obstetrics negative OB ROS                           Anesthesia Physical Anesthesia Plan  ASA: III  Anesthesia Plan: MAC   Post-op Pain Management:    Induction:   Airway Management Planned:   Additional Equipment:   Intra-op Plan:   Post-operative Plan:   Informed Consent: I have reviewed the patients History and Physical, chart, labs and discussed the procedure including the risks, benefits and alternatives for the proposed anesthesia with the patient or authorized representative who has indicated his/her understanding and acceptance.   Dental Advisory Given  Plan Discussed with: CRNA and Surgeon  Anesthesia Plan Comments:         Anesthesia Quick Evaluation

## 2014-10-15 NOTE — Brief Op Note (Signed)
10/15/2014  2:36 PM  PATIENT:  Frances [name]  48 y.o. female  PRE-OPERATIVE DIAGNOSIS:  retroflexed uterus, cervical stenois, desires IUD insertion  POST-OPERATIVE DIAGNOSIS:  Same  PROCEDURE:  Procedure(s): cervical dilation  with INTRAUTERINE DEVICE (IUD) INSERTION  (N/A)  SURGEON:  Surgeon(s) and Role:    * Luz Lex, MD - Primary  PHYSICIAN ASSISTANT:   ASSISTANTS: none   ANESTHESIA:   local and MAC  EBL:     BLOOD ADMINISTERED:none  DRAINS: none   LOCAL MEDICATIONS USED:  LIDOCAINE   SPECIMEN:  No Specimen  DISPOSITION OF SPECIMEN:  N/A  COUNTS:  YES  TOURNIQUET:  * No tourniquets in log *  DICTATION: .Other Dictation: Dictation Number 1  PLAN OF CARE: Discharge to home after PACU  PATIENT DISPOSITION:  PACU - hemodynamically stable.   Delay start of Pharmacological VTE agent (>24hrs) due to surgical blood loss or risk of bleeding: not applicable

## 2014-10-15 NOTE — Discharge Instructions (Signed)

## 2014-10-16 NOTE — Op Note (Signed)
NAME:  CHANEY, INGRAM NO.:  [id number]  MEDICAL RECORD NO.:  [id number]  LOCATION:  [facility]                          FACILITY:  [facility]  PHYSICIAN:  Monia Sabal. Corinna Capra, M.D.    DATE OF BIRTH:  [birth year]  DATE OF PROCEDURE:  10/15/2014 DATE OF DISCHARGE:  10/15/2014                              OPERATIVE REPORT   PREOPERATIVE DIAGNOSIS:  Retroflexed uterus, cervical stenosis, desires intrauterine device insertion.  POSTOPERATIVE DIAGNOSIS:  Retroflexed uterus, cervical stenosis, desires intrauterine device insertion.  PROCEDURE:  Cervical dilation with intrauterine device insertion with Mirena intrauterine device..  SURGEON:  Monia Sabal. Corinna Capra, MD  ANESTHESIA:  Monitored anesthetic care and local paracervical block.  INDICATIONS:  Ms. Ketterman is a 48 year old had a previous IUD was removed about a month ago.  Unable to replace the IUD due to this severe retroflexion of the uterus and also cervical stenosis.  She had a saline infusion ultrasound showing no blocking masses or intracavitary masses. Because of the inability to replace this in the office, she desired replaced under anesthesia so we could adequately dilate the cervix to place it.  Risks and benefits were discussed at length and informed consent was obtained.  DESCRIPTION OF PROCEDURE:  After adequate analgesia, the patient was placed in dorsal lithotomy position.  She was sterilely prepped and draped.  Bladder sterilely drained.  Graves speculum was placed.  A paracervical block was placed with 1% Xylocaine, total of 20 mL used. Uterus was sounded to 9 cm in the retroflexed position.  The cervix was easily dilated to a #23 Pratt dilator.  The Mirena IUD was inserted according to manufacturer's recommendations.  String cut to 1 inch in length, tenaculum removed, noted to be hemostatic.  The speculum was then removed.  The patient was transferred to recovery room in stable condition.  Sponge and instrument  count was normal x3.  ESTIMATED BLOOD LOSS:  Minimal.  DISPOSITION:  The patient will be discharged home.  Will follow up in the office in 2-3 weeks.  Sent her with a routine instruction sheet for IUD insertion and also D and C.  Told to return for increased pain, fever, or bleeding.     Monia Sabal Corinna Capra, M.D.    DCL/MEDQ  D:  10/15/2014  T:  10/16/2014  Job:  [id number]

## 2014-10-18 ENCOUNTER — Other Ambulatory Visit (HOSPITAL_BASED_OUTPATIENT_CLINIC_OR_DEPARTMENT_OTHER): Payer: 59

## 2014-10-18 ENCOUNTER — Ambulatory Visit: Payer: 59

## 2014-10-18 ENCOUNTER — Ambulatory Visit (HOSPITAL_BASED_OUTPATIENT_CLINIC_OR_DEPARTMENT_OTHER): Payer: 59

## 2014-10-18 ENCOUNTER — Ambulatory Visit (HOSPITAL_BASED_OUTPATIENT_CLINIC_OR_DEPARTMENT_OTHER): Payer: 59 | Admitting: Oncology

## 2014-10-18 ENCOUNTER — Encounter (HOSPITAL_COMMUNITY): Payer: Self-pay | Admitting: Obstetrics and Gynecology

## 2014-10-18 ENCOUNTER — Telehealth: Payer: Self-pay | Admitting: Oncology

## 2014-10-18 VITALS — BP 138/93 | HR 83 | Temp 98.4°F | Resp 18 | Ht 64.0 in | Wt 189.3 lb

## 2014-10-18 DIAGNOSIS — Z95828 Presence of other vascular implants and grafts: Secondary | ICD-10-CM

## 2014-10-18 DIAGNOSIS — R110 Nausea: Secondary | ICD-10-CM

## 2014-10-18 DIAGNOSIS — C250 Malignant neoplasm of head of pancreas: Secondary | ICD-10-CM

## 2014-10-18 DIAGNOSIS — R109 Unspecified abdominal pain: Secondary | ICD-10-CM

## 2014-10-18 DIAGNOSIS — I10 Essential (primary) hypertension: Secondary | ICD-10-CM

## 2014-10-18 DIAGNOSIS — R944 Abnormal results of kidney function studies: Secondary | ICD-10-CM

## 2014-10-18 DIAGNOSIS — Z23 Encounter for immunization: Secondary | ICD-10-CM

## 2014-10-18 LAB — COMPREHENSIVE METABOLIC PANEL (CC13)
ALBUMIN: 2.9 g/dL — AB (ref 3.5–5.0)
ALK PHOS: 369 U/L — AB (ref 40–150)
ALT: 72 U/L — ABNORMAL HIGH (ref 0–55)
AST: 71 U/L — ABNORMAL HIGH (ref 5–34)
Anion Gap: 8 mEq/L (ref 3–11)
BILIRUBIN TOTAL: 0.29 mg/dL (ref 0.20–1.20)
BUN: 6.9 mg/dL — ABNORMAL LOW (ref 7.0–26.0)
CO2: 29 mEq/L (ref 22–29)
Calcium: 8.9 mg/dL (ref 8.4–10.4)
Chloride: 103 mEq/L (ref 98–109)
Creatinine: 1.2 mg/dL — ABNORMAL HIGH (ref 0.6–1.1)
GLUCOSE: 114 mg/dL (ref 70–140)
Potassium: 3.7 mEq/L (ref 3.5–5.1)
Sodium: 140 mEq/L (ref 136–145)
Total Protein: 6.2 g/dL — ABNORMAL LOW (ref 6.4–8.3)

## 2014-10-18 LAB — CBC WITH DIFFERENTIAL/PLATELET
BASO%: 0.9 % (ref 0.0–2.0)
BASOS ABS: 0 10*3/uL (ref 0.0–0.1)
EOS ABS: 0.2 10*3/uL (ref 0.0–0.5)
EOS%: 6.5 % (ref 0.0–7.0)
HCT: 28 % — ABNORMAL LOW (ref 34.8–46.6)
HEMOGLOBIN: 8.9 g/dL — AB (ref 11.6–15.9)
LYMPH%: 9.4 % — AB (ref 14.0–49.7)
MCH: 26.9 pg (ref 25.1–34.0)
MCHC: 31.8 g/dL (ref 31.5–36.0)
MCV: 84.5 fL (ref 79.5–101.0)
MONO#: 0.3 10*3/uL (ref 0.1–0.9)
MONO%: 11.1 % (ref 0.0–14.0)
NEUT%: 72.1 % (ref 38.4–76.8)
NEUTROS ABS: 1.8 10*3/uL (ref 1.5–6.5)
PLATELETS: 190 10*3/uL (ref 145–400)
RBC: 3.31 10*6/uL — ABNORMAL LOW (ref 3.70–5.45)
RDW: 18.7 % — ABNORMAL HIGH (ref 11.2–14.5)
WBC: 2.4 10*3/uL — ABNORMAL LOW (ref 3.9–10.3)
lymph#: 0.2 10*3/uL — ABNORMAL LOW (ref 0.9–3.3)

## 2014-10-18 LAB — CEA: CEA: 19.2 ng/mL — ABNORMAL HIGH (ref 0.0–5.0)

## 2014-10-18 MED ORDER — INFLUENZA VAC SPLIT QUAD 0.5 ML IM SUSY
0.5000 mL | PREFILLED_SYRINGE | Freq: Once | INTRAMUSCULAR | Status: AC
  Administered 2014-10-18: 0.5 mL via INTRAMUSCULAR
  Filled 2014-10-18: qty 0.5

## 2014-10-18 MED ORDER — HEPARIN SOD (PORK) LOCK FLUSH 100 UNIT/ML IV SOLN
500.0000 [IU] | Freq: Once | INTRAVENOUS | Status: AC
  Administered 2014-10-18: 500 [IU] via INTRAVENOUS
  Filled 2014-10-18: qty 5

## 2014-10-18 MED ORDER — SODIUM CHLORIDE 0.9 % IJ SOLN
10.0000 mL | INTRAMUSCULAR | Status: DC | PRN
  Administered 2014-10-18: 10 mL via INTRAVENOUS
  Filled 2014-10-18: qty 10

## 2014-10-18 NOTE — Patient Instructions (Signed)

## 2014-10-18 NOTE — Progress Notes (Signed)
[place] OFFICE PROGRESS NOTE   Diagnosis: pancreas cancer  INTERVAL HISTORY:   She returns as scheduled. She completed Xeloda and radiation 09/24/2014. Nausea has improved. She has several loose stools each day, but no diarrhea. No pain. Her appetite has improved. She is working.persistent numbness in the distal fingers and feet.  Objective:  Vital signs in last 24 hours:  Blood pressure 146/97, pulse 91, temperature 98.4 F (36.9 C), temperature source Oral, resp. rate 18, height 5\' 4"  (1.626 m), weight 189 lb 4.8 oz (85.866 kg), last menstrual period 10/04/2014, SpO2 100 %.    HEENT: the thrush or ulcers Resp: lungs clear bilaterally Cardio: regular rate and rhythm GI: no hepatomegaly, nontender, no mass Vascular: no leg edema  Skin:hyperpigmentation of the hands   Portacath/PICC-without erythema  Lab Results:  Lab Results  Component Value Date   WBC 2.4* 10/18/2014   HGB 8.9* 10/18/2014   HCT 28.0* 10/18/2014   MCV 84.5 10/18/2014   PLT 190 10/18/2014   NEUTROABS 1.8 10/18/2014   Potassium 2.9, creatinine 1.3 to 10/12/2014 Potassium 4.2 on 10/15/2014  Lab Results  Component Value Date   CEA 22.7* 08/05/2014    Medications: I have reviewed the patient's current medications.  Assessment/Plan: 1. 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 Initiation of FOLFIRINOX 10/29/2013. She did not complete the 5-FU pump due to developing altered speech. Cycle 2 completed 11/12/2013. Cycle 3 completed 11/26/2013. Cycle 4 completed 12/09/2013, cycle 5 12/24/2013, cycle 6 01/14/2014, cycle 7 01/28/2014, cycle 8 02/11/2014, cycle 9 02/25/2014, cycle 10 03/11/2014  Restaging CT 01/06/2012 with a slight increase in the size of the pancreas mass and no clear evidence of distant metastatic disease.   CEA improved 01/28/2014.   Cycle 1 of modified FOLFIRI 03/25/2014.   Cycle 2 modified FOLFIRI 04/08/2014.   CEA stable 04/26/2014.    Restaging CT abdomen/pelvis 04/26/2014 with interval decrease in the size of the pancreatic head mass. Stable mesenteric and retroperitoneal lymphadenopathy. Stable splenomegaly. 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 Stable small liver lesions consistent with benign cysts.   Continuation of FOLFIRI on a 3 week schedule.   CEA increased (13.8) on 06/03/2014.   CEA increased (23.3) on 07/08/2014.   Restaging CT evaluation 08/03/2014 with mild interval enlargement of the pancreatic head mass. Slight increase in the size of small central mesenteric lymph nodes. 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 Common bile duct stent in place without evidence of biliary duct dilatation. Stable small hypodensities within the liver.   Initiation of concurrent capecitabine and radiation 08/19/2014, completed 09/24/2014 2. Altered speech prior to completion of cycle 1 of FOLFIRINOX, similar symptoms and tongue/lip twitching following the oxaliplatin with cycle 2 FOLFIRINOX. Oxaliplatin was dose reduced and given over 4 hours beginning with cycle 3. She had similar neurologic symptoms after subsequent cycles of FOLFIRINOX. 3. Status post cholecystectomy 10/02/2013 with pathology revealing chronic cholecystitis. 4. Abdomen/back pain secondary to #1. Improved following Xeloda/radiation 5. Hypertension. 6. Status post Port-A-Cath placement 10/28/2013. 7. History of elevated liver enzyme-improved. 8. History of hypokalemia. Taking a potassium supplement. 9. History of neutropenia secondary to chemotherapy. Neulasta was added beginning with cycle 6 of FOLFIRINOX on 01/14/2014. 10. Delayed nausea. Improved with the addition of Aloxi. 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  Disposition:  Ms. Chaney has completed Xeloda and radiation. The abdominal pain has improved. She had nausea during the course of treatment,  this has improved. The plan  is to follow her with observation for now. We will check the CEA today.  The creatinine was elevated last week. We will check a chemistry panel today.  Ms. Strandberg received an influenza vaccine today. She will return for an office visit and Port-A-Cath flush in one month.  Betsy Coder, MD  10/18/2014  8:42 AM

## 2014-10-18 NOTE — Telephone Encounter (Signed)
Gave avs & cal for Nov/Dec. °

## 2014-10-19 ENCOUNTER — Telehealth: Payer: Self-pay | Admitting: *Deleted

## 2014-10-19 DIAGNOSIS — C250 Malignant neoplasm of head of pancreas: Secondary | ICD-10-CM

## 2014-10-19 LAB — CEA: CEA: 19.9 ng/mL — ABNORMAL HIGH (ref 0.0–5.0)

## 2014-10-19 NOTE — Telephone Encounter (Signed)
-----   Message from Ladell Pier, MD sent at 10/18/2014  7:31 PM EST ----- Please call patient.liver enzymes are elevated, could be related to obstruction of the stent or chemo/xrt, call for fever, repeat cmet in 2 weeks

## 2014-10-19 NOTE — Telephone Encounter (Signed)
Called pt with lab results: Liver enzymes are elevated, could be related to obstruction of stent or chemo/XRT. Call office for fever, repeat CMET in 2 weeks. Pt voiced understanding.

## 2014-10-19 NOTE — Anesthesia Postprocedure Evaluation (Signed)
  Anesthesia Post-op Note  Patient: Frances [name]  Procedure(s) Performed: Procedure(s): cervical dilation  with INTRAUTERINE DEVICE (IUD) INSERTION  (N/A) Patient is awake and responsive. Pain and nausea are reasonably well controlled. Vital signs are stable and clinically acceptable. Oxygen saturation is clinically acceptable. There are no apparent anesthetic complications at this time. Patient is ready for discharge.

## 2014-10-22 ENCOUNTER — Encounter: Payer: Self-pay | Admitting: *Deleted

## 2014-10-26 ENCOUNTER — Telehealth: Payer: Self-pay | Admitting: *Deleted

## 2014-10-26 ENCOUNTER — Ambulatory Visit: Admission: RE | Admit: 2014-10-26 | Payer: 59 | Source: Ambulatory Visit | Admitting: Radiation Oncology

## 2014-10-26 NOTE — Telephone Encounter (Signed)
Patient was no show for FU today. Called and left vm requesting she call back and reschedule. Left call back name and 2 numbers.

## 2014-10-29 ENCOUNTER — Other Ambulatory Visit: Payer: Self-pay | Admitting: Nurse Practitioner

## 2014-10-29 ENCOUNTER — Telehealth: Payer: Self-pay | Admitting: Nurse Practitioner

## 2014-10-29 ENCOUNTER — Telehealth: Payer: Self-pay | Admitting: *Deleted

## 2014-10-29 NOTE — Telephone Encounter (Signed)
Per Ned Card, NP; notified pt as long as she is not having any numbness, weakness, bowl/bladder dysfunction; NP wants to see her in the office Monday 11/23 @ 9:15 for evaluation.  Pt states she is not having any of that "just that nagging pain over hip and down my leg"  Pt instructed to call over weekend if anything changes; experiences any numbness; weakness, etc.  Pt verbalized understanding and states she will be here Monday.

## 2014-10-29 NOTE — Telephone Encounter (Signed)
Pt scheduled for ML visit per 11/20 POF pt is aware..... Frances [name]

## 2014-10-29 NOTE — Telephone Encounter (Signed)
Pt called reports, "I'm having a nagging pain on my left side/hip area that goes down my leg"  Pt states "everything else is normal" denies N/V/Fever/diarrhea; eating/drinking well.  Pt states this has been going on for a "couple of weeks but not every day"  Pt states it happens mostly at night and wants to know if this is related to the treatment or is it the cancer?  What to do about it?  Pt states she has not taken her percocet for the pain "it's not that bad yet"  Note to Ned Card, NP

## 2014-11-01 ENCOUNTER — Ambulatory Visit (HOSPITAL_COMMUNITY)
Admission: RE | Admit: 2014-11-01 | Discharge: 2014-11-01 | Disposition: A | Discharge status: Home / Self Care | Payer: 59 | Source: Ambulatory Visit | Attending: Nurse Practitioner | Admitting: Nurse Practitioner

## 2014-11-01 ENCOUNTER — Ambulatory Visit (HOSPITAL_BASED_OUTPATIENT_CLINIC_OR_DEPARTMENT_OTHER): Payer: 59 | Admitting: Nurse Practitioner

## 2014-11-01 ENCOUNTER — Telehealth: Payer: Self-pay | Admitting: Oncology

## 2014-11-01 ENCOUNTER — Other Ambulatory Visit (HOSPITAL_BASED_OUTPATIENT_CLINIC_OR_DEPARTMENT_OTHER): Payer: 59

## 2014-11-01 VITALS — BP 146/93 | HR 86 | Temp 98.3°F | Resp 18 | Ht 64.0 in | Wt 186.0 lb

## 2014-11-01 DIAGNOSIS — M25552 Pain in left hip: Secondary | ICD-10-CM | POA: Insufficient documentation

## 2014-11-01 DIAGNOSIS — C250 Malignant neoplasm of head of pancreas: Secondary | ICD-10-CM

## 2014-11-01 DIAGNOSIS — M479 Spondylosis, unspecified: Secondary | ICD-10-CM | POA: Insufficient documentation

## 2014-11-01 DIAGNOSIS — I878 Other specified disorders of veins: Secondary | ICD-10-CM | POA: Diagnosis not present

## 2014-11-01 DIAGNOSIS — E876 Hypokalemia: Secondary | ICD-10-CM

## 2014-11-01 DIAGNOSIS — Z975 Presence of (intrauterine) contraceptive device: Secondary | ICD-10-CM | POA: Insufficient documentation

## 2014-11-01 DIAGNOSIS — M545 Low back pain: Secondary | ICD-10-CM

## 2014-11-01 DIAGNOSIS — Z8507 Personal history of malignant neoplasm of pancreas: Secondary | ICD-10-CM | POA: Insufficient documentation

## 2014-11-01 DIAGNOSIS — Z23 Encounter for immunization: Secondary | ICD-10-CM

## 2014-11-01 DIAGNOSIS — G620 Drug-induced polyneuropathy: Secondary | ICD-10-CM

## 2014-11-01 LAB — COMPREHENSIVE METABOLIC PANEL (CC13)
ALBUMIN: 2.8 g/dL — AB (ref 3.5–5.0)
ALT: 15 U/L (ref 0–55)
ANION GAP: 9 meq/L (ref 3–11)
AST: 19 U/L (ref 5–34)
Alkaline Phosphatase: 265 U/L — ABNORMAL HIGH (ref 40–150)
BUN: 5.5 mg/dL — ABNORMAL LOW (ref 7.0–26.0)
CO2: 27 meq/L (ref 22–29)
CREATININE: 1.2 mg/dL — AB (ref 0.6–1.1)
Calcium: 8.8 mg/dL (ref 8.4–10.4)
Chloride: 105 mEq/L (ref 98–109)
Glucose: 93 mg/dl (ref 70–140)
Potassium: 3.7 mEq/L (ref 3.5–5.1)
SODIUM: 141 meq/L (ref 136–145)
TOTAL PROTEIN: 5.9 g/dL — AB (ref 6.4–8.3)
Total Bilirubin: 0.37 mg/dL (ref 0.20–1.20)

## 2014-11-01 LAB — CBC WITH DIFFERENTIAL/PLATELET
BASO%: 0 % (ref 0.0–2.0)
Basophils Absolute: 0 10*3/uL (ref 0.0–0.1)
EOS%: 4.7 % (ref 0.0–7.0)
Eosinophils Absolute: 0.1 10*3/uL (ref 0.0–0.5)
HEMATOCRIT: 29.9 % — AB (ref 34.8–46.6)
HGB: 9.4 g/dL — ABNORMAL LOW (ref 11.6–15.9)
LYMPH#: 0.4 10*3/uL — AB (ref 0.9–3.3)
LYMPH%: 16.1 % (ref 14.0–49.7)
MCH: 26.6 pg (ref 25.1–34.0)
MCHC: 31.4 g/dL — AB (ref 31.5–36.0)
MCV: 84.7 fL (ref 79.5–101.0)
MONO#: 0.3 10*3/uL (ref 0.1–0.9)
MONO%: 11.9 % (ref 0.0–14.0)
NEUT#: 1.6 10*3/uL (ref 1.5–6.5)
NEUT%: 67.3 % (ref 38.4–76.8)
Platelets: 158 10*3/uL (ref 145–400)
RBC: 3.53 10*6/uL — ABNORMAL LOW (ref 3.70–5.45)
RDW: 17 % — ABNORMAL HIGH (ref 11.2–14.5)
WBC: 2.4 10*3/uL — AB (ref 3.9–10.3)

## 2014-11-01 NOTE — Telephone Encounter (Signed)
gv and printed pt avs...pt ok and aware

## 2014-11-01 NOTE — Progress Notes (Addendum)
[place] OFFICE PROGRESS NOTE   Diagnosis:  Pancreas cancer  INTERVAL HISTORY:   Frances [name] returns prior to scheduled followup for evaluation of back and left leg pain. She reports a 2 week history of intermittent "nagging, aching" pain at the low back which radiates to the lateral left leg. She also notes the pain when she has a bowel movement and when laying on the left side at bedtime. She takes Tylenol as needed. No associated weakness or numbness. No bowel or bladder dysfunction.  Objective:  Vital signs in last 24 hours:  Blood pressure 146/93, pulse 86, temperature 98.3 F (36.8 C), temperature source Oral, resp. rate 18, height 5\' 4"  (1.626 m), weight 186 lb (84.369 kg), last menstrual period 10/04/2014.    HEENT: no thrush or ulcers. Resp: lungs clear bilaterally. Cardio: regular rate and rhythm. GI: abdomen soft and nontender. No hepatomegaly. Vascular: no leg edema. Calves soft and nontender. Neuro: motor strength 5 over 5. Knee DTRs 2+, symmetric.  Skin: no rash. MSK: nontender over the left hip; mild tenderness over the left lateral thigh. No pain with internal/external rotation.    Lab Results:  Lab Results  Component Value Date   WBC 2.4* 11/01/2014   HGB 9.4* 11/01/2014   HCT 29.9* 11/01/2014   MCV 84.7 11/01/2014   PLT 158 11/01/2014   NEUTROABS 1.6 11/01/2014    Imaging:  No results found.  Medications: I have reviewed the patient's current medications.  Assessment/Plan: 1. 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 Initiation of FOLFIRINOX 10/29/2013. She did not complete the 5-FU pump due to developing altered speech. Cycle 2 completed 11/12/2013. Cycle 3 completed 11/26/2013. Cycle 4 completed 12/09/2013, cycle 5 12/24/2013, cycle 6 01/14/2014, cycle 7 01/28/2014, cycle 8 02/11/2014, cycle 9 02/25/2014, cycle 10 03/11/2014  Restaging CT 01/06/2012 with a slight increase in the size of the pancreas mass and no  clear evidence of distant metastatic disease.   CEA improved 01/28/2014.   Cycle 1 of modified FOLFIRI 03/25/2014.   Cycle 2 modified FOLFIRI 04/08/2014.   CEA stable 04/26/2014.   Restaging CT abdomen/pelvis 04/26/2014 with interval decrease in the size of the pancreatic head mass. Stable mesenteric and retroperitoneal lymphadenopathy. Stable splenomegaly. 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 Stable small liver lesions consistent with benign cysts.   Continuation of FOLFIRI on a 3 week schedule.   CEA increased (13.8) on 06/03/2014.   CEA increased (23.3) on 07/08/2014.   Restaging CT evaluation 08/03/2014 with mild interval enlargement of the pancreatic head mass. Slight increase in the size of small central mesenteric lymph nodes. 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 Common bile duct stent in place without evidence of biliary duct dilatation. Stable small hypodensities within the liver.   Initiation of concurrent capecitabine and radiation 08/19/2014, completed 09/24/2014 2. Altered speech prior to completion of cycle 1 of FOLFIRINOX, similar symptoms and tongue/lip twitching following the oxaliplatin with cycle 2 FOLFIRINOX. Oxaliplatin was dose reduced and given over 4 hours beginning with cycle 3. She had similar neurologic symptoms after subsequent cycles of FOLFIRINOX. 3. Status post cholecystectomy 10/02/2013 with pathology revealing chronic cholecystitis. 4. Abdomen/back pain secondary to #1. Improved following Xeloda/radiation 5. Hypertension. 6. Status post Port-A-Cath placement 10/28/2013. 7. History of elevated liver enzyme-improved. 8. History of hypokalemia. Taking a potassium supplement. 9. History of neutropenia secondary to chemotherapy. Neulasta was added beginning with cycle 6 of FOLFIRINOX on 01/14/2014.  10. Delayed nausea. Improved with the  addition of Aloxi. 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   Disposition: Frances [name] appears stable. She complains of low back pain radiating to the left lateral leg. Etiology is unclear. We are referring her for plain x-rays of the lumbosacral spine and left hip. We will contact her with the result. She is scheduled to return for a followup visit on 11/15/2014. She understands to contact the office if the pain worsens or she develops any neurologic-type symptoms.  Patient seen with Dr. Benay Spice. 25 minutes were spent face-to-face at today's visit with the majority of the time involved in counseling/coordination of care.    Ned Card ANP/GNP-BC   11/01/2014  10:03 AM   This was a shared visit with Ned Card. The sacral and left leg pain is most likely related to a benign musculoskeletal condition. We will check plain x-rays today. If the pain persist she will be referred for CT imaging.  Julieanne Manson, M.D.

## 2014-11-03 ENCOUNTER — Other Ambulatory Visit: Payer: Self-pay | Admitting: Gastroenterology

## 2014-11-08 ENCOUNTER — Encounter: Payer: Self-pay | Admitting: *Deleted

## 2014-11-09 ENCOUNTER — Encounter: Payer: Self-pay | Admitting: Radiation Oncology

## 2014-11-09 ENCOUNTER — Ambulatory Visit
Admission: RE | Admit: 2014-11-09 | Discharge: 2014-11-09 | Disposition: A | Discharge status: Home / Self Care | Payer: 59 | Source: Ambulatory Visit | Attending: Radiation Oncology | Admitting: Radiation Oncology

## 2014-11-09 VITALS — BP 148/97 | HR 96 | Resp 16 | Wt 189.6 lb

## 2014-11-09 DIAGNOSIS — C250 Malignant neoplasm of head of pancreas: Secondary | ICD-10-CM

## 2014-11-09 NOTE — Progress Notes (Signed)
Follow-up note:  Frances [name] returns today approximately 6 weeks following completion of chemoradiation for her T4 N1 adenocarcinoma of the head of the pancreas.  She feels that she is much improved.  Her weight remains stable.  Her abdominal pain is improved although she does have occasional lower abdominal cramping.  She does admit to occasional early satiety.  Her appetite is improved as well.  She recently had x-rays of her lumbar spine, sacrum, and left hip for lateral hip pain radiating to the thigh.  There was no obvious evidence for metastatic disease.  She will see Dr. Benay Spice for a follow-up visit next week.  Physical examination: Alert and oriented.  She looks well. Filed Vitals:   11/09/14 1602  BP: 148/97  Pulse: 96  Resp: 16   Abdomen: Soft, nontender, without masses or organomegaly.  Back: Without spinal or CVA tenderness.  Impression: Clinically improved.  Plan: She'll see Dr. Benay Spice next week for further evaluation and management.  I've not scheduled the patient for a formal follow-up visit, but I would be more than happy to see her in the future should the need arise.

## 2014-11-09 NOTE — Progress Notes (Signed)
Reports that overall her GI symptoms are improving. Reports mild nausea each morning that quickly resolves. Reports abdominal pain associated only with bowel movements. BP slightly elevated. Weight stable.

## 2014-11-15 ENCOUNTER — Other Ambulatory Visit (HOSPITAL_BASED_OUTPATIENT_CLINIC_OR_DEPARTMENT_OTHER): Payer: 59

## 2014-11-15 ENCOUNTER — Telehealth: Payer: Self-pay | Admitting: Nurse Practitioner

## 2014-11-15 ENCOUNTER — Other Ambulatory Visit: Payer: 59

## 2014-11-15 ENCOUNTER — Ambulatory Visit (HOSPITAL_BASED_OUTPATIENT_CLINIC_OR_DEPARTMENT_OTHER): Payer: 59

## 2014-11-15 ENCOUNTER — Ambulatory Visit (HOSPITAL_BASED_OUTPATIENT_CLINIC_OR_DEPARTMENT_OTHER): Payer: 59 | Admitting: Nurse Practitioner

## 2014-11-15 VITALS — BP 157/95 | HR 89 | Temp 98.6°F | Resp 18 | Ht 64.0 in | Wt 186.9 lb

## 2014-11-15 DIAGNOSIS — C250 Malignant neoplasm of head of pancreas: Secondary | ICD-10-CM

## 2014-11-15 DIAGNOSIS — G620 Drug-induced polyneuropathy: Secondary | ICD-10-CM

## 2014-11-15 DIAGNOSIS — M545 Low back pain: Secondary | ICD-10-CM

## 2014-11-15 DIAGNOSIS — Z23 Encounter for immunization: Secondary | ICD-10-CM

## 2014-11-15 DIAGNOSIS — Z95828 Presence of other vascular implants and grafts: Secondary | ICD-10-CM

## 2014-11-15 LAB — BASIC METABOLIC PANEL (CC13)
ANION GAP: 9 meq/L (ref 3–11)
BUN: 5 mg/dL — ABNORMAL LOW (ref 7.0–26.0)
CALCIUM: 8.5 mg/dL (ref 8.4–10.4)
CO2: 24 mEq/L (ref 22–29)
CREATININE: 1.2 mg/dL — AB (ref 0.6–1.1)
Chloride: 107 mEq/L (ref 98–109)
EGFR: 64 mL/min/{1.73_m2} — AB (ref >90)
Glucose: 113 mg/dl (ref 70–140)
Potassium: 3.5 mEq/L (ref 3.5–5.1)
Sodium: 140 mEq/L (ref 136–145)

## 2014-11-15 MED ORDER — SODIUM CHLORIDE 0.9 % IJ SOLN
10.0000 mL | INTRAMUSCULAR | Status: DC | PRN
  Administered 2014-11-15: 10 mL via INTRAVENOUS
  Filled 2014-11-15: qty 10

## 2014-11-15 MED ORDER — HEPARIN SOD (PORK) LOCK FLUSH 100 UNIT/ML IV SOLN
500.0000 [IU] | Freq: Once | INTRAVENOUS | Status: AC
  Administered 2014-11-15: 500 [IU] via INTRAVENOUS
  Filled 2014-11-15: qty 5

## 2014-11-15 NOTE — Patient Instructions (Signed)

## 2014-11-15 NOTE — Telephone Encounter (Signed)
Pt confirmed labs/ov per 12/07 POF, gave pt AVS.... KJ, gave pt barium for CT

## 2014-11-15 NOTE — Telephone Encounter (Signed)
Labs/flush added to 12/07 POF, s/w pt confirming apts for 12/14 ...Marland KitchenMarland KitchenMarland Kitchen KJ

## 2014-11-15 NOTE — Progress Notes (Signed)
[place] OFFICE PROGRESS NOTE   Diagnosis:  Pancreas cancer   INTERVAL HISTORY:   Ms. Cryder returns as scheduled. She continues to have "nagging" low back pain that radiates to the left leg at times. She has mild occasional abdominal pain. She takes Tylenol as needed. No nausea or vomiting. Bowels are moving. She continues to have neuropathy symptoms in the hands and feet with cold exposure.  Objective:  Vital signs in last 24 hours:  Blood pressure 157/95, pulse 89, temperature 98.6 F (37 C), temperature source Oral, resp. rate 18, height 5\' 4"  (1.626 m), weight 186 lb 14.4 oz (84.777 kg), last menstrual period 10/18/2014.    HEENT: no thrush or ulcers. Lymphatics: no palpable cervical or supraclavicular lymph nodes. Resp: lungs clear bilaterally. Cardio: regular rate and rhythm. GI: abdomen is soft. Mild tenderness at the right costal margin. No hepatomegaly. No mass. Vascular: no leg edema. Neuro: motor strength 5 over 5. Knee DTRs 2+, symmetric. Port-A-Cath without erythema.   Lab Results:  Lab Results  Component Value Date   WBC 2.4* 11/01/2014   HGB 9.4* 11/01/2014   HCT 29.9* 11/01/2014   MCV 84.7 11/01/2014   PLT 158 11/01/2014   NEUTROABS 1.6 11/01/2014    Imaging:  No results found.  Medications: I have reviewed the patient's current medications.  Assessment/Plan: 1. 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 Initiation of FOLFIRINOX 10/29/2013. She did not complete the 5-FU pump due to developing altered speech. Cycle 2 completed 11/12/2013. Cycle 3 completed 11/26/2013. Cycle 4 completed 12/09/2013, cycle 5 12/24/2013, cycle 6 01/14/2014, cycle 7 01/28/2014, cycle 8 02/11/2014, cycle 9 02/25/2014, cycle 10 03/11/2014  Restaging CT 01/06/2012 with a slight increase in the size of the pancreas mass and no clear evidence of distant metastatic disease.   CEA improved 01/28/2014.   Cycle 1 of modified FOLFIRI  03/25/2014.   Cycle 2 modified FOLFIRI 04/08/2014.   CEA stable 04/26/2014.   Restaging CT abdomen/pelvis 04/26/2014 with interval decrease in the size of the pancreatic head mass. Stable mesenteric and retroperitoneal lymphadenopathy. Stable splenomegaly. 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 Stable small liver lesions consistent with benign cysts.   Continuation of FOLFIRI on a 3 week schedule.   CEA increased (13.8) on 06/03/2014.   CEA increased (23.3) on 07/08/2014.   Restaging CT evaluation 08/03/2014 with mild interval enlargement of the pancreatic head mass. Slight increase in the size of small central mesenteric lymph nodes. 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 Common bile duct stent in place without evidence of biliary duct dilatation. Stable small hypodensities within the liver.   Initiation of concurrent capecitabine and radiation 08/19/2014, completed 09/24/2014  CEA mildly improved on 10/18/2014 (19.9). 2. Altered speech prior to completion of cycle 1 of FOLFIRINOX, similar symptoms and tongue/lip twitching following the oxaliplatin with cycle 2 FOLFIRINOX. Oxaliplatin was dose reduced and given over 4 hours beginning with cycle 3. She had similar neurologic symptoms after subsequent cycles of FOLFIRINOX. 3. Status post cholecystectomy 10/02/2013 with pathology revealing chronic cholecystitis. 4. Abdomen/back pain secondary to #1. Improved following Xeloda/radiation 5. Hypertension. 6. Status post Port-A-Cath placement 10/28/2013. 7. History of elevated liver enzyme-improved. 8. History of hypokalemia. Taking a potassium supplement. 9. History of neutropenia secondary to chemotherapy. Neulasta was added beginning with cycle 6 of FOLFIRINOX on 01/14/2014. 10. Delayed nausea. Improved with the addition of Aloxi. 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 12. Low back pain  radiating to the left lateral leg status post negative plain x-ray evaluation 11/01/2014.   Disposition: Ms. Maddalena appears unchanged. She continues to have low back pain with a radicular component. We are referring her for restaging CT scans of the abdomen and pelvis and will also obtain CT imaging of the lumbar spine. She will return for a followup visit with Dr. Benay Spice on 11/25/2014 to review the results. She will contact the office in the interim with any problems. We specifically discussed neurologic type symptoms.    Ned Card ANP/GNP-BC   11/15/2014  9:34 AM

## 2014-11-16 ENCOUNTER — Other Ambulatory Visit: Payer: Self-pay | Admitting: Nurse Practitioner

## 2014-11-16 DIAGNOSIS — C250 Malignant neoplasm of head of pancreas: Secondary | ICD-10-CM

## 2014-11-19 ENCOUNTER — Telehealth: Payer: Self-pay | Admitting: Nurse Practitioner

## 2014-11-19 NOTE — Telephone Encounter (Signed)
S/w pt confirming labs/CT Scans/ov per 12/07 POF......Marland Kitchen KJ

## 2014-11-22 ENCOUNTER — Other Ambulatory Visit: Payer: 59

## 2014-11-25 ENCOUNTER — Telehealth: Payer: Self-pay | Admitting: Oncology

## 2014-11-25 ENCOUNTER — Ambulatory Visit (HOSPITAL_BASED_OUTPATIENT_CLINIC_OR_DEPARTMENT_OTHER): Payer: 59 | Admitting: Oncology

## 2014-11-25 ENCOUNTER — Ambulatory Visit
Admission: RE | Admit: 2014-11-25 | Discharge: 2014-11-25 | Disposition: A | Discharge status: Home / Self Care | Payer: 59 | Source: Ambulatory Visit | Attending: Nurse Practitioner | Admitting: Nurse Practitioner

## 2014-11-25 ENCOUNTER — Other Ambulatory Visit: Payer: Self-pay | Admitting: *Deleted

## 2014-11-25 ENCOUNTER — Inpatient Hospital Stay: Admission: RE | Admit: 2014-11-25 | Payer: 59 | Source: Ambulatory Visit

## 2014-11-25 ENCOUNTER — Other Ambulatory Visit (HOSPITAL_BASED_OUTPATIENT_CLINIC_OR_DEPARTMENT_OTHER): Payer: 59

## 2014-11-25 VITALS — BP 150/94 | HR 106 | Temp 98.0°F | Resp 18 | Ht 64.0 in | Wt 184.4 lb

## 2014-11-25 DIAGNOSIS — R944 Abnormal results of kidney function studies: Secondary | ICD-10-CM

## 2014-11-25 DIAGNOSIS — C259 Malignant neoplasm of pancreas, unspecified: Secondary | ICD-10-CM

## 2014-11-25 DIAGNOSIS — M545 Low back pain: Secondary | ICD-10-CM

## 2014-11-25 DIAGNOSIS — C250 Malignant neoplasm of head of pancreas: Secondary | ICD-10-CM

## 2014-11-25 DIAGNOSIS — G620 Drug-induced polyneuropathy: Secondary | ICD-10-CM

## 2014-11-25 LAB — CEA: CEA: 43.7 ng/mL — AB (ref 0.0–5.0)

## 2014-11-25 LAB — BASIC METABOLIC PANEL (CC13)
Anion Gap: 9 mEq/L (ref 3–11)
BUN: 6.8 mg/dL — AB (ref 7.0–26.0)
CALCIUM: 8.8 mg/dL (ref 8.4–10.4)
CO2: 27 mEq/L (ref 22–29)
CREATININE: 1.3 mg/dL — AB (ref 0.6–1.1)
Chloride: 106 mEq/L (ref 98–109)
EGFR: 60 mL/min/{1.73_m2} — AB (ref >90)
Glucose: 90 mg/dl (ref 70–140)
Potassium: 4 mEq/L (ref 3.5–5.1)
Sodium: 142 mEq/L (ref 136–145)

## 2014-11-25 MED ORDER — TRAMADOL HCL 50 MG PO TABS: 50.0000 mg | ORAL_TABLET | Freq: Four times a day (QID) | ORAL | Status: DC | PRN

## 2014-11-25 MED ORDER — IOHEXOL 300 MG/ML  SOLN
100.0000 mL | Freq: Once | INTRAMUSCULAR | Status: AC | PRN
  Administered 2014-11-25: 100 mL via INTRAVENOUS

## 2014-11-25 NOTE — Progress Notes (Signed)
[place] OFFICE PROGRESS NOTE   Diagnosis: Pancreas cancer  INTERVAL HISTORY:   Frances [name] returns as scheduled. She continues to have low back pain. She is not taking pain medication. Good appetite. She is working. No other complaint.  Objective:  Vital signs in last 24 hours:  Blood pressure 150/94, pulse 106, temperature 98 F (36.7 C), temperature source Oral, resp. rate 18, height 5\' 4"  (1.626 m), weight 184 lb 6.4 oz (83.643 kg), last menstrual period 10/18/2014, SpO2 100 %.    HEENT: Neck without mass Lymphatics: No cervical or supraclavicular nodes Resp: Lungs clear bilaterally Cardio: Regular rate and rhythm GI: No hepatomegaly, nontender, no mass Vascular: No leg edema Neuro: The motor exam is intact in the legs and feet bilaterally  Musculoskeletal: 2 cm cutaneous versus abdominal wall mass in the right mid subcostal region   Portacath/PICC-without erythema  Lab Results:  Potassium 4.0, creatinine 1.3, BUN 6.8   Imaging:  Ct Abdomen Pelvis W Contrast  11/25/2014   CLINICAL DATA:  Restaging pancreatic cancer diagnosed 13 months ago. History biliary stent placement, chemotherapy and radiation therapy. Subsequent encounter.  EXAM: CT ABDOMEN AND PELVIS WITH CONTRAST  TECHNIQUE: Multidetector CT imaging of the abdomen and pelvis was performed using the standard protocol following bolus administration of intravenous contrast.  CONTRAST:  166mL OMNIPAQUE IOHEXOL 300 MG/ML  SOLN  COMPARISON:  Prior examinations 08/03/2014 and 04/26/2014.  FINDINGS: Lower chest: Clear lung bases. No significant pleural or pericardial effusion.  Hepatobiliary: There is a new 11 mm low-density lesion in the dome of the right hepatic lobe on image number 8, worrisome for metastatic disease. An adjacent smaller nodule on image number 10 appears new as well. There are several other low-density hepatic lesions which are grossly stable and likely cysts based on stability.  Pneumobilia persists status post metallic biliary stenting. There is some fluid within the stent lumen, but no definite tumor in growth.  Pancreas: There has been progressive enlargement of the mass involving the pancreatic head and body. This now measures approximately 5.5 x 3.7 cm transverse on image 33 and measures approximately 5.6 cm cephalocaudad. Atrophy and ductal dilatation within the pancreatic body and tail are stable.  Spleen: Normal in size without focal abnormality.  Adrenals/Urinary Tract: Both adrenal glands appear normal.The right kidney demonstrates no acute findings. There is a probable mildly complex cyst anteriorly in its interpolar region. There is new left-sided hydronephrosis and hydroureter with delayed contrast excretion and mild cortical thinning. The left ureter is dilated to a retroperitoneal soft tissue nodule measuring 2.5 x 1.6 cm on image 67. The bladder is decompressed without apparent focal abnormality.  Stomach/Bowel: Gastric wall thickening appears mildly progressive, possibly related to interval therapy. There is no significant wall thickening of the small bowel or colon. The appendix appears normal. There is no evidence of bowel obstruction.The patient has developed a moderate amount of ascites. This appears simple within the upper abdomen, although there is a probable large loculated collection of fluid in the false pelvis, superior to the bladder. On the reformatted images, this appears rounded and could reflect a cystic mass of the right ovary. This does have some soft tissue components and measures up to 7.2 x 8.0 cm on sagittal image 68.  Vascular/Lymphatic: Multiple small lymph nodes within the base of the mesentery are similar to the prior study. As above, there is a soft tissue nodule medial to the left iliac vessels which appears to be obstructing the left ureter. No  other enlarged lymph nodes identified. 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  Reproductive: Intrauterine device is noted. The left ovary appears normal. As above, there is a cystic mass superior to the bladder which may be arising from the right ovary or reflect loculated peritoneal fluid.  Other: Stable small umbilical hernia containing fat.  Musculoskeletal: There is grossly stable heterogeneity of the vertebral bodies with relative sclerosis at T11 and L1. No lytic lesion or pathologic fracture demonstrated. There is no evidence of epidural tumor.  IMPRESSION: 1. Progressive disease. The primary mass involving the pancreatic body has enlarged. The biliary stent appears patent. 2. New lesions in the dome of the right hepatic lobe worrisome for metastatic disease. 3. New obstruction of the distal left ureter by a retroperitoneal soft tissue mass, likely malignant adenopathy. 4. New complex fluid superior to the bladder consistent with loculated peritoneal fluid from peritoneal carcinomatosis or a cystic right ovarian lesion. There is a moderate amount of ascites which is new. 5. 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 6.   Electronically Signed   By: Camie Patience M.D.   On: 11/25/2014 12:25    Medications: I have reviewed the patient's current medications.  Assessment/Plan: 1. 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 Initiation of FOLFIRINOX 10/29/2013. She did not complete the 5-FU pump due to developing altered speech. Cycle 2 completed 11/12/2013. Cycle 3 completed 11/26/2013. Cycle 4 completed 12/09/2013, cycle 5 12/24/2013, cycle 6 01/14/2014, cycle 7 01/28/2014, cycle 8 02/11/2014, cycle 9 02/25/2014, cycle 10 03/11/2014  Restaging CT 01/06/2012 with a slight increase in the size of the pancreas mass and no clear evidence of distant metastatic disease.   CEA improved 01/28/2014.   Cycle 1 of modified FOLFIRI 03/25/2014.   Cycle 2 modified FOLFIRI  04/08/2014.   CEA stable 04/26/2014.   Restaging CT abdomen/pelvis 04/26/2014 with interval decrease in the size of the pancreatic head mass. Stable mesenteric and retroperitoneal lymphadenopathy. Stable splenomegaly. 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 Stable small liver lesions consistent with benign cysts.   Continuation of FOLFIRI on a 3 week schedule.   CEA increased (13.8) on 06/03/2014.   CEA increased (23.3) on 07/08/2014.   Restaging CT evaluation 08/03/2014 with mild interval enlargement of the pancreatic head mass. Slight increase in the size of small central mesenteric lymph nodes. 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 Common bile duct stent in place without evidence of biliary duct dilatation. Stable small hypodensities within the liver.   Initiation of concurrent capecitabine and radiation 08/19/2014, completed 09/24/2014  CEA mildly improved on 10/18/2014 (19.9).  Restaging CT 11/25/2014 with an enlarged pancreas body mass, new liver lesions, retroperitoneal soft tissue mass obstructing the left ureter, new ascites, loculated peritoneal fluid superior to the bladder 2. Altered speech prior to completion of cycle 1 of FOLFIRINOX, similar symptoms and tongue/lip twitching following the oxaliplatin with cycle 2 FOLFIRINOX. Oxaliplatin was dose reduced and given over 4 hours beginning with cycle 3. She had similar neurologic symptoms after subsequent cycles of FOLFIRINOX. 3. Status post cholecystectomy 10/02/2013 with pathology revealing chronic cholecystitis. 4. Abdomen/back pain secondary to #1. Improved following Xeloda/radiation 5. Hypertension. 6. Status post Port-A-Cath placement 10/28/2013. 7. History of elevated liver enzyme-improved. 8. History of hypokalemia. Taking a potassium supplement. 9. History of neutropenia secondary to chemotherapy. Neulasta was  added beginning with cycle 6  of FOLFIRINOX on 01/14/2014. 10. Delayed nausea. Improved with the addition of Aloxi. 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 12. Low back pain radiating to the left lateral leg status post negative plain x-ray evaluation 11/01/2014. 13. Left hydronephrosis and hydroureter secondary to a retroperitoneal soft tissue mass on the CT 11/25/2014 14. Mild elevation of the creatinine 11/25/2014   Disposition:  Frances [name] has progressive metastatic pancreas cancer. I reviewed the CT images and discussed treatment options with Frances [name] and her husband. She has previously been treated with FOLFIRINOX and Xeloda/radiation. I recommend treatment with gemcitabine/Abraxane. We reviewed the potential toxicities associated with this regimen including the chance for nausea/vomiting, alopecia, fever, an allergic reaction, rash, pneumonitis, neuropathy, and hematologic toxicity. She agrees to proceed. She will begin gemcitabine/Abraxane 12/08/2014.  The creatinine is mildly elevated. She will push IV fluids over the next few days. I contacted Dr. Risa Grill and he will see Frances [name] 11/29/2014 to discuss placement of a left ureter stent.  The low back discomfort may be related to the primary pancreas tumor, pelvic metastatic disease, or carcinomatosis. No evidence of bone metastases on the CT 11/25/2014. We prescribed tramadol for pain.  She will discontinue potassium as she is no longer taking HCTZ.  Approximately 40 minutes were spent with patient today. The majority of the time was used for counseling and coordination of care.  Betsy Coder, MD  11/25/2014  7:27 PM

## 2014-11-25 NOTE — Telephone Encounter (Signed)
Pt confirmed labs/ov per 12/17 POF, gave pt AVS.... KJ, sent msg to add chemo

## 2014-11-26 ENCOUNTER — Telehealth: Payer: Self-pay | Admitting: *Deleted

## 2014-11-26 NOTE — Telephone Encounter (Signed)
Per staff message and POF I have scheduled appts. Advised scheduler of appts. JMW  

## 2014-11-29 ENCOUNTER — Other Ambulatory Visit: Payer: Self-pay | Admitting: Urology

## 2014-12-01 ENCOUNTER — Encounter (HOSPITAL_BASED_OUTPATIENT_CLINIC_OR_DEPARTMENT_OTHER): Payer: Self-pay | Admitting: *Deleted

## 2014-12-05 ENCOUNTER — Other Ambulatory Visit: Payer: Self-pay | Admitting: Oncology

## 2014-12-06 ENCOUNTER — Encounter (HOSPITAL_BASED_OUTPATIENT_CLINIC_OR_DEPARTMENT_OTHER): Payer: Self-pay | Admitting: *Deleted

## 2014-12-06 NOTE — Progress Notes (Signed)
NPO AFTER MN. ARRIVE AT 1324. CURRENT LAB RESULTS AND EKG IN CHART AND EPIC. WILL TAKE PRILOSEC AM DOS W/ SIPS OF WATER AND IF NEEDED TAKE PAIN RX.

## 2014-12-08 ENCOUNTER — Ambulatory Visit (HOSPITAL_COMMUNITY)
Admission: RE | Admit: 2014-12-08 | Discharge: 2014-12-08 | Disposition: A | Discharge status: Home / Self Care | Payer: 59 | Source: Ambulatory Visit | Attending: Nurse Practitioner | Admitting: Nurse Practitioner

## 2014-12-08 ENCOUNTER — Ambulatory Visit (HOSPITAL_BASED_OUTPATIENT_CLINIC_OR_DEPARTMENT_OTHER): Payer: 59 | Admitting: Nurse Practitioner

## 2014-12-08 ENCOUNTER — Ambulatory Visit (HOSPITAL_BASED_OUTPATIENT_CLINIC_OR_DEPARTMENT_OTHER): Payer: 59

## 2014-12-08 ENCOUNTER — Telehealth: Payer: Self-pay | Admitting: Oncology

## 2014-12-08 ENCOUNTER — Other Ambulatory Visit (HOSPITAL_BASED_OUTPATIENT_CLINIC_OR_DEPARTMENT_OTHER): Payer: 59

## 2014-12-08 ENCOUNTER — Ambulatory Visit: Payer: 59 | Admitting: Nurse Practitioner

## 2014-12-08 ENCOUNTER — Ambulatory Visit: Payer: 59

## 2014-12-08 ENCOUNTER — Telehealth: Payer: Self-pay | Admitting: Nurse Practitioner

## 2014-12-08 ENCOUNTER — Encounter (HOSPITAL_COMMUNITY): Payer: 59

## 2014-12-08 ENCOUNTER — Other Ambulatory Visit: Payer: 59

## 2014-12-08 VITALS — BP 150/95 | HR 83 | Temp 98.0°F | Resp 19 | Ht 64.0 in | Wt 189.0 lb

## 2014-12-08 DIAGNOSIS — M7989 Other specified soft tissue disorders: Secondary | ICD-10-CM

## 2014-12-08 DIAGNOSIS — M545 Low back pain: Secondary | ICD-10-CM

## 2014-12-08 DIAGNOSIS — C250 Malignant neoplasm of head of pancreas: Secondary | ICD-10-CM

## 2014-12-08 DIAGNOSIS — C259 Malignant neoplasm of pancreas, unspecified: Secondary | ICD-10-CM | POA: Insufficient documentation

## 2014-12-08 DIAGNOSIS — Z5111 Encounter for antineoplastic chemotherapy: Secondary | ICD-10-CM

## 2014-12-08 DIAGNOSIS — Z95828 Presence of other vascular implants and grafts: Secondary | ICD-10-CM

## 2014-12-08 DIAGNOSIS — G620 Drug-induced polyneuropathy: Secondary | ICD-10-CM

## 2014-12-08 DIAGNOSIS — R600 Localized edema: Secondary | ICD-10-CM | POA: Insufficient documentation

## 2014-12-08 LAB — COMPREHENSIVE METABOLIC PANEL (CC13)
ALBUMIN: 2.6 g/dL — AB (ref 3.5–5.0)
ALT: 13 U/L (ref 0–55)
AST: 24 U/L (ref 5–34)
Alkaline Phosphatase: 169 U/L — ABNORMAL HIGH (ref 40–150)
Anion Gap: 7 mEq/L (ref 3–11)
BUN: 6.3 mg/dL — ABNORMAL LOW (ref 7.0–26.0)
CHLORIDE: 109 meq/L (ref 98–109)
CO2: 25 mEq/L (ref 22–29)
Calcium: 8.2 mg/dL — ABNORMAL LOW (ref 8.4–10.4)
Creatinine: 1.1 mg/dL (ref 0.6–1.1)
EGFR: 73 mL/min/{1.73_m2} — ABNORMAL LOW (ref >90)
Glucose: 95 mg/dl (ref 70–140)
POTASSIUM: 3.5 meq/L (ref 3.5–5.1)
SODIUM: 142 meq/L (ref 136–145)
TOTAL PROTEIN: 5.5 g/dL — AB (ref 6.4–8.3)
Total Bilirubin: 0.46 mg/dL (ref 0.20–1.20)

## 2014-12-08 LAB — CBC WITH DIFFERENTIAL/PLATELET
BASO%: 0.4 % (ref 0.0–2.0)
Basophils Absolute: 0 10*3/uL (ref 0.0–0.1)
EOS%: 1.9 % (ref 0.0–7.0)
Eosinophils Absolute: 0.1 10*3/uL (ref 0.0–0.5)
HCT: 29.7 % — ABNORMAL LOW (ref 34.8–46.6)
HGB: 9.6 g/dL — ABNORMAL LOW (ref 11.6–15.9)
LYMPH%: 16.6 % (ref 14.0–49.7)
MCH: 27.7 pg (ref 25.1–34.0)
MCHC: 32.3 g/dL (ref 31.5–36.0)
MCV: 85.6 fL (ref 79.5–101.0)
MONO#: 0.3 10*3/uL (ref 0.1–0.9)
MONO%: 10.2 % (ref 0.0–14.0)
NEUT%: 70.9 % (ref 38.4–76.8)
NEUTROS ABS: 1.9 10*3/uL (ref 1.5–6.5)
PLATELETS: 194 10*3/uL (ref 145–400)
RBC: 3.47 10*6/uL — AB (ref 3.70–5.45)
RDW: 15.2 % — AB (ref 11.2–14.5)
WBC: 2.7 10*3/uL — AB (ref 3.9–10.3)
lymph#: 0.4 10*3/uL — ABNORMAL LOW (ref 0.9–3.3)

## 2014-12-08 MED ORDER — SODIUM CHLORIDE 0.9 % IV SOLN
Freq: Once | INTRAVENOUS | Status: AC
  Administered 2014-12-08: 13:00:00 via INTRAVENOUS

## 2014-12-08 MED ORDER — HEPARIN SOD (PORK) LOCK FLUSH 100 UNIT/ML IV SOLN
500.0000 [IU] | Freq: Once | INTRAVENOUS | Status: AC | PRN
  Administered 2014-12-08: 500 [IU]
  Filled 2014-12-08: qty 5

## 2014-12-08 MED ORDER — GEMCITABINE HCL CHEMO INJECTION 1 GM/26.3ML
800.0000 mg/m2 | Freq: Once | INTRAVENOUS | Status: AC
  Administered 2014-12-08: 1558 mg via INTRAVENOUS
  Filled 2014-12-08: qty 40.98

## 2014-12-08 MED ORDER — SODIUM CHLORIDE 0.9 % IJ SOLN
10.0000 mL | INTRAMUSCULAR | Status: DC | PRN
  Administered 2014-12-08: 10 mL
  Filled 2014-12-08: qty 10

## 2014-12-08 MED ORDER — SODIUM CHLORIDE 0.9 % IJ SOLN
10.0000 mL | INTRAMUSCULAR | Status: DC | PRN
  Administered 2014-12-08: 10 mL via INTRAVENOUS
  Filled 2014-12-08: qty 10

## 2014-12-08 MED ORDER — ONDANSETRON 8 MG/NS 50 ML IVPB
INTRAVENOUS | Status: AC
  Filled 2014-12-08: qty 8

## 2014-12-08 MED ORDER — DEXAMETHASONE SODIUM PHOSPHATE 10 MG/ML IJ SOLN
10.0000 mg | Freq: Once | INTRAMUSCULAR | Status: AC
  Administered 2014-12-08: 10 mg via INTRAVENOUS

## 2014-12-08 MED ORDER — PACLITAXEL PROTEIN-BOUND CHEMO INJECTION 100 MG
100.0000 mg/m2 | Freq: Once | INTRAVENOUS | Status: AC
  Administered 2014-12-08: 200 mg via INTRAVENOUS
  Filled 2014-12-08: qty 40

## 2014-12-08 MED ORDER — DEXAMETHASONE SODIUM PHOSPHATE 10 MG/ML IJ SOLN
INTRAMUSCULAR | Status: AC
  Filled 2014-12-08: qty 1

## 2014-12-08 MED ORDER — ONDANSETRON 8 MG/50ML IVPB (CHCC)
8.0000 mg | Freq: Once | INTRAVENOUS | Status: AC
  Administered 2014-12-08: 8 mg via INTRAVENOUS

## 2014-12-08 NOTE — Telephone Encounter (Signed)
gv and printed appt sched and avs fo rpt for Jan 2016....sed added tx. °

## 2014-12-08 NOTE — Patient Instructions (Signed)

## 2014-12-08 NOTE — Progress Notes (Signed)
[facility] OFFICE PROGRESS NOTE   Diagnosis:  Pancreas cancer   INTERVAL HISTORY:   Frances [name] returns as scheduled. She continues to have intermittent low back pain. The pain is relieved with Aleve. She denies nausea/vomiting. She has intermittent constipation. On Christmas Eve she noted that the right leg was swollen. The swelling improved with elevation. A few days later she again noted swelling of the right leg. She denies calf pain. No shortness of breath or chest pain.  Objective:  Vital signs in last 24 hours:  Blood pressure 150/95, pulse 83, temperature 98 F (36.7 C), temperature source Oral, resp. rate 19, height 5\' 4"  (1.626 m), weight 189 lb (85.73 kg), last menstrual period 11/15/2014, SpO2 100 %.    HEENT: No thrush or ulcers. Resp: Lungs clear bilaterally. Cardio: Regular rate and rhythm. GI: Abdomen soft with mild tenderness at the right upper abdomen. No hepatomegaly. No mass. Vascular: The right leg appears larger than the left leg. Prominent varicosities noted right calf region. Right calf is nontender. Port-A-Cath without erythema.   Lab Results:  Lab Results  Component Value Date   WBC 2.7* 12/08/2014   HGB 9.6* 12/08/2014   HCT 29.7* 12/08/2014   MCV 85.6 12/08/2014   PLT 194 12/08/2014   NEUTROABS 1.9 12/08/2014    Imaging:  No results found.  Medications: I have reviewed the patient's current medications.  Assessment/Plan: 1. 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 Initiation of FOLFIRINOX 10/29/2013. She did not complete the 5-FU pump due to developing altered speech. Cycle 2 completed 11/12/2013. Cycle 3 completed 11/26/2013. Cycle 4 completed 12/09/2013, cycle 5 12/24/2013, cycle 6 01/14/2014, cycle 7 01/28/2014, cycle 8 02/11/2014, cycle 9 02/25/2014, cycle 10 03/11/2014  Restaging CT 01/06/2012 with a slight increase in the size of the pancreas mass and no clear evidence of distant metastatic disease.    CEA improved 01/28/2014.   Cycle 1 of modified FOLFIRI 03/25/2014.   Cycle 2 modified FOLFIRI 04/08/2014.   CEA stable 04/26/2014.   Restaging CT abdomen/pelvis 04/26/2014 with interval decrease in the size of the pancreatic head mass. Stable mesenteric and retroperitoneal lymphadenopathy. Stable splenomegaly. 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 Stable small liver lesions consistent with benign cysts.   Continuation of FOLFIRI on a 3 week schedule.   CEA increased (13.8) on 06/03/2014.   CEA increased (23.3) on 07/08/2014.   Restaging CT evaluation 08/03/2014 with mild interval enlargement of the pancreatic head mass. Slight increase in the size of small central mesenteric lymph nodes. 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 Common bile duct stent in place without evidence of biliary duct dilatation. Stable small hypodensities within the liver.   Initiation of concurrent capecitabine and radiation 08/19/2014, completed 09/24/2014  CEA mildly improved on 10/18/2014 (19.9).  Restaging CT 11/25/2014 with an enlarged pancreas body mass, new liver lesions, retroperitoneal soft tissue mass obstructing the left ureter, new ascites, loculated peritoneal fluid superior to the bladder  Initiation of gemcitabine/Abraxane day one/day 8 of a 21 day cycle 12/08/2014. 2. Altered speech prior to completion of cycle 1 of FOLFIRINOX, similar symptoms and tongue/lip twitching following the oxaliplatin with cycle 2 FOLFIRINOX. Oxaliplatin was dose reduced and given over 4 hours beginning with cycle 3. She had similar neurologic symptoms after subsequent cycles of FOLFIRINOX. 3. Status post cholecystectomy 10/02/2013 with pathology revealing chronic cholecystitis. 4. Abdomen/back pain secondary to #1. Improved following Xeloda/radiation 5. Hypertension. 6.  Status post Port-A-Cath placement  10/28/2013. 7. History of elevated liver enzyme-improved. 8. History of hypokalemia.  9. History of neutropenia secondary to chemotherapy. Neulasta was added beginning with cycle 6 of FOLFIRINOX on 01/14/2014. 10. Delayed nausea. Improved with the addition of Aloxi. 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 12. Low back pain radiating to the left lateral leg status post negative plain x-ray evaluation 11/01/2014. 13. Left hydronephrosis and hydroureter secondary to a retroperitoneal soft tissue mass on the CT 11/25/2014.  14. Mild elevation of the creatinine 11/25/2014   Disposition: [name] appears stable. Plan to proceed with cycle 1 day 1 gemcitabine/Abraxane today as scheduled. She will return for the day 8 treatment on 12/15/2013.  She has had recent swelling of the right leg. We are referring her for a venous Doppler today.  She is scheduled for placement of a left ureter stent next week.  She will return for a follow-up visit and cycle 2 day 1 gemcitabine/Abraxane on 12/31/2013.  Plan reviewed with Dr. Benay Spice. 25 minutes were spent face-to-face at today's visit with the majority of that time involved in counseling/coordinaton of care.    Ned Card ANP/GNP-BC   12/08/2014  11:32 AM

## 2014-12-08 NOTE — Progress Notes (Signed)
VASCULAR LAB PRELIMINARY  PRELIMINARY  PRELIMINARY  PRELIMINARY  Right lower extremity venous duplex completed.    Preliminary report:  Right:  No evidence of DVT, superficial thrombosis, or Baker's cyst.  Shakeema Lippman, RVS 12/08/2014, 4:02 PM

## 2014-12-08 NOTE — Telephone Encounter (Signed)
cindy from sch (ECHO) cld to see if I could give pt a message-pt was in trmt room and I went to rerlate message that Jenny Reichmann wanted to adv pt to go to [facility] for  Venous as close to 3 as possible instead of 4 @ [facility]-adv pt-pt stated that was great -nurse stateded that pt would be finish trmt right around 3-cld Cindy back to advcindt stated that was ok

## 2014-12-08 NOTE — Progress Notes (Signed)
Pt d/c'd from Infusion room in stable condition.  She is scheduled for doppler ultrasound of lower extremity.  She and her husband understand to go directly to Radiology Dept at [facility] now for appt..  Instructed her to return to [facility] after [place], if positive for blood clot. as pt will need new Rx for medication and possibly some teaching about injection.  Instructed her to notify Dr. Gearldine Shown nurse when she returns.  She verbalized understanding.

## 2014-12-08 NOTE — Patient Instructions (Signed)
[facility] Discharge Instructions for Patients Receiving Chemotherapy  Today you received the following chemotherapy agents; Abraxene and Gemzar.   To help prevent nausea and vomiting after your treatment, we encourage you to take your nausea medication as directed.    If you develop nausea and vomiting that is not controlled by your nausea medication, call the clinic.   BELOW ARE SYMPTOMS THAT SHOULD BE REPORTED IMMEDIATELY:  *FEVER GREATER THAN 100.5 F  *CHILLS WITH OR WITHOUT FEVER  NAUSEA AND VOMITING THAT IS NOT CONTROLLED WITH YOUR NAUSEA MEDICATION  *UNUSUAL SHORTNESS OF BREATH  *UNUSUAL BRUISING OR BLEEDING  TENDERNESS IN MOUTH AND THROAT WITH OR WITHOUT PRESENCE OF ULCERS  *URINARY PROBLEMS  *BOWEL PROBLEMS  UNUSUAL RASH Items with * indicate a potential emergency and should be followed up as soon as possible.  Feel free to call the clinic you have any questions or concerns. The clinic phone number is [phone].

## 2014-12-09 LAB — CANCER ANTIGEN 19-9: CA 19 9: 5.1 U/mL (ref <35.0)

## 2014-12-09 LAB — CEA: CEA: 59.4 ng/mL — ABNORMAL HIGH (ref 0.0–5.0)

## 2014-12-13 ENCOUNTER — Ambulatory Visit (HOSPITAL_BASED_OUTPATIENT_CLINIC_OR_DEPARTMENT_OTHER)
Admission: RE | Admit: 2014-12-13 | Discharge: 2014-12-13 | Disposition: A | Discharge status: Home / Self Care | Payer: 59 | Source: Ambulatory Visit | Attending: Urology | Admitting: Urology

## 2014-12-13 ENCOUNTER — Ambulatory Visit (HOSPITAL_BASED_OUTPATIENT_CLINIC_OR_DEPARTMENT_OTHER): Payer: 59 | Admitting: Anesthesiology

## 2014-12-13 ENCOUNTER — Encounter (HOSPITAL_BASED_OUTPATIENT_CLINIC_OR_DEPARTMENT_OTHER): Payer: Self-pay | Admitting: Anesthesiology

## 2014-12-13 ENCOUNTER — Encounter (HOSPITAL_BASED_OUTPATIENT_CLINIC_OR_DEPARTMENT_OTHER)
Admission: RE | Disposition: A | Discharge status: Home / Self Care | Payer: Self-pay | Source: Ambulatory Visit | Attending: Urology

## 2014-12-13 DIAGNOSIS — N1330 Unspecified hydronephrosis: Secondary | ICD-10-CM | POA: Diagnosis present

## 2014-12-13 DIAGNOSIS — K219 Gastro-esophageal reflux disease without esophagitis: Secondary | ICD-10-CM | POA: Diagnosis not present

## 2014-12-13 DIAGNOSIS — C259 Malignant neoplasm of pancreas, unspecified: Secondary | ICD-10-CM | POA: Diagnosis not present

## 2014-12-13 DIAGNOSIS — I10 Essential (primary) hypertension: Secondary | ICD-10-CM | POA: Diagnosis not present

## 2014-12-13 DIAGNOSIS — E669 Obesity, unspecified: Secondary | ICD-10-CM | POA: Diagnosis not present

## 2014-12-13 DIAGNOSIS — Z6831 Body mass index (BMI) 31.0-31.9, adult: Secondary | ICD-10-CM | POA: Diagnosis not present

## 2014-12-13 HISTORY — DX: Personal history of other infectious and parasitic diseases: Z86.19

## 2014-12-13 HISTORY — PX: CYSTOSCOPY WITH STENT PLACEMENT: SHX5790

## 2014-12-13 HISTORY — DX: Unspecified hydronephrosis: N13.30

## 2014-12-13 HISTORY — DX: Personal history of other diseases of the digestive system: Z87.19

## 2014-12-13 HISTORY — DX: Encounter for antineoplastic chemotherapy: Z51.11

## 2014-12-13 HISTORY — DX: Malignant neoplasm of head of pancreas: C25.0

## 2014-12-13 HISTORY — PX: CYSTOSCOPY W/ RETROGRADES: SHX1426

## 2014-12-13 SURGERY — CYSTOSCOPY, WITH STENT INSERTION
Anesthesia: General | Site: Ureter | Laterality: Left

## 2014-12-13 MED ORDER — SODIUM CHLORIDE 0.9 % IR SOLN
Status: DC | PRN
  Administered 2014-12-13: 3000 mL

## 2014-12-13 MED ORDER — IOHEXOL 350 MG/ML SOLN
INTRAVENOUS | Status: DC | PRN
  Administered 2014-12-13: 20 mL

## 2014-12-13 MED ORDER — DEXAMETHASONE SODIUM PHOSPHATE 4 MG/ML IJ SOLN
INTRAMUSCULAR | Status: DC | PRN
  Administered 2014-12-13: 10 mg via INTRAVENOUS

## 2014-12-13 MED ORDER — SUCCINYLCHOLINE CHLORIDE 20 MG/ML IJ SOLN
INTRAMUSCULAR | Status: DC | PRN
  Administered 2014-12-13: 100 mg via INTRAVENOUS

## 2014-12-13 MED ORDER — LIDOCAINE HCL (CARDIAC) 20 MG/ML IV SOLN
INTRAVENOUS | Status: DC | PRN
  Administered 2014-12-13: 80 mg via INTRAVENOUS

## 2014-12-13 MED ORDER — PROMETHAZINE HCL 25 MG/ML IJ SOLN
6.2500 mg | INTRAMUSCULAR | Status: DC | PRN
  Filled 2014-12-13: qty 1

## 2014-12-13 MED ORDER — LIDOCAINE HCL 2 % EX GEL
CUTANEOUS | Status: DC | PRN
  Administered 2014-12-13: 1

## 2014-12-13 MED ORDER — FENTANYL CITRATE 0.05 MG/ML IJ SOLN
25.0000 ug | INTRAMUSCULAR | Status: DC | PRN
  Filled 2014-12-13: qty 1

## 2014-12-13 MED ORDER — MIDAZOLAM HCL 2 MG/2ML IJ SOLN
INTRAMUSCULAR | Status: AC
  Filled 2014-12-13: qty 2

## 2014-12-13 MED ORDER — KETOROLAC TROMETHAMINE 30 MG/ML IJ SOLN
30.0000 mg | Freq: Once | INTRAMUSCULAR | Status: DC | PRN
  Filled 2014-12-13: qty 1

## 2014-12-13 MED ORDER — URIBEL 118 MG PO CAPS
1.0000 | ORAL_CAPSULE | Freq: Three times a day (TID) | ORAL | Status: DC | PRN
Start: 2014-12-13 — End: 2015-02-04

## 2014-12-13 MED ORDER — STERILE WATER FOR IRRIGATION IR SOLN
Status: DC | PRN
  Administered 2014-12-13: 3500 mL

## 2014-12-13 MED ORDER — CEFAZOLIN SODIUM-DEXTROSE 2-3 GM-% IV SOLR
INTRAVENOUS | Status: AC
  Filled 2014-12-13: qty 50

## 2014-12-13 MED ORDER — FENTANYL CITRATE 0.05 MG/ML IJ SOLN
INTRAMUSCULAR | Status: AC
  Filled 2014-12-13: qty 4

## 2014-12-13 MED ORDER — PROPOFOL 10 MG/ML IV BOLUS
INTRAVENOUS | Status: DC | PRN
  Administered 2014-12-13: 150 mg via INTRAVENOUS

## 2014-12-13 MED ORDER — FENTANYL CITRATE 0.05 MG/ML IJ SOLN
INTRAMUSCULAR | Status: DC | PRN
  Administered 2014-12-13: 50 ug via INTRAVENOUS

## 2014-12-13 MED ORDER — LACTATED RINGERS IV SOLN
INTRAVENOUS | Status: DC
  Administered 2014-12-13: 10:00:00 via INTRAVENOUS
  Filled 2014-12-13: qty 1000

## 2014-12-13 MED ORDER — ACETAMINOPHEN 10 MG/ML IV SOLN
INTRAVENOUS | Status: DC | PRN
  Administered 2014-12-13: 1000 mg via INTRAVENOUS

## 2014-12-13 MED ORDER — CEFAZOLIN SODIUM-DEXTROSE 2-3 GM-% IV SOLR
2.0000 g | INTRAVENOUS | Status: AC
  Administered 2014-12-13: 2 g via INTRAVENOUS
  Filled 2014-12-13: qty 50

## 2014-12-13 MED ORDER — ONDANSETRON HCL 4 MG/2ML IJ SOLN
INTRAMUSCULAR | Status: DC | PRN
  Administered 2014-12-13: 4 mg via INTRAVENOUS

## 2014-12-13 MED ORDER — LACTATED RINGERS IV SOLN
INTRAVENOUS | Status: DC | PRN
  Administered 2014-12-13: 09:00:00 via INTRAVENOUS

## 2014-12-13 SURGICAL SUPPLY — 18 items
BAG DRAIN URO-CYSTO SKYTR STRL (DRAIN) ×3 IMPLANT
BENZOIN TINCTURE PRP APPL 2/3 (GAUZE/BANDAGES/DRESSINGS) IMPLANT
CANISTER SUCT LVC 12 LTR MEDI- (MISCELLANEOUS) ×3 IMPLANT
CATH INTERMIT  6FR 70CM (CATHETERS) ×3 IMPLANT
CLOTH BEACON ORANGE TIMEOUT ST (SAFETY) ×3 IMPLANT
DRAPE CAMERA CLOSED 9X96 (DRAPES) ×3 IMPLANT
DRSG TEGADERM 2-3/8X2-3/4 SM (GAUZE/BANDAGES/DRESSINGS) IMPLANT
GLOVE BIO SURGEON STRL SZ7.5 (GLOVE) ×3 IMPLANT
GLOVE SURG SS PI 7.5 STRL IVOR (GLOVE) ×6 IMPLANT
GOWN STRL REUS W/ TWL XL LVL3 (GOWN DISPOSABLE) ×2 IMPLANT
GOWN STRL REUS W/TWL XL LVL3 (GOWN DISPOSABLE) ×4
GUIDEWIRE STR DUAL SENSOR (WIRE) ×3 IMPLANT
IV NS IRRIG 3000ML ARTHROMATIC (IV SOLUTION) ×3 IMPLANT
NS IRRIG 500ML POUR BTL (IV SOLUTION) ×3 IMPLANT
PACK CYSTO (CUSTOM PROCEDURE TRAY) ×3 IMPLANT
SHEATH ACCESS URETERAL 24CM (SHEATH) ×3 IMPLANT
STENT URET 6FRX24 CONTOUR (STENTS) ×3 IMPLANT
WATER STERILE IRR 3000ML UROMA (IV SOLUTION) ×3 IMPLANT

## 2014-12-13 NOTE — Discharge Instructions (Addendum)

## 2014-12-13 NOTE — Transfer of Care (Signed)
Immediate Anesthesia Transfer of Care Note  Patient: Frances [name]  Procedure(s) Performed: Procedure(s): CYSTOSCOPY WITH STENT PLACEMENT (Left) CYSTOSCOPY WITH RETROGRADE PYELOGRAM, LEFT URETEROSCOPY (Left)  Patient Location: PACU  Anesthesia Type:General  Level of Consciousness: awake, alert , oriented and patient cooperative  Airway & Oxygen Therapy: Patient Spontanous Breathing and Patient connected to nasal cannula oxygen  Post-op Assessment: Report given to PACU RN and Post -op Vital signs reviewed and stable  Post vital signs: Reviewed and stable  Complications: No apparent anesthesia complications

## 2014-12-13 NOTE — H&P (Signed)
History of Present Illness   Frances [name] presents today as a referral from Dr Julieanne Manson for further assessment and potential treatment of some left-sided hydronephrosis. Frances [name] is currently 49 years of age. For the last year, she has been battling a clinical stage III Adenocarcinoma of the pancreas. Unfortunately, her tumor did not respond well to initial chemotherapy. She is planning to start a different regimen in the very near future. She has had some complaints of nonspecific lumbosacral back discomfort with some radiation down her leg. She has not had any real flank pain per se. No significant voiding complaints. Recent CT imaging reveals some new hydronephrosis. It appeared to be secondary to extrinsic compression secondary to potential adenopathy. I had reviewed her records from [facility], as well as her imaging studies. Her creatinine is relatively stable at 1.3, relative to her baseline numbers.     Past Medical History Problems  1. History of heartburn (Z87.898) 2. History of hypertension (Z86.79) 3. History of malignant neoplasm of pancreas (Z85.07)  Surgical History Problems  1. History of Cesarean Section 2. History of Cholecystectomy 3. History of Choledochal Stent Placement 4. History of Endoscopic Retrograde Cholangiopanc (ERCP) With Biopsy 5. History of Percutaneous Portal Vein Catheter Placement  Current Meds 1. Metoprolol Succinate ER 25 MG Oral Tablet Extended Release 24 Hour;  Therapy: (Recorded:21Dec2015) to Recorded 2. Omeprazole 40 MG Oral Capsule Delayed Release;  Therapy: (Recorded:21Dec2015) to Recorded 3. Tylenol TABS;  Therapy: (Recorded:21Dec2015) to Recorded  Allergies Medication  1. No Known Drug Allergies  Family History Problems  1. Family history of diabetes mellitus (Z83.3) : Maternal Grandfather 2. Family history of hypertension (Z82.49) : Mother 3. No significant family history : Father  Social History Problems    Denied: History  of Alcohol use   Caffeine use (F15.90)   1 qd   Father's age   76yrs   Married   Mother's age   44yrs   Never a smoker   Occupation   Claims rep   One child  Review of Systems Genitourinary, constitutional, skin, eye, otolaryngeal, hematologic/lymphatic, cardiovascular, pulmonary, endocrine, musculoskeletal, gastrointestinal, neurological and psychiatric system(s) were reviewed and pertinent findings if present are noted and are otherwise negative.  Genitourinary: nocturia.  Gastrointestinal: heartburn.  Musculoskeletal: back pain.    Vitals Vital Signs [Data Includes: Last 1 Day]  Recorded: 21Dec2015 10:50AM  Height: 5 ft 4 in Weight: 184 lb  BMI Calculated: 31.58 BSA Calculated: 1.89 Blood Pressure: 146 / 98 Temperature: 98.1 F Heart Rate: 96  Physical Exam Constitutional: Well nourished and well developed . No acute distress.  ENT:. The ears and nose are normal in appearance.  Neck: The appearance of the neck is normal and no neck mass is present.  Cardiovascular: Heart rate and rhythm are normal . No peripheral edema.  Abdomen: The abdomen is soft and nontender. No masses are palpated. No CVA tenderness. No hernias are palpable. No hepatosplenomegaly noted.  Skin: Normal skin turgor, no visible rash and no visible skin lesions.  Neuro/Psych:. Mood and affect are appropriate.    Assessment Assessed  1. Hydronephrosis (N13.30)  Discussion/Summary   Frances Eakins has developed left-sided hydronephrosis, presumptively secondary to extrinsic compression of her ureter from metastatic disease. She is having nonspecific back pain, but I do not think it is likely to be secondary to the hydronephrosis. Because the onset of hydronephrosis, malignancy is usually over a longer time frame. It is most commonly asymptomatic. We discussed several different options. This could include ongoing  observation versus percutaneous nephrostomy tube versus double-J stent. We will go  ahead with double-J stent placement. We will then reassess how well she tolerates the stent, as we certainly do not want to negatively impact her quality of life. Her contralateral kidney is normal and therefore, progressive hydronephrosis on the one side would not be terribly problematic unless she is receiving nephrotoxic chemotherapy. There is n urgency to proceed with stent placement. We did offer her the possibility of trying to do that before the holidays, but she would prefer to wait until after the start of the new year, which I think is reasonable. We will attempt to get her on the schedule for the first week in January. We would anticipate outpatient procedure.      Signatures Electronically signed by : Rana Snare, M.D.; Nov 29 2014 12:20PM EST

## 2014-12-13 NOTE — Op Note (Signed)
Preoperative diagnosis: Left hydronephrosis secondary to metastatic pancreatic cancer Postoperative diagnosis: Same  Procedure: Cystoscopy, left retrograde pyelogram, left ureteroscopy, left double-J stent placement   Surgeon: Bernestine Amass M.D.  Anesthesia: Gen.  Indications: Frances [name] was recently referred by her oncologist for assessment and treatment of left-sided hydronephrosis. She is 49 years of age. She has had a bout oh with advanced adenocarcinoma the pancreas. Recent CT imaging showed new left-sided hydronephrosis secondary to extrinsic compression. She presents now for further assessment of her hydronephrosis.     Technique and findings: Patient was brought the operating room where she had successful induction of general anesthesia. She was placed in lithotomy position and prepped and draped in usual manner. Appropriate surgical timeout was performed. Bladder was unremarkable.   Left retrograde polygrams was done with an open-ended catheter. 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 But it was difficult to say with certainty. There was an abrupt transition point with failure of dye to get beyond the mid ureter. A guidewire initially was unable to be passed but eventually did work its way to the renal pelvis. We then replaced the open-ended catheter to attempt again additional retrograde pyelography. We were able to get a small degree of contrast beyond the obstructed site into a dilated proximal ureter and collecting system.   The open-ended catheter was again removed and the guidewire was left in the left renal pelvis. I wanted to further evaluate whether there might be a duplicated system. A short rigid ureteroscope was used to engage the distal ureter. I was unable to find an obvious branch where there was a duplication. Over the guidewire I placed a 6 French 24 cm double-J stent. It was difficult and there was quite a bit of  resistance at the mid ureter where the obstruction was. This did however appear to be well positioned and did appear to be draining contrast from the left renal pelvis. The patient was brought to recovery room in stable condition.

## 2014-12-13 NOTE — Anesthesia Postprocedure Evaluation (Signed)
  Anesthesia Post-op Note  Patient: Frances [name]  Procedure(s) Performed: Procedure(s) (LRB): CYSTOSCOPY WITH STENT PLACEMENT (Left) CYSTOSCOPY WITH RETROGRADE PYELOGRAM, LEFT URETEROSCOPY (Left)  Patient Location: PACU  Anesthesia Type: General  Level of Consciousness: awake and alert   Airway and Oxygen Therapy: Patient Spontanous Breathing  Post-op Pain: mild  Post-op Assessment: Post-op Vital signs reviewed, Patient's Cardiovascular Status Stable, Respiratory Function Stable, Patent Airway and No signs of Nausea or vomiting  Last Vitals:  Filed Vitals:   12/13/14 1115  BP: 147/89  Pulse: 92  Temp:   Resp: 19    Post-op Vital Signs: stable   Complications: No apparent anesthesia complications

## 2014-12-13 NOTE — Interval H&P Note (Signed)
History and Physical Interval Note:  12/13/2014 10:11 AM  Frances [name]  has presented today for surgery, with the diagnosis of LEFT HYDRONEPHROSIS, METASTATIC PANCREATIC CANCER  The various methods of treatment have been discussed with the patient and family. After consideration of risks, benefits and other options for treatment, the patient has consented to  Procedure(s): CYSTOSCOPY WITH STENT PLACEMENT (Left) CYSTOSCOPY WITH RETROGRADE PYELOGRAM (Left) as a surgical intervention .  The patient's history has been reviewed, patient examined, no change in status, stable for surgery.  I have reviewed the patient's chart and labs.  Questions were answered to the patient's satisfaction.     [name]

## 2014-12-13 NOTE — Anesthesia Preprocedure Evaluation (Addendum)
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 (+) Teeth Intact, Dental Advisory Given   Pulmonary neg pulmonary ROS,  breath sounds clear to auscultation  Pulmonary exam normal       Cardiovascular Exercise Tolerance: Poor hypertension, Pt. on medications Rhythm:Regular Rate:Normal  12-Oct-2014 EKG Normal sinus rhythm Normal ECG  12-08-14 Rt. Venous doppler study (Right leg swelling) Summary: - No evidence of deep vein or superficial thrombosis involving the right lower extremity and left common femoral vein. - No evidence of Baker&'s cyst on the right.   Neuro/Psych Lumbar spine 11-01-14 IMPRESSION: Lower lumbar facet arthrosis with trace anterolisthesis of L4 on L5. No destructive osseous lesions negative neurological ROS  negative psych ROS   GI/Hepatic Neg liver ROS, GERD-  Medicated and Poorly Controlled,  Endo/Other  negative endocrine ROSPancreatic CA, unresectable mass per Dr. Learta Codding Obese  Renal/GU negative Renal ROS  negative genitourinary   Musculoskeletal negative musculoskeletal ROS (+)   Abdominal   Peds negative pediatric ROS (+)  Hematology negative hematology ROS (+) anemia ,   Anesthesia Other Findings   Reproductive/Obstetrics negative OB ROS                      Anesthesia Physical Anesthesia Plan  ASA:  III  Anesthesia Plan: General   Post-op Pain Management:    Induction: Intravenous  Airway Management Planned: LMA and Oral ETT  Additional Equipment:   Intra-op Plan:   Post-operative Plan: Extubation in OR  Informed Consent: I have reviewed the patients History and Physical, chart, labs and discussed the procedure including the risks, benefits and alternatives for the proposed anesthesia with the patient or authorized representative who has indicated his/her understanding and acceptance.   Dental advisory given  Plan Discussed with: CRNA and Surgeon  Anesthesia Plan Comments:        Anesthesia Quick Evaluation

## 2014-12-13 NOTE — Anesthesia Procedure Notes (Signed)
Procedure Name: Intubation Date/Time: 12/13/2014 10:25 AM Performed by: Wanita Chamberlain Pre-anesthesia Checklist: Patient identified, Timeout performed, Emergency Drugs available, Suction available and Patient being monitored Patient Re-evaluated:Patient Re-evaluated prior to inductionOxygen Delivery Method: Circle system utilized Preoxygenation: Pre-oxygenation with 100% oxygen Intubation Type: IV induction Laryngoscope Size: Mac and 3 Grade View: Grade I Tube type: Oral Tube size: 7.0 mm Number of attempts: 1 Airway Equipment and Method: Stylet and Bite block Placement Confirmation: ETT inserted through vocal cords under direct vision,  positive ETCO2 and breath sounds checked- equal and bilateral Secured at: 21 cm Tube secured with: Tape Dental Injury: Teeth and Oropharynx as per pre-operative assessment

## 2014-12-14 ENCOUNTER — Encounter (HOSPITAL_BASED_OUTPATIENT_CLINIC_OR_DEPARTMENT_OTHER): Payer: Self-pay | Admitting: Urology

## 2014-12-16 ENCOUNTER — Other Ambulatory Visit (HOSPITAL_BASED_OUTPATIENT_CLINIC_OR_DEPARTMENT_OTHER): Payer: 59

## 2014-12-16 ENCOUNTER — Ambulatory Visit: Payer: 59

## 2014-12-16 VITALS — BP 162/98 | HR 96 | Temp 98.4°F | Resp 18

## 2014-12-16 DIAGNOSIS — C250 Malignant neoplasm of head of pancreas: Secondary | ICD-10-CM

## 2014-12-16 LAB — CBC WITH DIFFERENTIAL/PLATELET
BASO%: 0.6 % (ref 0.0–2.0)
Basophils Absolute: 0 10*3/uL (ref 0.0–0.1)
EOS ABS: 0 10*3/uL (ref 0.0–0.5)
EOS%: 2.4 % (ref 0.0–7.0)
HEMATOCRIT: 26.5 % — AB (ref 34.8–46.6)
HGB: 8.5 g/dL — ABNORMAL LOW (ref 11.6–15.9)
LYMPH#: 0.4 10*3/uL — AB (ref 0.9–3.3)
LYMPH%: 23 % (ref 14.0–49.7)
MCH: 27.2 pg (ref 25.1–34.0)
MCHC: 32.1 g/dL (ref 31.5–36.0)
MCV: 84.9 fL (ref 79.5–101.0)
MONO#: 0.2 10*3/uL (ref 0.1–0.9)
MONO%: 12.7 % (ref 0.0–14.0)
NEUT%: 61.3 % (ref 38.4–76.8)
NEUTROS ABS: 1 10*3/uL — AB (ref 1.5–6.5)
PLATELETS: 82 10*3/uL — AB (ref 145–400)
RBC: 3.12 10*6/uL — AB (ref 3.70–5.45)
RDW: 14 % (ref 11.2–14.5)
WBC: 1.7 10*3/uL — ABNORMAL LOW (ref 3.9–10.3)

## 2014-12-16 NOTE — Patient Instructions (Signed)
Neutropenia Neutropenia is a condition that occurs when the level of a certain type of white blood cell (neutrophil) in your body becomes lower than normal. Neutrophils are made in the bone marrow and fight infections. These cells protect against bacteria and viruses. The fewer neutrophils you have, and the longer your body remains without them, the greater your risk of getting a severe infection becomes. CAUSES  The cause of neutropenia may be hard to determine. However, it is usually due to 3 main problems:   Decreased production of neutrophils. This may be due to:  Certain medicines such as chemotherapy.  Genetic problems.  Cancer.  Radiation treatments.  Vitamin deficiency.  Some pesticides.  Increased destruction of neutrophils. This may be due to:  Overwhelming infections.  Hemolytic anemia. This is when the body destroys its own blood cells.  Chemotherapy.  Neutrophils moving to areas of the body where they cannot fight infections. This may be due to:  Dialysis procedures.  Conditions where the spleen becomes enlarged. Neutrophils are held in the spleen and are not available to the rest of the body.  Overwhelming infections. The neutrophils are held in the area of the infection and are not available to the rest of the body. SYMPTOMS  There are no specific symptoms of neutropenia. The lack of neutrophils can result in an infection, and an infection can cause various problems. DIAGNOSIS  Diagnosis is made by a blood test. A complete blood count is performed. The normal level of neutrophils in human blood differs with age and race. Infants have lower counts than older children and adults. African Americans have lower counts than Caucasians or Asians. The average adult level is 1500 cells/mm3 of blood. Neutrophil counts are interpreted as follows:  Greater than 1000 cells/mm3 gives normal protection against infection.  500 to 1000 cells/mm3 gives an increased risk for  infection.  200 to 500 cells/mm3 is a greater risk for severe infection.  Lower than 200 cells/mm3 is a marked risk of infection. This may require hospitalization and treatment with antibiotic medicines. TREATMENT  Treatment depends on the underlying cause, severity, and presence of infections or symptoms. It also depends on your health. Your caregiver will discuss the treatment plan with you. Mild cases are often easily treated and have a good outcome. Preventative measures may also be started to limit your risk of infections. Treatment can include:  Taking antibiotics.  Stopping medicines that are known to cause neutropenia.  Correcting nutritional deficiencies by eating green vegetables to supply folic acid and taking vitamin B supplements.  Stopping exposure to pesticides if your neutropenia is related to pesticide exposure.  Taking a blood growth factor called sargramostim, pegfilgrastim, or filgrastim if you are undergoing chemotherapy for cancer. This stimulates white blood cell production.  Removal of the spleen if you have Felty's syndrome and have repeated infections. HOME CARE INSTRUCTIONS   Follow your caregiver's instructions about when you need to have blood work done.  Wash your hands often. Make sure others who come in contact with you also wash their hands.  Wash raw fruits and vegetables before eating them. They can carry bacteria and fungi.  Avoid people with colds or spreadable (contagious) diseases (chickenpox, herpes zoster, influenza).  Avoid large crowds.  Avoid construction areas. The dust can release fungus into the air.  Be cautious around children in daycare or school environments.  Take care of your respiratory system by coughing and deep breathing.  Bathe daily.  Protect your skin from cuts and   burns.  Do not work in the garden or with flowers and plants.  Care for the mouth before and after meals by brushing with a soft toothbrush. If you have  mucositis, do not use mouthwash. Mouthwash contains alcohol and can dry out the mouth even more.  Clean the area between the genitals and the anus (perineal area) after urination and bowel movements. Women need to wipe from front to back.  Use a water soluble lubricant during sexual intercourse and practice good hygiene after. Do not have intercourse if you are severely neutropenic. Check with your caregiver for guidelines.  Exercise daily as tolerated.  Avoid people who were vaccinated with a live vaccine in the past 30 days. You should not receive live vaccines (polio, typhoid).  Do not provide direct care for pets. Avoid animal droppings. Do not clean litter boxes and bird cages.  Do not share food utensils.  Do not use tampons, enemas, or rectal suppositories unless directed by your caregiver.  Use an electric razor to remove hair.  Wash your hands after handling magazines, letters, and newspapers. SEEK IMMEDIATE MEDICAL CARE IF:   You have a fever.  You have chills or start to shake.  You feel nauseous or vomit.  You develop mouth sores.  You develop aches and pains.  You have redness and swelling around open wounds.  Your skin is warm to the touch.  You have pus coming from your wounds.  You develop swollen lymph nodes.  You feel weak or fatigued.  You develop red streaks on the skin. MAKE SURE YOU:  Understand these instructions.  Will watch your condition.  Will get help right away if you are not doing well or get worse. Document Released: 05/18/2002 Document Revised: 02/18/2012 Document Reviewed: 06/15/2011 Exit[employer] Patient Information 2015 [employer]. This information is not intended to replace advice given to you by your health care provider. Make sure you discuss any questions you have with your health care provider.  

## 2014-12-16 NOTE — Progress Notes (Signed)
ANC 1.0, plt 82 today for cycle 1 day 8; hold chemo per Dr. Benay Spice. Labs/chemo rescheduled for 1 week per MD, patient requesting afternoon on Friday apt. POF entered, Sharyn Lull to prepare schedule for pt while in infusion. Patient educated on infection control and bleeding precautions. She recently had uretal stent placed on 12/13/14, reporting pink-tinged urine. Patient instructed to report immediately any changes in urine color or presence of bright red blood. Chemo alert card given, instructed on how to use. Patient verbalizes understanding of the above. New schedule given. Patient discharged home in stable condition.

## 2014-12-24 ENCOUNTER — Other Ambulatory Visit (HOSPITAL_BASED_OUTPATIENT_CLINIC_OR_DEPARTMENT_OTHER): Payer: 59

## 2014-12-24 ENCOUNTER — Other Ambulatory Visit: Payer: Self-pay | Admitting: Oncology

## 2014-12-24 ENCOUNTER — Ambulatory Visit (HOSPITAL_BASED_OUTPATIENT_CLINIC_OR_DEPARTMENT_OTHER): Payer: 59

## 2014-12-24 DIAGNOSIS — C250 Malignant neoplasm of head of pancreas: Secondary | ICD-10-CM

## 2014-12-24 DIAGNOSIS — Z5111 Encounter for antineoplastic chemotherapy: Secondary | ICD-10-CM

## 2014-12-24 LAB — COMPREHENSIVE METABOLIC PANEL (CC13)
ALK PHOS: 304 U/L — AB (ref 40–150)
ALT: 21 U/L (ref 0–55)
ANION GAP: 9 meq/L (ref 3–11)
AST: 45 U/L — ABNORMAL HIGH (ref 5–34)
Albumin: 2.7 g/dL — ABNORMAL LOW (ref 3.5–5.0)
BILIRUBIN TOTAL: 0.37 mg/dL (ref 0.20–1.20)
BUN: 7.4 mg/dL (ref 7.0–26.0)
CALCIUM: 8.3 mg/dL — AB (ref 8.4–10.4)
CO2: 25 mEq/L (ref 22–29)
Chloride: 106 mEq/L (ref 98–109)
Creatinine: 1.2 mg/dL — ABNORMAL HIGH (ref 0.6–1.1)
EGFR: 63 mL/min/{1.73_m2} — AB (ref >90)
GLUCOSE: 119 mg/dL (ref 70–140)
POTASSIUM: 4.2 meq/L (ref 3.5–5.1)
SODIUM: 141 meq/L (ref 136–145)
Total Protein: 5.8 g/dL — ABNORMAL LOW (ref 6.4–8.3)

## 2014-12-24 LAB — CBC WITH DIFFERENTIAL/PLATELET
BASO%: 0.3 % (ref 0.0–2.0)
Basophils Absolute: 0 10*3/uL (ref 0.0–0.1)
EOS ABS: 0.1 10*3/uL (ref 0.0–0.5)
EOS%: 2 % (ref 0.0–7.0)
HCT: 29.3 % — ABNORMAL LOW (ref 34.8–46.6)
HGB: 9.3 g/dL — ABNORMAL LOW (ref 11.6–15.9)
LYMPH#: 0.3 10*3/uL — AB (ref 0.9–3.3)
LYMPH%: 7.8 % — AB (ref 14.0–49.7)
MCH: 27.2 pg (ref 25.1–34.0)
MCHC: 31.7 g/dL (ref 31.5–36.0)
MCV: 85.7 fL (ref 79.5–101.0)
MONO#: 0.5 10*3/uL (ref 0.1–0.9)
MONO%: 13.8 % (ref 0.0–14.0)
NEUT#: 2.6 10*3/uL (ref 1.5–6.5)
NEUT%: 76.1 % (ref 38.4–76.8)
PLATELETS: 357 10*3/uL (ref 145–400)
RBC: 3.42 10*6/uL — ABNORMAL LOW (ref 3.70–5.45)
RDW: 14.6 % — AB (ref 11.2–14.5)
WBC: 3.5 10*3/uL — AB (ref 3.9–10.3)

## 2014-12-24 MED ORDER — HEPARIN SOD (PORK) LOCK FLUSH 100 UNIT/ML IV SOLN
500.0000 [IU] | Freq: Once | INTRAVENOUS | Status: AC | PRN
  Administered 2014-12-24: 500 [IU]
  Filled 2014-12-24: qty 5

## 2014-12-24 MED ORDER — DEXAMETHASONE SODIUM PHOSPHATE 10 MG/ML IJ SOLN
10.0000 mg | Freq: Once | INTRAMUSCULAR | Status: AC
  Administered 2014-12-24: 10 mg via INTRAVENOUS

## 2014-12-24 MED ORDER — SODIUM CHLORIDE 0.9 % IV SOLN
Freq: Once | INTRAVENOUS | Status: AC
  Administered 2014-12-24: 12:00:00 via INTRAVENOUS

## 2014-12-24 MED ORDER — SODIUM CHLORIDE 0.9 % IJ SOLN
10.0000 mL | INTRAMUSCULAR | Status: DC | PRN
  Administered 2014-12-24: 10 mL
  Filled 2014-12-24: qty 10

## 2014-12-24 MED ORDER — DEXAMETHASONE SODIUM PHOSPHATE 10 MG/ML IJ SOLN
INTRAMUSCULAR | Status: AC
Start: 2014-12-24 — End: 2014-12-24
  Filled 2014-12-24: qty 1

## 2014-12-24 MED ORDER — ONDANSETRON 8 MG/NS 50 ML IVPB
INTRAVENOUS | Status: AC
  Filled 2014-12-24: qty 8

## 2014-12-24 MED ORDER — PACLITAXEL PROTEIN-BOUND CHEMO INJECTION 100 MG
100.0000 mg/m2 | Freq: Once | INTRAVENOUS | Status: AC
  Administered 2014-12-24: 200 mg via INTRAVENOUS
  Filled 2014-12-24: qty 40

## 2014-12-24 MED ORDER — SODIUM CHLORIDE 0.9 % IV SOLN
800.0000 mg/m2 | Freq: Once | INTRAVENOUS | Status: AC
  Administered 2014-12-24: 1558 mg via INTRAVENOUS
  Filled 2014-12-24: qty 40.98

## 2014-12-24 MED ORDER — ONDANSETRON 8 MG/50ML IVPB (CHCC)
8.0000 mg | Freq: Once | INTRAVENOUS | Status: AC
  Administered 2014-12-24: 8 mg via INTRAVENOUS

## 2014-12-24 NOTE — Patient Instructions (Signed)
[facility] Discharge Instructions for Patients Receiving Chemotherapy  Today you received the following chemotherapy agents Abraxane/Gemzar.  To help prevent nausea and vomiting after your treatment, we encourage you to take your nausea medication as directed.    If you develop nausea and vomiting that is not controlled by your nausea medication, call the clinic.   BELOW ARE SYMPTOMS THAT SHOULD BE REPORTED IMMEDIATELY:  *FEVER GREATER THAN 100.5 F  *CHILLS WITH OR WITHOUT FEVER  NAUSEA AND VOMITING THAT IS NOT CONTROLLED WITH YOUR NAUSEA MEDICATION  *UNUSUAL SHORTNESS OF BREATH  *UNUSUAL BRUISING OR BLEEDING  TENDERNESS IN MOUTH AND THROAT WITH OR WITHOUT PRESENCE OF ULCERS  *URINARY PROBLEMS  *BOWEL PROBLEMS  UNUSUAL RASH Items with * indicate a potential emergency and should be followed up as soon as possible.  Feel free to call the clinic you have any questions or concerns. The clinic phone number is [phone].

## 2014-12-24 NOTE — Progress Notes (Signed)
Alkaline Phosphatase: 304. Dr. Benay Spice notified, ok to proceed with treatment.

## 2014-12-26 ENCOUNTER — Other Ambulatory Visit: Payer: Self-pay | Admitting: Oncology

## 2014-12-31 ENCOUNTER — Other Ambulatory Visit: Payer: Self-pay | Admitting: Medical Oncology

## 2014-12-31 ENCOUNTER — Other Ambulatory Visit: Payer: Self-pay | Admitting: *Deleted

## 2014-12-31 ENCOUNTER — Telehealth: Payer: Self-pay | Admitting: Nurse Practitioner

## 2014-12-31 ENCOUNTER — Ambulatory Visit: Payer: 59

## 2014-12-31 ENCOUNTER — Other Ambulatory Visit (HOSPITAL_BASED_OUTPATIENT_CLINIC_OR_DEPARTMENT_OTHER): Payer: 59

## 2014-12-31 ENCOUNTER — Ambulatory Visit (HOSPITAL_BASED_OUTPATIENT_CLINIC_OR_DEPARTMENT_OTHER): Payer: 59 | Admitting: Nurse Practitioner

## 2014-12-31 ENCOUNTER — Telehealth: Payer: Self-pay | Admitting: Medical Oncology

## 2014-12-31 ENCOUNTER — Telehealth: Payer: Self-pay | Admitting: *Deleted

## 2014-12-31 VITALS — BP 133/92 | HR 110 | Temp 98.4°F | Resp 19 | Ht 64.0 in | Wt 176.0 lb

## 2014-12-31 DIAGNOSIS — D709 Neutropenia, unspecified: Secondary | ICD-10-CM

## 2014-12-31 DIAGNOSIS — D701 Agranulocytosis secondary to cancer chemotherapy: Secondary | ICD-10-CM

## 2014-12-31 DIAGNOSIS — C250 Malignant neoplasm of head of pancreas: Secondary | ICD-10-CM

## 2014-12-31 DIAGNOSIS — K868 Other specified diseases of pancreas: Secondary | ICD-10-CM

## 2014-12-31 DIAGNOSIS — G620 Drug-induced polyneuropathy: Secondary | ICD-10-CM

## 2014-12-31 LAB — CBC WITH DIFFERENTIAL/PLATELET
BASO%: 2.9 % — ABNORMAL HIGH (ref 0.0–2.0)
BASOS ABS: 0 10*3/uL (ref 0.0–0.1)
EOS ABS: 0 10*3/uL (ref 0.0–0.5)
EOS%: 2.9 % (ref 0.0–7.0)
HCT: 26.1 % — ABNORMAL LOW (ref 34.8–46.6)
HEMOGLOBIN: 8.4 g/dL — AB (ref 11.6–15.9)
LYMPH%: 44.3 % (ref 14.0–49.7)
MCH: 27.2 pg (ref 25.1–34.0)
MCHC: 32.2 g/dL (ref 31.5–36.0)
MCV: 84.5 fL (ref 79.5–101.0)
MONO#: 0.1 10*3/uL (ref 0.1–0.9)
MONO%: 10 % (ref 0.0–14.0)
NEUT%: 39.9 % (ref 38.4–76.8)
NEUTROS ABS: 0.3 10*3/uL — AB (ref 1.5–6.5)
NRBC: 0 % (ref 0–0)
PLATELETS: 200 10*3/uL (ref 145–400)
RBC: 3.09 10*6/uL — AB (ref 3.70–5.45)
RDW: 14.5 % (ref 11.2–14.5)
WBC: 0.7 10*3/uL — CL (ref 3.9–10.3)
lymph#: 0.3 10*3/uL — ABNORMAL LOW (ref 0.9–3.3)

## 2014-12-31 MED ORDER — LIDOCAINE-PRILOCAINE 2.5-2.5 % EX CREA: 1.0000 "application " | TOPICAL_CREAM | CUTANEOUS | Status: DC | PRN

## 2014-12-31 MED ORDER — POTASSIUM CHLORIDE CRYS ER 20 MEQ PO TBCR: 20.0000 meq | EXTENDED_RELEASE_TABLET | Freq: Two times a day (BID) | ORAL | Status: DC

## 2014-12-31 MED ORDER — CIPROFLOXACIN HCL 500 MG PO TABS: 500.0000 mg | ORAL_TABLET | Freq: Two times a day (BID) | ORAL | Status: DC

## 2014-12-31 MED ORDER — PANCRELIPASE (LIP-PROT-AMYL) 12000-38000 UNITS PO CPEP: 36000.0000 [IU] | ORAL_CAPSULE | Freq: Three times a day (TID) | ORAL | Status: AC

## 2014-12-31 MED ORDER — LIDOCAINE-PRILOCAINE 2.5-2.5 % EX CREA: 1.0000 "application " | TOPICAL_CREAM | CUTANEOUS | Status: AC | PRN

## 2014-12-31 MED ORDER — PANCRELIPASE (LIP-PROT-AMYL) 12000-38000 UNITS PO CPEP: 36000.0000 [IU] | ORAL_CAPSULE | Freq: Three times a day (TID) | ORAL | Status: DC

## 2014-12-31 NOTE — Telephone Encounter (Signed)
Per staff message and POF I have scheduled appts. Advised scheduler of appts. JMW  

## 2014-12-31 NOTE — Progress Notes (Addendum)
[place] OFFICE PROGRESS NOTE   Diagnosis:  Pancreas cancer  INTERVAL HISTORY:   Ms. Hovatter returns as scheduled. She began gemcitabine/Abraxane on a day one/day 8 schedule of a 21 day cycle on 12/08/2014. The day 8 treatment was held due to neutropenia and thrombocytopenia. She was last treated on 12/24/2014.  She denies nausea/vomiting. No mouth sores. She has lost some weight over the past 3 weeks. She notes multiple loose stools after eating a meal. She and her husband feel the weight loss is related to the change in bowel habits. She has mild dysuria which she relates to the recent stent placement. No rash. No fever or chills. No shortness breath. No cough. She has stable mild numbness in the feet.  Objective:  Vital signs in last 24 hours:  Blood pressure 133/92, pulse 110, temperature 98.4 F (36.9 C), temperature source Oral, resp. rate 19, height 5\' 4"  (1.626 m), weight 176 lb (79.833 kg), SpO2 100 %. repeat heart rate 92    HEENT: No thrush or ulcers. Resp: Lungs clear bilaterally. Cardio: Regular rate and rhythm. GI: Abdomen soft and nontender. No organomegaly. No mass. Vascular: No leg edema. Right lower leg is slightly larger than the left lower leg. Calves soft and nontender.  Port-A-Cath without erythema.    Lab Results:  Lab Results  Component Value Date   WBC 0.7* 12/31/2014   HGB 8.4* 12/31/2014   HCT 26.1* 12/31/2014   MCV 84.5 12/31/2014   PLT 200 12/31/2014   NEUTROABS 0.3* 12/31/2014    Imaging:  No results found.  Medications: I have reviewed the patient's current medications.  Assessment/Plan: 1. 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 Initiation of FOLFIRINOX 10/29/2013. She did not complete the 5-FU pump due to developing altered speech. Cycle 2 completed 11/12/2013. Cycle 3 completed 11/26/2013. Cycle 4 completed 12/09/2013, cycle 5 12/24/2013, cycle 6 01/14/2014, cycle 7 01/28/2014, cycle 8 02/11/2014,  cycle 9 02/25/2014, cycle 10 03/11/2014  Restaging CT 01/06/2012 with a slight increase in the size of the pancreas mass and no clear evidence of distant metastatic disease.   CEA improved 01/28/2014.   Cycle 1 of modified FOLFIRI 03/25/2014.   Cycle 2 modified FOLFIRI 04/08/2014.   CEA stable 04/26/2014.   Restaging CT abdomen/pelvis 04/26/2014 with interval decrease in the size of the pancreatic head mass. Stable mesenteric and retroperitoneal lymphadenopathy. Stable splenomegaly. 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 Stable small liver lesions consistent with benign cysts.   Continuation of FOLFIRI on a 3 week schedule.   CEA increased (13.8) on 06/03/2014.   CEA increased (23.3) on 07/08/2014.   Restaging CT evaluation 08/03/2014 with mild interval enlargement of the pancreatic head mass. Slight increase in the size of small central mesenteric lymph nodes. 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 Common bile duct stent in place without evidence of biliary duct dilatation. Stable small hypodensities within the liver.   Initiation of concurrent capecitabine and radiation 08/19/2014, completed 09/24/2014  CEA mildly improved on 10/18/2014 (19.9).  Restaging CT 11/25/2014 with an enlarged pancreas body mass, new liver lesions, retroperitoneal soft tissue mass obstructing the left ureter, new ascites, loculated peritoneal fluid superior to the bladder  Initiation of gemcitabine/Abraxane day one/day 8 of a 21 day cycle 12/08/2014.  Day 8 treatment held on 12/16/2014 due to neutropenia and thrombocytopenia.  Cycle 2 gemcitabine/Abraxane day 1 12/24/2014.  Cycle 2 day 8 held on 12/31/2014 due  to neutropenia.  Gemcitabine/Abraxane schedule adjusted to every 2 weeks beginning 01/07/2015. 2. Altered speech prior to completion of cycle 1 of FOLFIRINOX, similar symptoms and  tongue/lip twitching following the oxaliplatin with cycle 2 FOLFIRINOX. Oxaliplatin was dose reduced and given over 4 hours beginning with cycle 3. She had similar neurologic symptoms after subsequent cycles of FOLFIRINOX. 3. Status post cholecystectomy 10/02/2013 with pathology revealing chronic cholecystitis. 4. Abdomen/back pain secondary to #1. Improved following Xeloda/radiation 5. Hypertension. 6. Status post Port-A-Cath placement 10/28/2013. 7. History of elevated liver enzyme-improved. 8. History of hypokalemia.  9. History of neutropenia secondary to chemotherapy. Neulasta was added beginning with cycle 6 of FOLFIRINOX on 01/14/2014. 10. Delayed nausea. Improved with the addition of Aloxi. 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 12. Low back pain radiating to the left lateral leg status post negative plain x-ray evaluation 11/01/2014. 13. Left hydronephrosis and hydroureter secondary to a retroperitoneal soft tissue mass on the CT 11/25/2014.   Status post left double-J stent placement 12/13/2014 by Dr. Risa Grill. 14. Mild elevation of the creatinine 11/25/2014. Stable 12/24/2014. 15. Loose stools after meals. Question pancreatic insufficiency. Pancreatic enzyme replacement initiated 12/31/2014. 16. Weight loss. Question if related to #15. Continue to monitor. 17. Neutropenia related to chemotherapy 12/31/2014.   Disposition: Ms. Welcome appears stable. She has completed 2 treatments with gemcitabine/Abraxane. The day 8 treatments have been held due to myelosuppression.   She is significantly neutropenic today. We are holding today's treatment and rescheduling for one week. Schedule thereafter will be adjusted to every 2 weeks. She will begin ciprofloxacin 500 mg twice daily. She understands to contact the office with fever, chills, other signs of infection.  She is having loose stools after eating. This may be related to pancreatic insufficiency. She will begin pancreatic enzyme  replacement.  She will return for gemcitabine/Abraxane on 01/07/2015. We will see her in follow-up prior to treatment on 01/21/2015. She will contact the office in the interim as outlined above or with any other problems.  Patient seen with Dr. Benay Spice. 25 minutes were spent face-to-face at today's visit with the majority of that time involved in counseling/coordination of care.    Ned Card ANP/GNP-BC   12/31/2014  10:15 AM  This was a shared visit with Ned Card. Chemotherapy will be held secondary to neutropenia. We will change to an every 2 week gemcitabine/Abraxane schedule. Pancreatic enzyme replacement will be added in an attempt to improve the frequent loose stools.  Julieanne Manson, M.D.

## 2014-12-31 NOTE — Telephone Encounter (Signed)
Pt confirmed labs/ov per  01/22 POF, gave pt AVS...Marland KitchenMarland KitchenCherylann Banas, sent msg to add chemo and r/s today's chemo visit

## 2014-12-31 NOTE — Telephone Encounter (Signed)
[facility]

## 2014-12-31 NOTE — Progress Notes (Signed)
Cancelled rx , cipro , emla, kdur,creon [facility]-faxed request to [facility]  , phone line busy.

## 2015-01-07 ENCOUNTER — Ambulatory Visit (HOSPITAL_BASED_OUTPATIENT_CLINIC_OR_DEPARTMENT_OTHER): Payer: 59

## 2015-01-07 ENCOUNTER — Other Ambulatory Visit (HOSPITAL_BASED_OUTPATIENT_CLINIC_OR_DEPARTMENT_OTHER): Payer: 59

## 2015-01-07 DIAGNOSIS — Z5111 Encounter for antineoplastic chemotherapy: Secondary | ICD-10-CM

## 2015-01-07 DIAGNOSIS — C250 Malignant neoplasm of head of pancreas: Secondary | ICD-10-CM

## 2015-01-07 LAB — COMPREHENSIVE METABOLIC PANEL (CC13)
ALT: 73 U/L — ABNORMAL HIGH (ref 0–55)
ANION GAP: 9 meq/L (ref 3–11)
AST: 142 U/L — AB (ref 5–34)
Albumin: 2.6 g/dL — ABNORMAL LOW (ref 3.5–5.0)
Alkaline Phosphatase: 444 U/L — ABNORMAL HIGH (ref 40–150)
BUN: 6.7 mg/dL — AB (ref 7.0–26.0)
CALCIUM: 7.8 mg/dL — AB (ref 8.4–10.4)
CHLORIDE: 107 meq/L (ref 98–109)
CO2: 25 mEq/L (ref 22–29)
Creatinine: 1.1 mg/dL (ref 0.6–1.1)
EGFR: 70 mL/min/{1.73_m2} — ABNORMAL LOW (ref >90)
Glucose: 114 mg/dl (ref 70–140)
Potassium: 3.5 mEq/L (ref 3.5–5.1)
Sodium: 142 mEq/L (ref 136–145)
Total Bilirubin: 0.36 mg/dL (ref 0.20–1.20)
Total Protein: 5.6 g/dL — ABNORMAL LOW (ref 6.4–8.3)

## 2015-01-07 LAB — CBC WITH DIFFERENTIAL/PLATELET
BASO%: 0.7 % (ref 0.0–2.0)
Basophils Absolute: 0 10*3/uL (ref 0.0–0.1)
EOS%: 2.6 % (ref 0.0–7.0)
Eosinophils Absolute: 0.1 10*3/uL (ref 0.0–0.5)
HEMATOCRIT: 26.8 % — AB (ref 34.8–46.6)
HEMOGLOBIN: 8.4 g/dL — AB (ref 11.6–15.9)
LYMPH%: 7.8 % — ABNORMAL LOW (ref 14.0–49.7)
MCH: 27.1 pg (ref 25.1–34.0)
MCHC: 31.4 g/dL — ABNORMAL LOW (ref 31.5–36.0)
MCV: 86.4 fL (ref 79.5–101.0)
MONO#: 0.3 10*3/uL (ref 0.1–0.9)
MONO%: 11.5 % (ref 0.0–14.0)
NEUT#: 1.8 10*3/uL (ref 1.5–6.5)
NEUT%: 77.4 % — ABNORMAL HIGH (ref 38.4–76.8)
Platelets: 142 10*3/uL — ABNORMAL LOW (ref 145–400)
RBC: 3.11 10*6/uL — ABNORMAL LOW (ref 3.70–5.45)
RDW: 15.8 % — ABNORMAL HIGH (ref 11.2–14.5)
WBC: 2.4 10*3/uL — ABNORMAL LOW (ref 3.9–10.3)
lymph#: 0.2 10*3/uL — ABNORMAL LOW (ref 0.9–3.3)

## 2015-01-07 MED ORDER — SODIUM CHLORIDE 0.9 % IJ SOLN
10.0000 mL | INTRAMUSCULAR | Status: DC | PRN
  Administered 2015-01-07: 10 mL
  Filled 2015-01-07: qty 10

## 2015-01-07 MED ORDER — SODIUM CHLORIDE 0.9 % IV SOLN
Freq: Once | INTRAVENOUS | Status: AC
  Administered 2015-01-07: 14:00:00 via INTRAVENOUS

## 2015-01-07 MED ORDER — ONDANSETRON 8 MG/50ML IVPB (CHCC)
8.0000 mg | Freq: Once | INTRAVENOUS | Status: AC
  Administered 2015-01-07: 8 mg via INTRAVENOUS

## 2015-01-07 MED ORDER — ONDANSETRON 8 MG/NS 50 ML IVPB
INTRAVENOUS | Status: AC
  Filled 2015-01-07: qty 8

## 2015-01-07 MED ORDER — GEMCITABINE HCL CHEMO INJECTION 1 GM/26.3ML
800.0000 mg/m2 | Freq: Once | INTRAVENOUS | Status: AC
  Administered 2015-01-07: 1558 mg via INTRAVENOUS
  Filled 2015-01-07: qty 40.98

## 2015-01-07 MED ORDER — HEPARIN SOD (PORK) LOCK FLUSH 100 UNIT/ML IV SOLN
500.0000 [IU] | Freq: Once | INTRAVENOUS | Status: AC | PRN
  Administered 2015-01-07: 500 [IU]
  Filled 2015-01-07: qty 5

## 2015-01-07 MED ORDER — PACLITAXEL PROTEIN-BOUND CHEMO INJECTION 100 MG
100.0000 mg/m2 | Freq: Once | INTRAVENOUS | Status: AC
  Administered 2015-01-07: 200 mg via INTRAVENOUS
  Filled 2015-01-07: qty 40

## 2015-01-07 MED ORDER — DEXAMETHASONE SODIUM PHOSPHATE 10 MG/ML IJ SOLN
INTRAMUSCULAR | Status: AC
  Filled 2015-01-07: qty 1

## 2015-01-07 MED ORDER — DEXAMETHASONE SODIUM PHOSPHATE 10 MG/ML IJ SOLN
10.0000 mg | Freq: Once | INTRAMUSCULAR | Status: AC
  Administered 2015-01-07: 10 mg via INTRAVENOUS

## 2015-01-07 NOTE — Patient Instructions (Signed)
[facility] Discharge Instructions for Patients Receiving Chemotherapy  Today you received the following chemotherapy agents: Abraxane and Gemzar.  To help prevent nausea and vomiting after your treatment, we encourage you to take your nausea medication as prescribed.   If you develop nausea and vomiting that is not controlled by your nausea medication, call the clinic.   BELOW ARE SYMPTOMS THAT SHOULD BE REPORTED IMMEDIATELY:  *FEVER GREATER THAN 100.5 F  *CHILLS WITH OR WITHOUT FEVER  NAUSEA AND VOMITING THAT IS NOT CONTROLLED WITH YOUR NAUSEA MEDICATION  *UNUSUAL SHORTNESS OF BREATH  *UNUSUAL BRUISING OR BLEEDING  TENDERNESS IN MOUTH AND THROAT WITH OR WITHOUT PRESENCE OF ULCERS  *URINARY PROBLEMS  *BOWEL PROBLEMS  UNUSUAL RASH Items with * indicate a potential emergency and should be followed up as soon as possible.  Feel free to call the clinic you have any questions or concerns. The clinic phone number is [phone].

## 2015-01-07 NOTE — Progress Notes (Signed)
Dr. Benay Spice reviewed all lab results today 01/07/15; okay to proceed with treatment.

## 2015-01-07 NOTE — Progress Notes (Signed)
Abraxane was administered at 1458 and completed at 1528.  It was double verified and witnessed by 2 RNs, however it was pended on the [facility].  Verified again by Jonelle Sports and Wendall Stade to allow for completion of the transfusion and to complete the day.

## 2015-01-19 ENCOUNTER — Encounter: Payer: Self-pay | Admitting: Nurse Practitioner

## 2015-01-19 ENCOUNTER — Telehealth: Payer: Self-pay | Admitting: Oncology

## 2015-01-19 ENCOUNTER — Encounter: Payer: Self-pay | Admitting: Oncology

## 2015-01-19 NOTE — Progress Notes (Signed)
I never received forms for patient, informed her I will check with the pod to see if they had them, called and left vm asking nurse.

## 2015-01-19 NOTE — Telephone Encounter (Signed)
Frances [name],  patient reported giving paperwork related to her job to [employer].  She would like to check the status of the paperwork.  Her contact information is [phone].  Please follow up with patient.

## 2015-01-21 ENCOUNTER — Telehealth: Payer: Self-pay | Admitting: *Deleted

## 2015-01-21 ENCOUNTER — Ambulatory Visit (HOSPITAL_BASED_OUTPATIENT_CLINIC_OR_DEPARTMENT_OTHER): Payer: 59

## 2015-01-21 ENCOUNTER — Telehealth: Payer: Self-pay | Admitting: Oncology

## 2015-01-21 ENCOUNTER — Other Ambulatory Visit (HOSPITAL_BASED_OUTPATIENT_CLINIC_OR_DEPARTMENT_OTHER): Payer: 59

## 2015-01-21 ENCOUNTER — Ambulatory Visit (HOSPITAL_BASED_OUTPATIENT_CLINIC_OR_DEPARTMENT_OTHER): Payer: 59 | Admitting: Oncology

## 2015-01-21 VITALS — BP 140/96 | HR 102 | Temp 98.2°F | Resp 20 | Ht 64.0 in | Wt 177.7 lb

## 2015-01-21 DIAGNOSIS — C250 Malignant neoplasm of head of pancreas: Secondary | ICD-10-CM

## 2015-01-21 DIAGNOSIS — M545 Low back pain: Secondary | ICD-10-CM

## 2015-01-21 DIAGNOSIS — R109 Unspecified abdominal pain: Secondary | ICD-10-CM

## 2015-01-21 DIAGNOSIS — G620 Drug-induced polyneuropathy: Secondary | ICD-10-CM

## 2015-01-21 DIAGNOSIS — Z5111 Encounter for antineoplastic chemotherapy: Secondary | ICD-10-CM

## 2015-01-21 LAB — COMPREHENSIVE METABOLIC PANEL (CC13)
ALBUMIN: 2.8 g/dL — AB (ref 3.5–5.0)
ALT: 27 U/L (ref 0–55)
AST: 37 U/L — ABNORMAL HIGH (ref 5–34)
Alkaline Phosphatase: 315 U/L — ABNORMAL HIGH (ref 40–150)
Anion Gap: 7 mEq/L (ref 3–11)
BUN: 8.1 mg/dL (ref 7.0–26.0)
CALCIUM: 8.5 mg/dL (ref 8.4–10.4)
CHLORIDE: 105 meq/L (ref 98–109)
CO2: 28 mEq/L (ref 22–29)
Creatinine: 1.3 mg/dL — ABNORMAL HIGH (ref 0.6–1.1)
EGFR: 60 mL/min/{1.73_m2} — ABNORMAL LOW (ref >90)
Glucose: 115 mg/dl (ref 70–140)
POTASSIUM: 3.3 meq/L — AB (ref 3.5–5.1)
SODIUM: 140 meq/L (ref 136–145)
Total Bilirubin: 0.41 mg/dL (ref 0.20–1.20)
Total Protein: 5.9 g/dL — ABNORMAL LOW (ref 6.4–8.3)

## 2015-01-21 LAB — CBC WITH DIFFERENTIAL/PLATELET
BASO%: 0.3 % (ref 0.0–2.0)
Basophils Absolute: 0 10*3/uL (ref 0.0–0.1)
EOS%: 1.7 % (ref 0.0–7.0)
Eosinophils Absolute: 0.1 10*3/uL (ref 0.0–0.5)
HCT: 27.3 % — ABNORMAL LOW (ref 34.8–46.6)
HGB: 8.9 g/dL — ABNORMAL LOW (ref 11.6–15.9)
LYMPH#: 0.3 10*3/uL — AB (ref 0.9–3.3)
LYMPH%: 11.5 % — ABNORMAL LOW (ref 14.0–49.7)
MCH: 27.3 pg (ref 25.1–34.0)
MCHC: 32.6 g/dL (ref 31.5–36.0)
MCV: 83.7 fL (ref 79.5–101.0)
MONO#: 0.4 10*3/uL (ref 0.1–0.9)
MONO%: 13.5 % (ref 0.0–14.0)
NEUT#: 2.1 10*3/uL (ref 1.5–6.5)
NEUT%: 73 % (ref 38.4–76.8)
Platelets: 193 10*3/uL (ref 145–400)
RBC: 3.26 10*6/uL — ABNORMAL LOW (ref 3.70–5.45)
RDW: 15.3 % — ABNORMAL HIGH (ref 11.2–14.5)
WBC: 2.9 10*3/uL — ABNORMAL LOW (ref 3.9–10.3)

## 2015-01-21 MED ORDER — SODIUM CHLORIDE 0.9 % IV SOLN
800.0000 mg/m2 | Freq: Once | INTRAVENOUS | Status: AC
  Administered 2015-01-21: 1558 mg via INTRAVENOUS
  Filled 2015-01-21: qty 40.98

## 2015-01-21 MED ORDER — PACLITAXEL PROTEIN-BOUND CHEMO INJECTION 100 MG
100.0000 mg/m2 | Freq: Once | INTRAVENOUS | Status: AC
  Administered 2015-01-21: 200 mg via INTRAVENOUS
  Filled 2015-01-21: qty 40

## 2015-01-21 MED ORDER — ONDANSETRON 8 MG/NS 50 ML IVPB
INTRAVENOUS | Status: AC
  Filled 2015-01-21: qty 8

## 2015-01-21 MED ORDER — DEXAMETHASONE SODIUM PHOSPHATE 10 MG/ML IJ SOLN
10.0000 mg | Freq: Once | INTRAMUSCULAR | Status: AC
  Administered 2015-01-21: 10 mg via INTRAVENOUS

## 2015-01-21 MED ORDER — ONDANSETRON 8 MG/50ML IVPB (CHCC)
8.0000 mg | Freq: Once | INTRAVENOUS | Status: AC
  Administered 2015-01-21: 8 mg via INTRAVENOUS

## 2015-01-21 MED ORDER — SODIUM CHLORIDE 0.9 % IJ SOLN
10.0000 mL | INTRAMUSCULAR | Status: DC | PRN
  Administered 2015-01-21: 10 mL
  Filled 2015-01-21: qty 10

## 2015-01-21 MED ORDER — DEXAMETHASONE SODIUM PHOSPHATE 10 MG/ML IJ SOLN
INTRAMUSCULAR | Status: AC
  Filled 2015-01-21: qty 1

## 2015-01-21 MED ORDER — HEPARIN SOD (PORK) LOCK FLUSH 100 UNIT/ML IV SOLN
500.0000 [IU] | Freq: Once | INTRAVENOUS | Status: AC | PRN
  Administered 2015-01-21: 500 [IU]
  Filled 2015-01-21: qty 5

## 2015-01-21 MED ORDER — SODIUM CHLORIDE 0.9 % IV SOLN
Freq: Once | INTRAVENOUS | Status: AC
  Administered 2015-01-21: 14:00:00 via INTRAVENOUS

## 2015-01-21 NOTE — Patient Instructions (Signed)
[facility] Discharge Instructions for Patients Receiving Chemotherapy  Today you received the following chemotherapy agents: Abraxane and Gemzar.  To help prevent nausea and vomiting after your treatment, we encourage you to take your nausea medication: as directed.   If you develop nausea and vomiting that is not controlled by your nausea medication, call the clinic.   BELOW ARE SYMPTOMS THAT SHOULD BE REPORTED IMMEDIATELY:  *FEVER GREATER THAN 100.5 F  *CHILLS WITH OR WITHOUT FEVER  NAUSEA AND VOMITING THAT IS NOT CONTROLLED WITH YOUR NAUSEA MEDICATION  *UNUSUAL SHORTNESS OF BREATH  *UNUSUAL BRUISING OR BLEEDING  TENDERNESS IN MOUTH AND THROAT WITH OR WITHOUT PRESENCE OF ULCERS  *URINARY PROBLEMS  *BOWEL PROBLEMS  UNUSUAL RASH Items with * indicate a potential emergency and should be followed up as soon as possible.  Feel free to call the clinic you have any questions or concerns. The clinic phone number is [phone].

## 2015-01-21 NOTE — Telephone Encounter (Signed)
cld pt to give appt-pt stated got an updated copy b4 she left inf

## 2015-01-21 NOTE — Telephone Encounter (Signed)
Per staff message and POF I have scheduled appts. Advised scheduler of appts. JMW  

## 2015-01-21 NOTE — Progress Notes (Signed)
[place] OFFICE PROGRESS NOTE   Diagnosis: Pancreas cancer  INTERVAL HISTORY:   Ms. Kilduff returns as scheduled. She completed another treatment with gemcitabine/Abraxane 01/07/2015. She reports intermittent abdominal bloating. Abdominal pain has improved. The hand numbness has improved. She continues to have cold sensitivity in the feet. She is working. She has noted improvement in the stool frequency since beginning pancreas enzyme replacement.   Objective:  Vital signs in last 24 hours:  Blood pressure 140/96, pulse 102, temperature 98.2 F (36.8 C), temperature source Oral, resp. rate 20, height 5\' 4"  (1.626 m), weight 177 lb 11.2 oz (80.604 kg), SpO2 100 %.    HEENT: No thrush or ulcers Resp: Lungs clear bilaterally Cardio: Regular rate and rhythm GI: No hepatomegaly, firm mass in the right low abdomen extending to the left side of the suprapubic area Vascular: No leg edema Neuro: The vibratory sense is intact at the fingertips bilaterally    Portacath/PICC-without erythema  Lab Results:  Lab Results  Component Value Date   WBC 2.9* 01/21/2015   HGB 8.9* 01/21/2015   HCT 27.3* 01/21/2015   MCV 83.7 01/21/2015   PLT 193 01/21/2015   NEUTROABS 2.1 01/21/2015      Lab Results  Component Value Date   CEA 59.4* 12/08/2014    Medications: I have reviewed the patient's current medications.  Assessment/Plan: 1. 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 Initiation of FOLFIRINOX 10/29/2013. She did not complete the 5-FU pump due to developing altered speech. Cycle 2 completed 11/12/2013. Cycle 3 completed 11/26/2013. Cycle 4 completed 12/09/2013, cycle 5 12/24/2013, cycle 6 01/14/2014, cycle 7 01/28/2014, cycle 8 02/11/2014, cycle 9 02/25/2014, cycle 10 03/11/2014  Restaging CT 01/06/2012 with a slight increase in the size of the pancreas mass and no clear evidence of distant metastatic disease.   CEA improved 01/28/2014.    Cycle 1 of modified FOLFIRI 03/25/2014.   Cycle 2 modified FOLFIRI 04/08/2014.   CEA stable 04/26/2014.   Restaging CT abdomen/pelvis 04/26/2014 with interval decrease in the size of the pancreatic head mass. Stable mesenteric and retroperitoneal lymphadenopathy. Stable splenomegaly. 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 Stable small liver lesions consistent with benign cysts.   Continuation of FOLFIRI on a 3 week schedule.   CEA increased (13.8) on 06/03/2014.   CEA increased (23.3) on 07/08/2014.   Restaging CT evaluation 08/03/2014 with mild interval enlargement of the pancreatic head mass. Slight increase in the size of small central mesenteric lymph nodes. 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 Common bile duct stent in place without evidence of biliary duct dilatation. Stable small hypodensities within the liver.   Initiation of concurrent capecitabine and radiation 08/19/2014, completed 09/24/2014  CEA mildly improved on 10/18/2014 (19.9).  Restaging CT 11/25/2014 with an enlarged pancreas body mass, new liver lesions, retroperitoneal soft tissue mass obstructing the left ureter, new ascites, loculated peritoneal fluid superior to the bladder  Initiation of gemcitabine/Abraxane day one/day 8 of a 21 day cycle 12/08/2014.  Day 8 treatment held on 12/16/2014 due to neutropenia and thrombocytopenia.  Cycle 2 gemcitabine/Abraxane day 1 12/24/2014.  Cycle 2 day 8 held on 12/31/2014 due to neutropenia  Gemcitabine/Abraxane schedule adjusted to every 2 weeks beginning 01/07/2015. 2. Altered speech prior to completion of cycle 1 of FOLFIRINOX, similar symptoms and tongue/lip twitching following the oxaliplatin with cycle 2 FOLFIRINOX. Oxaliplatin was dose reduced and given over 4 hours beginning with cycle 3.  She had similar neurologic symptoms after subsequent cycles of  FOLFIRINOX. 3. Status post cholecystectomy 10/02/2013 with pathology revealing chronic cholecystitis. 4. Abdomen/back pain secondary to #1. Improved following Xeloda/radiation 5. Hypertension. 6. Status post Port-A-Cath placement 10/28/2013. 7. History of elevated liver enzyme-improved. 8. History of hypokalemia.  9. History of neutropenia secondary to chemotherapy. Neulasta was added beginning with cycle 6 of FOLFIRINOX on 01/14/2014. 10. Delayed nausea. Improved with the addition of Aloxi. 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 12. Low back pain radiating to the left lateral leg status post negative plain x-ray evaluation 11/01/2014. 13. Left hydronephrosis and hydroureter secondary to a retroperitoneal soft tissue mass on the CT 11/25/2014.   Status post left double-J stent placement 12/13/2014 by Dr. Risa Grill. 14. Mild elevation of the creatinine 11/25/2014. Stable 12/24/2014. 15. Loose stools after meals. Question pancreatic insufficiency. Pancreatic enzyme replacement initiated 12/31/2014. Improved. 16. Neutropenia related to chemotherapy 12/31/2014.   Disposition:  Ms. Anfinson has completed 3 treatments with gemcitabine/Abraxane. She appears to be tolerating the chemotherapy well. She will complete another treatment today. I am concerned about the abdominal bloating and palpable mass in the low abdomen today. We will check a CEA when she returns in 2 weeks. The plan is to obtain a restaging CT evaluation after 5 treatments.  Betsy Coder, MD  01/21/2015  2:16 PM

## 2015-01-24 ENCOUNTER — Ambulatory Visit: Payer: 59 | Admitting: Oncology

## 2015-01-25 ENCOUNTER — Telehealth: Payer: Self-pay

## 2015-01-25 NOTE — Telephone Encounter (Signed)
Pt called stating that she saw dr Benay Spice last Friday and had treatment. She had upper epigastric distention/tension at that time. She has been using miralax and it is a little better. She feels she needs a stronger stool softener/laxative. She is having BM but feels they are not complete emptying. Instructed to use OTC softener/laxative like senna-s or dulcolax. If this does not help to give [place] a call back.

## 2015-01-30 ENCOUNTER — Other Ambulatory Visit: Payer: Self-pay | Admitting: Oncology

## 2015-02-04 ENCOUNTER — Ambulatory Visit (HOSPITAL_BASED_OUTPATIENT_CLINIC_OR_DEPARTMENT_OTHER): Payer: 59

## 2015-02-04 ENCOUNTER — Telehealth: Payer: Self-pay | Admitting: *Deleted

## 2015-02-04 ENCOUNTER — Ambulatory Visit (HOSPITAL_BASED_OUTPATIENT_CLINIC_OR_DEPARTMENT_OTHER): Payer: 59 | Admitting: Oncology

## 2015-02-04 ENCOUNTER — Telehealth: Payer: Self-pay | Admitting: Oncology

## 2015-02-04 ENCOUNTER — Other Ambulatory Visit (HOSPITAL_BASED_OUTPATIENT_CLINIC_OR_DEPARTMENT_OTHER): Payer: 59

## 2015-02-04 VITALS — BP 127/83 | HR 108 | Temp 98.6°F | Resp 19 | Ht 64.0 in | Wt 173.6 lb

## 2015-02-04 DIAGNOSIS — C250 Malignant neoplasm of head of pancreas: Secondary | ICD-10-CM

## 2015-02-04 DIAGNOSIS — G620 Drug-induced polyneuropathy: Secondary | ICD-10-CM

## 2015-02-04 DIAGNOSIS — D638 Anemia in other chronic diseases classified elsewhere: Secondary | ICD-10-CM

## 2015-02-04 DIAGNOSIS — D6481 Anemia due to antineoplastic chemotherapy: Secondary | ICD-10-CM

## 2015-02-04 DIAGNOSIS — Z5111 Encounter for antineoplastic chemotherapy: Secondary | ICD-10-CM

## 2015-02-04 LAB — CBC WITH DIFFERENTIAL/PLATELET
BASO%: 0.2 % (ref 0.0–2.0)
Basophils Absolute: 0 10*3/uL (ref 0.0–0.1)
EOS%: 0.2 % (ref 0.0–7.0)
Eosinophils Absolute: 0 10*3/uL (ref 0.0–0.5)
HCT: 26.9 % — ABNORMAL LOW (ref 34.8–46.6)
HGB: 8.2 g/dL — ABNORMAL LOW (ref 11.6–15.9)
LYMPH#: 0.2 10*3/uL — AB (ref 0.9–3.3)
LYMPH%: 5 % — ABNORMAL LOW (ref 14.0–49.7)
MCH: 25.5 pg (ref 25.1–34.0)
MCHC: 30.5 g/dL — AB (ref 31.5–36.0)
MCV: 83.5 fL (ref 79.5–101.0)
MONO#: 0.5 10*3/uL (ref 0.1–0.9)
MONO%: 10.5 % (ref 0.0–14.0)
NEUT#: 3.6 10*3/uL (ref 1.5–6.5)
NEUT%: 84.1 % — ABNORMAL HIGH (ref 38.4–76.8)
PLATELETS: 247 10*3/uL (ref 145–400)
RBC: 3.22 10*6/uL — ABNORMAL LOW (ref 3.70–5.45)
RDW: 15.8 % — AB (ref 11.2–14.5)
WBC: 4.3 10*3/uL (ref 3.9–10.3)

## 2015-02-04 LAB — COMPREHENSIVE METABOLIC PANEL (CC13)
ALBUMIN: 2.6 g/dL — AB (ref 3.5–5.0)
ALT: 16 U/L (ref 0–55)
ANION GAP: 11 meq/L (ref 3–11)
AST: 29 U/L (ref 5–34)
Alkaline Phosphatase: 367 U/L — ABNORMAL HIGH (ref 40–150)
BUN: 7.2 mg/dL (ref 7.0–26.0)
CALCIUM: 8.7 mg/dL (ref 8.4–10.4)
CO2: 24 meq/L (ref 22–29)
Chloride: 104 mEq/L (ref 98–109)
Creatinine: 1.2 mg/dL — ABNORMAL HIGH (ref 0.6–1.1)
EGFR: 61 mL/min/{1.73_m2} — AB (ref >90)
Glucose: 96 mg/dl (ref 70–140)
POTASSIUM: 3.5 meq/L (ref 3.5–5.1)
SODIUM: 140 meq/L (ref 136–145)
Total Bilirubin: 0.5 mg/dL (ref 0.20–1.20)
Total Protein: 5.9 g/dL — ABNORMAL LOW (ref 6.4–8.3)

## 2015-02-04 MED ORDER — SODIUM CHLORIDE 0.9 % IJ SOLN
10.0000 mL | INTRAMUSCULAR | Status: DC | PRN
  Administered 2015-02-04: 10 mL
  Filled 2015-02-04: qty 10

## 2015-02-04 MED ORDER — PACLITAXEL PROTEIN-BOUND CHEMO INJECTION 100 MG
100.0000 mg/m2 | Freq: Once | INTRAVENOUS | Status: AC
  Administered 2015-02-04: 200 mg via INTRAVENOUS
  Filled 2015-02-04: qty 40

## 2015-02-04 MED ORDER — HEPARIN SOD (PORK) LOCK FLUSH 100 UNIT/ML IV SOLN
500.0000 [IU] | Freq: Once | INTRAVENOUS | Status: AC | PRN
  Administered 2015-02-04: 500 [IU]
  Filled 2015-02-04: qty 5

## 2015-02-04 MED ORDER — ONDANSETRON 8 MG/50ML IVPB (CHCC)
8.0000 mg | Freq: Once | INTRAVENOUS | Status: AC
  Administered 2015-02-04: 8 mg via INTRAVENOUS

## 2015-02-04 MED ORDER — DEXAMETHASONE SODIUM PHOSPHATE 10 MG/ML IJ SOLN
10.0000 mg | Freq: Once | INTRAMUSCULAR | Status: AC
  Administered 2015-02-04: 10 mg via INTRAVENOUS

## 2015-02-04 MED ORDER — DEXAMETHASONE SODIUM PHOSPHATE 10 MG/ML IJ SOLN
INTRAMUSCULAR | Status: AC
Start: 2015-02-04 — End: 2015-02-04
  Filled 2015-02-04: qty 1

## 2015-02-04 MED ORDER — SODIUM CHLORIDE 0.9 % IV SOLN
Freq: Once | INTRAVENOUS | Status: AC
  Administered 2015-02-04: 14:00:00 via INTRAVENOUS

## 2015-02-04 MED ORDER — ONDANSETRON 8 MG/NS 50 ML IVPB
INTRAVENOUS | Status: AC
Start: 2015-02-04 — End: 2015-02-04
  Filled 2015-02-04: qty 8

## 2015-02-04 MED ORDER — SODIUM CHLORIDE 0.9 % IV SOLN
800.0000 mg/m2 | Freq: Once | INTRAVENOUS | Status: AC
  Administered 2015-02-04: 1558 mg via INTRAVENOUS
  Filled 2015-02-04: qty 40.98

## 2015-02-04 NOTE — Telephone Encounter (Signed)
Per staff message and POF I have scheduled appts. Advised scheduler of appts. JMW  

## 2015-02-04 NOTE — Patient Instructions (Signed)
[facility] Discharge Instructions for Patients Receiving Chemotherapy  Today you received the following chemotherapy agents: Abraxane and Gemzar   To help prevent nausea and vomiting after your treatment, we encourage you to take your nausea medication as prescribed.    If you develop nausea and vomiting that is not controlled by your nausea medication, call the clinic.   BELOW ARE SYMPTOMS THAT SHOULD BE REPORTED IMMEDIATELY:  *FEVER GREATER THAN 100.5 F  *CHILLS WITH OR WITHOUT FEVER  NAUSEA AND VOMITING THAT IS NOT CONTROLLED WITH YOUR NAUSEA MEDICATION  *UNUSUAL SHORTNESS OF BREATH  *UNUSUAL BRUISING OR BLEEDING  TENDERNESS IN MOUTH AND THROAT WITH OR WITHOUT PRESENCE OF ULCERS  *URINARY PROBLEMS  *BOWEL PROBLEMS  UNUSUAL RASH Items with * indicate a potential emergency and should be followed up as soon as possible.  Feel free to call the clinic you have any questions or concerns. The clinic phone number is [phone].

## 2015-02-04 NOTE — Telephone Encounter (Signed)
Pt confirmed labs/ov per 02/26 POF, gave pt AVS.... KJ, sent msg to add chemo and scheduled CT at GI due to [facility].Marland KitchenMarland KitchenMarland Kitchen

## 2015-02-04 NOTE — Progress Notes (Signed)
[facility] OFFICE PROGRESS NOTE   Diagnosis: Pancreas cancer  INTERVAL HISTORY:   Frances [name] returns as scheduled. She completed another treatment with gemcitabine/Abraxane 01/21/2015. She tolerated the chemotherapy well. She has stable numbness and tingling in the right greater than left sole of the foot. She continues to have abdominal bloating. She is no longer taking pancreas enzyme replacement as she has been constipated. The constipation persists despite MiraLAX and senna. She noted blood mixed in the stool last night and this morning.  Objective:  Vital signs in last 24 hours:  Blood pressure 127/83, pulse 108, temperature 98.6 F (37 C), temperature source Oral, resp. rate 19, height 5\' 4"  (1.626 m), weight 173 lb 9.6 oz (78.744 kg), SpO2 100 %.    HEENT: No thrush or ulcers Resp: Lungs clear bilaterally Cardio: Regular rate and rhythm GI: No apparent ascites. The abdomen is distended. Masslike firmness throughout the right abdomen and central lower abdomen Vascular: No leg edema  Portacath/PICC-without erythema  Lab Results:  Lab Results  Component Value Date   WBC 4.3 02/04/2015   HGB 8.2* 02/04/2015   HCT 26.9* 02/04/2015   MCV 83.5 02/04/2015   PLT 247 02/04/2015   NEUTROABS 3.6 02/04/2015     Lab Results  Component Value Date   CEA 59.4* 12/08/2014    Medications: I have reviewed the patient's current medications.  Assessment/Plan: 1. 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 Initiation of FOLFIRINOX 10/29/2013. She did not complete the 5-FU pump due to developing altered speech. Cycle 2 completed 11/12/2013. Cycle 3 completed 11/26/2013. Cycle 4 completed 12/09/2013, cycle 5 12/24/2013, cycle 6 01/14/2014, cycle 7 01/28/2014, cycle 8 02/11/2014, cycle 9 02/25/2014, cycle 10 03/11/2014  Restaging CT 01/06/2012 with a slight increase in the size of the pancreas mass and no clear evidence of distant metastatic disease.    CEA improved 01/28/2014.   Cycle 1 of modified FOLFIRI 03/25/2014.   Cycle 2 modified FOLFIRI 04/08/2014.   CEA stable 04/26/2014.   Restaging CT abdomen/pelvis 04/26/2014 with interval decrease in the size of the pancreatic head mass. Stable mesenteric and retroperitoneal lymphadenopathy. Stable splenomegaly. 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 Stable small liver lesions consistent with benign cysts.   Continuation of FOLFIRI on a 3 week schedule.   CEA increased (13.8) on 06/03/2014.   CEA increased (23.3) on 07/08/2014.   Restaging CT evaluation 08/03/2014 with mild interval enlargement of the pancreatic head mass. Slight increase in the size of small central mesenteric lymph nodes. 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 Common bile duct stent in place without evidence of biliary duct dilatation. Stable small hypodensities within the liver.   Initiation of concurrent capecitabine and radiation 08/19/2014, completed 09/24/2014  CEA mildly improved on 10/18/2014 (19.9).  Restaging CT 11/25/2014 with an enlarged pancreas body mass, new liver lesions, retroperitoneal soft tissue mass obstructing the left ureter, new ascites, loculated peritoneal fluid superior to the bladder  Initiation of gemcitabine/Abraxane day one/day 8 of a 21 day cycle 12/08/2014.  Day 8 treatment held on 12/16/2014 due to neutropenia and thrombocytopenia.  Cycle 2 gemcitabine/Abraxane day 1 12/24/2014.  Cycle 2 day 8 held on 12/31/2014 due to neutropenia  Gemcitabine/Abraxane schedule adjusted to every 2 weeks beginning 01/07/2015. 2. Altered speech prior to completion of cycle 1 of FOLFIRINOX, similar symptoms and tongue/lip twitching following the oxaliplatin with cycle 2 FOLFIRINOX. Oxaliplatin was dose reduced and given over 4  hours beginning with cycle 3. She had similar neurologic  symptoms after subsequent cycles of FOLFIRINOX. 3. Status post cholecystectomy 10/02/2013 with pathology revealing chronic cholecystitis. 4. Abdomen/back pain secondary to #1. Improved following Xeloda/radiation 5. Hypertension. 6. Status post Port-A-Cath placement 10/28/2013. 7. History of elevated liver enzyme-improved. 8. History of hypokalemia.  9. History of neutropenia secondary to chemotherapy. Neulasta was added beginning with cycle 6 of FOLFIRINOX on 01/14/2014. 10. Delayed nausea. Improved with the addition of Aloxi. 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 12. Low back pain radiating to the left lateral leg status post negative plain x-ray evaluation 11/01/2014. 13. Left hydronephrosis and hydroureter secondary to a retroperitoneal soft tissue mass on the CT 11/25/2014.   Status post left double-J stent placement 12/13/2014 by Dr. Risa Grill. 14. Mild elevation of the creatinine 11/25/2014. Stable 12/24/2014. 15. Loose stools after meals. Question pancreatic insufficiency. Pancreatic enzyme replacement initiated 12/31/2014. Improved. 16. Neutropenia related to chemotherapy 12/31/2014. 17. Anemia secondary to chronic disease, chemotherapy, and possibly rectal bleeding  Disposition:  Frances [name] has completed 4 treatments with gemcitabine/Abraxane. She appears to be tolerating the chemotherapy well. She will complete a fifth treatment today. The abdomen remains distended and firm. I am concerned this is related to progression of the metastatic pancreas cancer. She will be scheduled for a restaging CT prior to an office visit in 2 weeks. We checked a CEA today. We will check a CBC/ type and hold when she returns in 2 weeks.  Betsy Coder, MD  02/04/2015  12:31 PM

## 2015-02-05 LAB — CEA: CEA: 207.5 ng/mL — ABNORMAL HIGH (ref 0.0–5.0)

## 2015-02-09 ENCOUNTER — Other Ambulatory Visit: Payer: Self-pay | Admitting: *Deleted

## 2015-02-09 MED ORDER — SORBITOL 70 % PO SOLN: 15.0000 mL | Freq: Every day | ORAL | Status: DC | PRN

## 2015-02-11 ENCOUNTER — Ambulatory Visit (HOSPITAL_COMMUNITY)
Admission: RE | Admit: 2015-02-11 | Discharge: 2015-02-11 | Disposition: A | Discharge status: Home / Self Care | Payer: 59 | Source: Ambulatory Visit | Attending: Oncology | Admitting: Oncology

## 2015-02-11 ENCOUNTER — Ambulatory Visit (HOSPITAL_BASED_OUTPATIENT_CLINIC_OR_DEPARTMENT_OTHER): Payer: 59

## 2015-02-11 ENCOUNTER — Ambulatory Visit
Admission: RE | Admit: 2015-02-11 | Discharge: 2015-02-11 | Disposition: A | Discharge status: Home / Self Care | Payer: 59 | Source: Ambulatory Visit | Attending: Oncology | Admitting: Oncology

## 2015-02-11 ENCOUNTER — Other Ambulatory Visit: Payer: Self-pay | Admitting: *Deleted

## 2015-02-11 ENCOUNTER — Other Ambulatory Visit (HOSPITAL_BASED_OUTPATIENT_CLINIC_OR_DEPARTMENT_OTHER): Payer: 59

## 2015-02-11 VITALS — BP 134/95 | HR 104 | Temp 97.9°F | Resp 18

## 2015-02-11 DIAGNOSIS — D638 Anemia in other chronic diseases classified elsewhere: Secondary | ICD-10-CM

## 2015-02-11 DIAGNOSIS — D6481 Anemia due to antineoplastic chemotherapy: Secondary | ICD-10-CM

## 2015-02-11 DIAGNOSIS — D649 Anemia, unspecified: Secondary | ICD-10-CM | POA: Diagnosis not present

## 2015-02-11 DIAGNOSIS — C250 Malignant neoplasm of head of pancreas: Secondary | ICD-10-CM

## 2015-02-11 LAB — CBC WITH DIFFERENTIAL/PLATELET
BASO%: 1 % (ref 0.0–2.0)
Basophils Absolute: 0 10*3/uL (ref 0.0–0.1)
EOS%: 1 % (ref 0.0–7.0)
Eosinophils Absolute: 0 10*3/uL (ref 0.0–0.5)
HEMATOCRIT: 21.9 % — AB (ref 34.8–46.6)
HGB: 7 g/dL — ABNORMAL LOW (ref 11.6–15.9)
LYMPH%: 16.8 % (ref 14.0–49.7)
MCH: 26.2 pg (ref 25.1–34.0)
MCHC: 32 g/dL (ref 31.5–36.0)
MCV: 82 fL (ref 79.5–101.0)
MONO#: 0.2 10*3/uL (ref 0.1–0.9)
MONO%: 18.8 % — AB (ref 0.0–14.0)
NEUT#: 0.6 10*3/uL — ABNORMAL LOW (ref 1.5–6.5)
NEUT%: 62.4 % (ref 38.4–76.8)
PLATELETS: 195 10*3/uL (ref 145–400)
RBC: 2.67 10*6/uL — AB (ref 3.70–5.45)
RDW: 15.4 % — ABNORMAL HIGH (ref 11.2–14.5)
WBC: 1 10*3/uL — ABNORMAL LOW (ref 3.9–10.3)
lymph#: 0.2 10*3/uL — ABNORMAL LOW (ref 0.9–3.3)

## 2015-02-11 LAB — HOLD TUBE, BLOOD BANK

## 2015-02-11 LAB — COMPREHENSIVE METABOLIC PANEL (CC13)
ALT: 27 U/L (ref 0–55)
ANION GAP: 10 meq/L (ref 3–11)
AST: 52 U/L — ABNORMAL HIGH (ref 5–34)
Albumin: 2.3 g/dL — ABNORMAL LOW (ref 3.5–5.0)
Alkaline Phosphatase: 373 U/L — ABNORMAL HIGH (ref 40–150)
BUN: 4.8 mg/dL — ABNORMAL LOW (ref 7.0–26.0)
CHLORIDE: 103 meq/L (ref 98–109)
CO2: 26 meq/L (ref 22–29)
CREATININE: 1.1 mg/dL (ref 0.6–1.1)
Calcium: 8.4 mg/dL (ref 8.4–10.4)
EGFR: 73 mL/min/{1.73_m2} — AB (ref >90)
Glucose: 86 mg/dl (ref 70–140)
Potassium: 3.1 mEq/L — ABNORMAL LOW (ref 3.5–5.1)
SODIUM: 139 meq/L (ref 136–145)
TOTAL PROTEIN: 5.3 g/dL — AB (ref 6.4–8.3)
Total Bilirubin: 0.41 mg/dL (ref 0.20–1.20)

## 2015-02-11 LAB — ABO/RH: ABO/RH(D): O NEG

## 2015-02-11 LAB — PREPARE RBC (CROSSMATCH)

## 2015-02-11 MED ORDER — HEPARIN SOD (PORK) LOCK FLUSH 100 UNIT/ML IV SOLN
500.0000 [IU] | Freq: Every day | INTRAVENOUS | Status: AC | PRN
  Administered 2015-02-11: 500 [IU]
  Filled 2015-02-11: qty 5

## 2015-02-11 MED ORDER — SODIUM CHLORIDE 0.9 % IJ SOLN
10.0000 mL | INTRAMUSCULAR | Status: AC | PRN
  Administered 2015-02-11: 10 mL
  Filled 2015-02-11: qty 10

## 2015-02-11 MED ORDER — IOPAMIDOL (ISOVUE-300) INJECTION 61%
100.0000 mL | Freq: Once | INTRAVENOUS | Status: AC | PRN
  Administered 2015-02-11: 100 mL via INTRAVENOUS

## 2015-02-11 MED ORDER — SODIUM CHLORIDE 0.9 % IV SOLN
250.0000 mL | Freq: Once | INTRAVENOUS | Status: AC
  Administered 2015-02-11: 250 mL via INTRAVENOUS

## 2015-02-11 NOTE — Patient Instructions (Addendum)
Blood Transfusion Information WHAT IS A BLOOD TRANSFUSION? A transfusion is the replacement of blood or some of its parts. Blood is made up of multiple cells which provide different functions.  Red blood cells carry oxygen and are used for blood loss replacement.  White blood cells fight against infection.  Platelets control bleeding.  Plasma helps clot blood.  Other blood products are available for specialized needs, such as hemophilia or other clotting disorders. BEFORE THE TRANSFUSION  Who gives blood for transfusions?   You may be able to donate blood to be used at a later date on yourself (autologous donation).  Relatives can be asked to donate blood. This is generally not any safer than if you have received blood from a stranger. The same precautions are taken to ensure safety when a relative's blood is donated.  Healthy volunteers who are fully evaluated to make sure their blood is safe. This is blood bank blood. Transfusion therapy is the safest it has ever been in the practice of medicine. Before blood is taken from a donor, a complete history is taken to make sure that person has no history of diseases nor engages in risky social behavior (examples are intravenous drug use or sexual activity with multiple partners). The donor's travel history is screened to minimize risk of transmitting infections, such as malaria. The donated blood is tested for signs of infectious diseases, such as HIV and hepatitis. The blood is then tested to be sure it is compatible with you in order to minimize the chance of a transfusion reaction. If you or a relative donates blood, this is often done in anticipation of surgery and is not appropriate for emergency situations. It takes many days to process the donated blood. RISKS AND COMPLICATIONS Although transfusion therapy is very safe and saves many lives, the main dangers of transfusion include:   Getting an infectious disease.  Developing a  transfusion reaction. This is an allergic reaction to something in the blood you were given. Every precaution is taken to prevent this. The decision to have a blood transfusion has been considered carefully by your caregiver before blood is given. Blood is not given unless the benefits outweigh the risks. AFTER THE TRANSFUSION  Right after receiving a blood transfusion, you will usually feel much better and more energetic. This is especially true if your red blood cells have gotten low (anemic). The transfusion raises the level of the red blood cells which carry oxygen, and this usually causes an energy increase.  The nurse administering the transfusion will monitor you carefully for complications. HOME CARE INSTRUCTIONS  No special instructions are needed after a transfusion. You may find your energy is better. Speak with your caregiver about any limitations on activity for underlying diseases you may have. SEEK MEDICAL CARE IF:   Your condition is not improving after your transfusion.  You develop redness or irritation at the intravenous (IV) site. SEEK IMMEDIATE MEDICAL CARE IF:  Any of the following symptoms occur over the next 12 hours:  Shaking chills.  You have a temperature by mouth above 102 F (38.9 C), not controlled by medicine.  Chest, back, or muscle pain.  People around you feel you are not acting correctly or are confused.  Shortness of breath or difficulty breathing.  Dizziness and fainting.  You get a rash or develop hives.  You have a decrease in urine output.  Your urine turns a dark color or changes to pink, red, or brown. Any of the following   symptoms occur over the next 10 days:  You have a temperature by mouth above 102 F (38.9 C), not controlled by medicine.  Shortness of breath.  Weakness after normal activity.  The white part of the eye turns yellow (jaundice).  You have a decrease in the amount of urine or are urinating less often.  Your  urine turns a dark color or changes to pink, red, or brown. Document Released: 11/23/2000 Document Revised: 02/18/2012 Document Reviewed: 07/12/2008 [facility] Patient Information 2015 [place], [place]. This information is not intended to replace advice given to you by your health care provider. Make sure you discuss any questions you have with your health care provider. Neutropenia Neutropenia is a condition that occurs when the level of a certain type of white blood cell (neutrophil) in your body becomes lower than normal. Neutrophils are made in the bone marrow and fight infections. These cells protect against bacteria and viruses. The fewer neutrophils you have, and the longer your body remains without them, the greater your risk of getting a severe infection becomes. CAUSES  The cause of neutropenia may be hard to determine. However, it is usually due to 3 main problems:   Decreased production of neutrophils. This may be due to:  Certain medicines such as chemotherapy.  Genetic problems.  Cancer.  Radiation treatments.  Vitamin deficiency.  Some pesticides.  Increased destruction of neutrophils. This may be due to:  Overwhelming infections.  Hemolytic anemia. This is when the body destroys its own blood cells.  Chemotherapy.  Neutrophils moving to areas of the body where they cannot fight infections. This may be due to:  Dialysis procedures.  Conditions where the spleen becomes enlarged. Neutrophils are held in the spleen and are not available to the rest of the body.  Overwhelming infections. The neutrophils are held in the area of the infection and are not available to the rest of the body. SYMPTOMS  There are no specific symptoms of neutropenia. The lack of neutrophils can result in an infection, and an infection can cause various problems. DIAGNOSIS  Diagnosis is made by a blood test. A complete blood count is performed. The normal level of neutrophils in human blood  differs with age and race. Infants have lower counts than older children and adults. African Americans have lower counts than Caucasians or Asians. The average adult level is 1500 cells/mm3 of blood. Neutrophil counts are interpreted as follows:  Greater than 1000 cells/mm3 gives normal protection against infection.  500 to 1000 cells/mm3 gives an increased risk for infection.  200 to 500 cells/mm3 is a greater risk for severe infection.  Lower than 200 cells/mm3 is a marked risk of infection. This may require hospitalization and treatment with antibiotic medicines. TREATMENT  Treatment depends on the underlying cause, severity, and presence of infections or symptoms. It also depends on your health. Your caregiver will discuss the treatment plan with you. Mild cases are often easily treated and have a good outcome. Preventative measures may also be started to limit your risk of infections. Treatment can include:  Taking antibiotics.  Stopping medicines that are known to cause neutropenia.  Correcting nutritional deficiencies by eating green vegetables to supply folic acid and taking vitamin B supplements.  Stopping exposure to pesticides if your neutropenia is related to pesticide exposure.  Taking a blood growth factor called sargramostim, pegfilgrastim, or filgrastim if you are undergoing chemotherapy for cancer. This stimulates white blood cell production.  Removal of the spleen if you have Felty's syndrome  and have repeated infections. HOME CARE INSTRUCTIONS   Follow your caregiver's instructions about when you need to have blood work done.  Wash your hands often. Make sure others who come in contact with you also wash their hands.  Wash raw fruits and vegetables before eating them. They can carry bacteria and fungi.  Avoid people with colds or spreadable (contagious) diseases (chickenpox, herpes zoster, influenza).  Avoid large crowds.  Avoid construction areas. The dust can  release fungus into the air.  Be cautious around children in daycare or school environments.  Take care of your respiratory system by coughing and deep breathing.  Bathe daily.  Protect your skin from cuts and burns.  Do not work in the garden or with flowers and plants.  Care for the mouth before and after meals by brushing with a soft toothbrush. If you have mucositis, do not use mouthwash. Mouthwash contains alcohol and can dry out the mouth even more.  Clean the area between the genitals and the anus (perineal area) after urination and bowel movements. Women need to wipe from front to back.  Use a water soluble lubricant during sexual intercourse and practice good hygiene after. Do not have intercourse if you are severely neutropenic. Check with your caregiver for guidelines.  Exercise daily as tolerated.  Avoid people who were vaccinated with a live vaccine in the past 30 days. You should not receive live vaccines (polio, typhoid).  Do not provide direct care for pets. Avoid animal droppings. Do not clean litter boxes and bird cages.  Do not share food utensils.  Do not use tampons, enemas, or rectal suppositories unless directed by your caregiver.  Use an electric razor to remove hair.  Wash your hands after handling magazines, letters, and newspapers. SEEK IMMEDIATE MEDICAL CARE IF:   You have a fever.  You have chills or start to shake.  You feel nauseous or vomit.  You develop mouth sores.  You develop aches and pains.  You have redness and swelling around open wounds.  Your skin is warm to the touch.  You have pus coming from your wounds.  You develop swollen lymph nodes.  You feel weak or fatigued.  You develop red streaks on the skin. MAKE SURE YOU:  Understand these instructions.  Will watch your condition.  Will get help right away if you are not doing well or get worse. Document Released: 05/18/2002 Document Revised: 02/18/2012 Document  Reviewed: 06/15/2011 [facility] Patient Information 2015 [place], [place]. This information is not intended to replace advice given to you by your health care provider. Make sure you discuss any questions you have with your health care provider.

## 2015-02-13 LAB — TYPE AND SCREEN
ABO/RH(D): O NEG
Antibody Screen: NEGATIVE
UNIT DIVISION: 0
Unit division: 0

## 2015-02-15 ENCOUNTER — Telehealth: Payer: Self-pay | Admitting: Nurse Practitioner

## 2015-02-15 NOTE — Telephone Encounter (Signed)
Patient left voice message on triage line reporting continued abdominal bloating without relief and "swelling in my legs and ankles," onset over the weekend. Patient requesting call from the nurse. Last office note given to Ned Card, NP for review. Patient received 2 units RBC's and had CT scan on 02/11/15. This RN to follow up with patient after NP advises.

## 2015-02-15 NOTE — Telephone Encounter (Signed)
Reviewed pt's call with Dr. Benay Spice: Order received to bring pt in for office visit. Pt declined to come in today. Will be worked in 3/9 at [employer]. Order to schedulers.

## 2015-02-15 NOTE — Telephone Encounter (Signed)
Returned call to pt, she reports edema in legs bilaterally up to the knee. Denies pain. Swelling goes down some after elevating feet. Pt also reports abdominal bloating, interferes with PO intake. Reports worsening abdominal discomfort. Denies dyspnea. Bowels are moving with laxatives. Pt has resumed her Creon. Says Dr. Benay Spice mentioned there may be fluid in the abdomen, asked if there is a pill to remove this fluid? Informed her diuretics don't usually help fluid in the abdomen, some patients benefit from a procedure to remove fluid.  Will review with Dr. Benay Spice.

## 2015-02-16 ENCOUNTER — Other Ambulatory Visit: Payer: Self-pay | Admitting: Oncology

## 2015-02-16 ENCOUNTER — Ambulatory Visit (HOSPITAL_COMMUNITY)
Admission: RE | Admit: 2015-02-16 | Discharge: 2015-02-16 | Disposition: A | Discharge status: Home / Self Care | Payer: 59 | Source: Ambulatory Visit | Attending: Oncology | Admitting: Oncology

## 2015-02-16 ENCOUNTER — Ambulatory Visit (HOSPITAL_BASED_OUTPATIENT_CLINIC_OR_DEPARTMENT_OTHER): Payer: 59 | Admitting: Oncology

## 2015-02-16 VITALS — BP 148/96 | HR 100 | Temp 98.4°F | Resp 18 | Ht 64.0 in | Wt 180.6 lb

## 2015-02-16 DIAGNOSIS — D6481 Anemia due to antineoplastic chemotherapy: Secondary | ICD-10-CM

## 2015-02-16 DIAGNOSIS — R140 Abdominal distension (gaseous): Secondary | ICD-10-CM

## 2015-02-16 DIAGNOSIS — K669 Disorder of peritoneum, unspecified: Secondary | ICD-10-CM

## 2015-02-16 DIAGNOSIS — G893 Neoplasm related pain (acute) (chronic): Secondary | ICD-10-CM

## 2015-02-16 DIAGNOSIS — C250 Malignant neoplasm of head of pancreas: Secondary | ICD-10-CM | POA: Insufficient documentation

## 2015-02-16 DIAGNOSIS — I10 Essential (primary) hypertension: Secondary | ICD-10-CM

## 2015-02-16 DIAGNOSIS — D638 Anemia in other chronic diseases classified elsewhere: Secondary | ICD-10-CM

## 2015-02-16 DIAGNOSIS — K769 Liver disease, unspecified: Secondary | ICD-10-CM

## 2015-02-16 DIAGNOSIS — M7989 Other specified soft tissue disorders: Secondary | ICD-10-CM

## 2015-02-16 DIAGNOSIS — N1330 Unspecified hydronephrosis: Secondary | ICD-10-CM

## 2015-02-16 DIAGNOSIS — R110 Nausea: Secondary | ICD-10-CM

## 2015-02-16 DIAGNOSIS — R188 Other ascites: Secondary | ICD-10-CM | POA: Insufficient documentation

## 2015-02-16 NOTE — Procedures (Signed)
US guided aspiration of ant/upper abdominal fluid collection performed yielding 350 cc's bloody fluid. The fluid was sent to the lab for cytology. No immediate complications.

## 2015-02-16 NOTE — [facility] Oncology Navigator Note (Signed)
Met with Frances [name] and her husband after MD visit today. Will be having U/S drainage today. She declines any immediate needs from navigator at this time. Offered encouragement.

## 2015-02-16 NOTE — Progress Notes (Signed)
[place] OFFICE PROGRESS NOTE   Diagnosis: Pancreas cancer  INTERVAL HISTORY:   Frances [name] returns prior to a scheduled visit. She was transfused with 2 units of packed red cells on 02/11/2015 after the hemoglobin returned at 7. She reports feeling stronger after the red cell transfusion. No further rectal bleeding. She complains of increased abdominal distention. She has developed lower leg swelling bilaterally.  Objective:  Vital signs in last 24 hours:  Blood pressure 148/96, pulse 100, temperature 98.4 F (36.9 C), temperature source Oral, resp. rate 18, height 5\' 4"  (1.626 m), weight 180 lb 9.6 oz (81.92 kg), SpO2 100 %.    Resp: Lungs clear bilaterally Cardio: Regular rate and rhythm GI: The abdomen is markedly distended with a firm mass in the mid and lower abdomen Vascular: 1+ edema below the knee bilaterally, no erythema  Portacath/PICC-without erythema  Lab Results:  Lab Results  Component Value Date   WBC 1.0* 02/11/2015   HGB 7.0* 02/11/2015   HCT 21.9* 02/11/2015   MCV 82.0 02/11/2015   PLT 195 02/11/2015   NEUTROABS 0.6* 02/11/2015     Lab Results  Component Value Date   CEA 207.5* 02/04/2015    Imaging:  CT of the abdomen and pelvis 02/11/2015, compared to 11/25/2014-increased liver metastases, progression of a large cystic and solid mass in the anterior peritoneal cavity, abdominal wall metastatic lesion, diffuse sclerosis of L1 suspicious for metastatic involvement Medications: I have reviewed the patient's current medications.  Assessment/Plan: 1. 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 Initiation of FOLFIRINOX 10/29/2013. She did not complete the 5-FU pump due to developing altered speech. Cycle 2 completed 11/12/2013. Cycle 3 completed 11/26/2013. Cycle 4 completed 12/09/2013, cycle 5 12/24/2013, cycle 6 01/14/2014, cycle 7 01/28/2014, cycle 8 02/11/2014, cycle 9 02/25/2014, cycle 10 03/11/2014  Restaging  CT 01/06/2012 with a slight increase in the size of the pancreas mass and no clear evidence of distant metastatic disease.   CEA improved 01/28/2014.   Cycle 1 of modified FOLFIRI 03/25/2014.   Cycle 2 modified FOLFIRI 04/08/2014.   CEA stable 04/26/2014.   Restaging CT abdomen/pelvis 04/26/2014 with interval decrease in the size of the pancreatic head mass. Stable mesenteric and retroperitoneal lymphadenopathy. Stable splenomegaly. 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 Stable small liver lesions consistent with benign cysts.   Continuation of FOLFIRI on a 3 week schedule.   CEA increased (13.8) on 06/03/2014.   CEA increased (23.3) on 07/08/2014.   Restaging CT evaluation 08/03/2014 with mild interval enlargement of the pancreatic head mass. Slight increase in the size of small central mesenteric lymph nodes. 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 Common bile duct stent in place without evidence of biliary duct dilatation. Stable small hypodensities within the liver.   Initiation of concurrent capecitabine and radiation 08/19/2014, completed 09/24/2014  CEA mildly improved on 10/18/2014 (19.9).  Restaging CT 11/25/2014 with an enlarged pancreas body mass, new liver lesions, retroperitoneal soft tissue mass obstructing the left ureter, new ascites, loculated peritoneal fluid superior to the bladder  Initiation of gemcitabine/Abraxane day one/day 8 of a 21 day cycle 12/08/2014.  Day 8 treatment held on 12/16/2014 due to neutropenia and thrombocytopenia.  Cycle 2 gemcitabine/Abraxane day 1 12/24/2014.  Cycle 2 day 8 held on 12/31/2014 due to neutropenia  Gemcitabine/Abraxane schedule adjusted to every 2 weeks beginning 01/07/2015. Last given 02/04/2015  CT 02/11/2015 confirmed progression of a cystic/solid anterior peritoneal  mass, progressive liver lesions, and  suspicion for a metastasis at L1  2. Altered speech prior to completion of cycle 1 of FOLFIRINOX, similar symptoms and tongue/lip twitching following the oxaliplatin with cycle 2 FOLFIRINOX. Oxaliplatin was dose reduced and given over 4 hours beginning with cycle 3. She had similar neurologic symptoms after subsequent cycles of FOLFIRINOX. 3. Status post cholecystectomy 10/02/2013 with pathology revealing chronic cholecystitis. 4. Abdomen/back pain secondary to #1. Improved following Xeloda/radiation 5. Hypertension. 6. Status post Port-A-Cath placement 10/28/2013. 7. History of elevated liver enzyme-improved. 8. History of hypokalemia.  9. History of neutropenia secondary to chemotherapy. Neulasta was added beginning with cycle 6 of FOLFIRINOX on 01/14/2014. 10. Delayed nausea. Improved with the addition of Aloxi. 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 12. Low back pain radiating to the left lateral leg status post negative plain x-ray evaluation 11/01/2014. 13. Left hydronephrosis and hydroureter secondary to a retroperitoneal soft tissue mass on the CT 11/25/2014.   Status post left double-J stent placement 12/13/2014 by Dr. Risa Grill. 14. Mild elevation of the creatinine 11/25/2014. Stable 12/24/2014. 15. Loose stools after meals. Question pancreatic insufficiency. Pancreatic enzyme replacement initiated 12/31/2014. Improved. 16. Neutropenia related to chemotherapy 17. Anemia secondary to chronic disease, chemotherapy, and rectal bleeding-status post a red cell transfusion 02/11/2015    Disposition:  Frances [name] has increased abdominal distention secondary to a progressive low abdominal mass. The restaging CT confirms disease progression. I reviewed the CT images with Frances [name] and her husband. She understands systemic treatment options are limited given her treatment history.  I will refer her to radiology for a palliative paracentesis to drain the dominant abdomen/pelvic cystic mass.  I  will contact Dr. Reynaldo Minium to see if she is a candidate for a clinical trial at [facility].  She will return for an office appointment an office visit as scheduled later this week. She will try leg elevation and support stockings for the edema.  Betsy Coder, MD  02/16/2015  1:03 PM

## 2015-02-17 ENCOUNTER — Telehealth: Payer: Self-pay | Admitting: *Deleted

## 2015-02-17 ENCOUNTER — Other Ambulatory Visit: Payer: 59

## 2015-02-17 DIAGNOSIS — C250 Malignant neoplasm of head of pancreas: Secondary | ICD-10-CM

## 2015-02-17 NOTE — Telephone Encounter (Signed)
Dr. Benay Spice discussed case with Dr. Reynaldo Minium. Pt may be eligible for clinical trial. Referral sent. Called pt with this information. She agrees to cancel 3/11 appointment. Will reschedule after she sees Dr. Reynaldo Minium. Pt reports some relief after procedure 3/9. Discussed using compression hose for BLE edema.

## 2015-02-18 ENCOUNTER — Encounter: Payer: Self-pay | Admitting: Oncology

## 2015-02-18 ENCOUNTER — Ambulatory Visit: Payer: 59 | Admitting: Nurse Practitioner

## 2015-02-18 ENCOUNTER — Ambulatory Visit: Payer: 59

## 2015-02-18 NOTE — Progress Notes (Signed)
I placed fmla forms from [employer] for the patient's hubby Arnell Sieving on the desk of nurse for Dr. Benay Spice.

## 2015-02-21 ENCOUNTER — Telehealth: Payer: Self-pay | Admitting: *Deleted

## 2015-02-21 ENCOUNTER — Telehealth: Payer: Self-pay | Admitting: Oncology

## 2015-02-21 ENCOUNTER — Encounter: Payer: Self-pay | Admitting: Oncology

## 2015-02-21 NOTE — Telephone Encounter (Signed)
[employer] FMLA papers returned to [employer] in [place].

## 2015-02-21 NOTE — Progress Notes (Signed)
I faxed fmla forms to [facility] for Frances [name]

## 2015-02-22 ENCOUNTER — Telehealth: Payer: Self-pay | Admitting: *Deleted

## 2015-02-22 NOTE — Telephone Encounter (Signed)
Per Dr. Benay Spice; notified pt that Dr. Benay Spice has heard back from Dr. Reynaldo Minium and she will be getting call from them to move up her appt @ their office.  Pt verbalized understanding and she's "back to where I was with the bloating; top of abdomen is tight"  Reports she keeps her legs elevated as much as possible and has the support stockings on.  Note to Dr. Benay Spice.

## 2015-02-22 NOTE — Telephone Encounter (Signed)
Patient called to say she has not heard anything from [facility] regarding an appointment.  She is also feeling very bloated and having stomach issues like those she had prior to paracentesis last week.  Please call her.

## 2015-02-23 ENCOUNTER — Telehealth: Payer: Self-pay | Admitting: *Deleted

## 2015-02-23 NOTE — Telephone Encounter (Signed)
Pt called informing office that she has an appt @ [facility] tomorrow @ 12:30 "do I need to take my CT on a disc to them?  Also I need a letter stating my feet/ankles/legs are swelling so I can get an adjustable foot rest so I can work"  Note to Dr. Benay Spice.

## 2015-02-23 NOTE — Telephone Encounter (Signed)
Per Dr. Benay Spice; notified pt that she should get copy of CT for [facility] to review; this office already called Radiology and they will have on disc this afternoon or tomorrow Am when pt wants to pick-up.  Also notified her letter for work will take 2-3 day to process and office will call when ready.  Pt verbalized understanding and expressed appreciation for call back.

## 2015-02-25 ENCOUNTER — Encounter: Payer: Self-pay | Admitting: Nurse Practitioner

## 2015-02-28 ENCOUNTER — Telehealth: Payer: Self-pay | Admitting: Oncology

## 2015-02-28 ENCOUNTER — Other Ambulatory Visit: Payer: Self-pay | Admitting: Oncology

## 2015-02-28 ENCOUNTER — Telehealth: Payer: Self-pay | Admitting: *Deleted

## 2015-02-28 ENCOUNTER — Ambulatory Visit (HOSPITAL_COMMUNITY)
Admission: RE | Admit: 2015-02-28 | Discharge: 2015-02-28 | Disposition: A | Discharge status: Home / Self Care | Payer: 59 | Source: Ambulatory Visit | Attending: Oncology | Admitting: Oncology

## 2015-02-28 DIAGNOSIS — C250 Malignant neoplasm of head of pancreas: Secondary | ICD-10-CM

## 2015-02-28 DIAGNOSIS — R188 Other ascites: Secondary | ICD-10-CM | POA: Diagnosis present

## 2015-02-28 NOTE — Telephone Encounter (Signed)
Called and informed patient that letter for adjustable foot rest would be left at front desk for her.  Patient verbalized understanding.

## 2015-02-28 NOTE — Telephone Encounter (Signed)
Called and left message with 3/24 appointments  anne

## 2015-02-28 NOTE — Telephone Encounter (Signed)
Patient called to say she has increased bloating and abdominal pain over the weekend that was almost unbearable.  She wants to have "the fluid drained" again.  She saw Dr. Verlan Friends (?) last week at [facility] and has decided she does not want to participate in the clinical trial at this time and would like to get started with treatment here asap.

## 2015-02-28 NOTE — Telephone Encounter (Addendum)
Message from Nada Maclachlan with [facility] Support Team.  "Patient has signed back into our program.  I would like to introduce myself and get a better understanding of her treatmemt plan after scans and be better able to support her.  Please call [phone] ext [phone] or fax report if you prefer to fax to [phone]."  Will notify providers.

## 2015-02-28 NOTE — Telephone Encounter (Signed)
Pt worked in for therapuetic US paracentesis. She understands to report to radiology as soon as possible to be worked in. Informed pt we are scheduling Lab office and chemo for 3/24. She voiced understanding.

## 2015-02-28 NOTE — Telephone Encounter (Signed)
Records faxed as requested for update.

## 2015-03-03 ENCOUNTER — Other Ambulatory Visit: Payer: Self-pay | Admitting: *Deleted

## 2015-03-03 ENCOUNTER — Ambulatory Visit (HOSPITAL_BASED_OUTPATIENT_CLINIC_OR_DEPARTMENT_OTHER): Payer: 59

## 2015-03-03 ENCOUNTER — Other Ambulatory Visit (HOSPITAL_BASED_OUTPATIENT_CLINIC_OR_DEPARTMENT_OTHER): Payer: 59

## 2015-03-03 ENCOUNTER — Ambulatory Visit (HOSPITAL_BASED_OUTPATIENT_CLINIC_OR_DEPARTMENT_OTHER): Payer: 59 | Admitting: Oncology

## 2015-03-03 ENCOUNTER — Telehealth: Payer: Self-pay | Admitting: *Deleted

## 2015-03-03 VITALS — BP 136/94 | HR 111 | Temp 98.0°F | Resp 18 | Ht 64.0 in | Wt 178.4 lb

## 2015-03-03 DIAGNOSIS — D638 Anemia in other chronic diseases classified elsewhere: Secondary | ICD-10-CM

## 2015-03-03 DIAGNOSIS — C786 Secondary malignant neoplasm of retroperitoneum and peritoneum: Secondary | ICD-10-CM

## 2015-03-03 DIAGNOSIS — C250 Malignant neoplasm of head of pancreas: Secondary | ICD-10-CM

## 2015-03-03 DIAGNOSIS — G893 Neoplasm related pain (acute) (chronic): Secondary | ICD-10-CM

## 2015-03-03 DIAGNOSIS — Z5111 Encounter for antineoplastic chemotherapy: Secondary | ICD-10-CM | POA: Diagnosis not present

## 2015-03-03 DIAGNOSIS — D6481 Anemia due to antineoplastic chemotherapy: Secondary | ICD-10-CM

## 2015-03-03 DIAGNOSIS — I10 Essential (primary) hypertension: Secondary | ICD-10-CM

## 2015-03-03 DIAGNOSIS — K769 Liver disease, unspecified: Secondary | ICD-10-CM

## 2015-03-03 LAB — COMPREHENSIVE METABOLIC PANEL (CC13)
ALK PHOS: 758 U/L — AB (ref 40–150)
ALT: 32 U/L (ref 0–55)
ANION GAP: 9 meq/L (ref 3–11)
AST: 86 U/L — AB (ref 5–34)
Albumin: 2 g/dL — ABNORMAL LOW (ref 3.5–5.0)
BILIRUBIN TOTAL: 1.03 mg/dL (ref 0.20–1.20)
BUN: 13 mg/dL (ref 7.0–26.0)
CO2: 23 mEq/L (ref 22–29)
CREATININE: 1.2 mg/dL — AB (ref 0.6–1.1)
Calcium: 8.3 mg/dL — ABNORMAL LOW (ref 8.4–10.4)
Chloride: 104 mEq/L (ref 98–109)
EGFR: 61 mL/min/{1.73_m2} — ABNORMAL LOW (ref >90)
Glucose: 91 mg/dl (ref 70–140)
Potassium: 3.8 mEq/L (ref 3.5–5.1)
Sodium: 136 mEq/L (ref 136–145)
Total Protein: 5.7 g/dL — ABNORMAL LOW (ref 6.4–8.3)

## 2015-03-03 LAB — CBC WITH DIFFERENTIAL/PLATELET
BASO%: 0.7 % (ref 0.0–2.0)
Basophils Absolute: 0 10*3/uL (ref 0.0–0.1)
EOS%: 1.3 % (ref 0.0–7.0)
Eosinophils Absolute: 0.1 10*3/uL (ref 0.0–0.5)
HCT: 31.2 % — ABNORMAL LOW (ref 34.8–46.6)
HGB: 9.9 g/dL — ABNORMAL LOW (ref 11.6–15.9)
LYMPH%: 6.8 % — AB (ref 14.0–49.7)
MCH: 25.8 pg (ref 25.1–34.0)
MCHC: 31.8 g/dL (ref 31.5–36.0)
MCV: 81 fL (ref 79.5–101.0)
MONO#: 0.8 10*3/uL (ref 0.1–0.9)
MONO%: 11.8 % (ref 0.0–14.0)
NEUT#: 5.1 10*3/uL (ref 1.5–6.5)
NEUT%: 79.4 % — ABNORMAL HIGH (ref 38.4–76.8)
Platelets: 378 10*3/uL (ref 145–400)
RBC: 3.85 10*6/uL (ref 3.70–5.45)
RDW: 16.6 % — AB (ref 11.2–14.5)
WBC: 6.4 10*3/uL (ref 3.9–10.3)
lymph#: 0.4 10*3/uL — ABNORMAL LOW (ref 0.9–3.3)

## 2015-03-03 LAB — CEA: CEA: 288.5 ng/mL — ABNORMAL HIGH (ref 0.0–5.0)

## 2015-03-03 MED ORDER — FLUOROURACIL CHEMO INJECTION 5 GM/100ML
2400.0000 mg/m2 | INTRAVENOUS | Status: DC
  Administered 2015-03-03: 4600 mg via INTRAVENOUS
  Filled 2015-03-03: qty 92

## 2015-03-03 MED ORDER — PALONOSETRON HCL INJECTION 0.25 MG/5ML
0.2500 mg | Freq: Once | INTRAVENOUS | Status: AC
Start: 2015-03-03 — End: 2015-03-03
  Administered 2015-03-03: 0.25 mg via INTRAVENOUS

## 2015-03-03 MED ORDER — DEXTROSE 5 % IV SOLN
140.0000 mg/m2 | Freq: Once | INTRAVENOUS | Status: AC
  Administered 2015-03-03: 268 mg via INTRAVENOUS
  Filled 2015-03-03: qty 13.4

## 2015-03-03 MED ORDER — OXALIPLATIN CHEMO INJECTION 100 MG/20ML
65.0000 mg/m2 | Freq: Once | INTRAVENOUS | Status: AC
  Administered 2015-03-03: 125 mg via INTRAVENOUS
  Filled 2015-03-03: qty 25

## 2015-03-03 MED ORDER — OXYCODONE-ACETAMINOPHEN 5-325 MG PO TABS: 1.0000 | ORAL_TABLET | ORAL | Status: DC | PRN

## 2015-03-03 MED ORDER — SODIUM CHLORIDE 0.9 % IJ SOLN
10.0000 mL | INTRAMUSCULAR | Status: DC | PRN
  Filled 2015-03-03: qty 10

## 2015-03-03 MED ORDER — ATROPINE SULFATE 1 MG/ML IJ SOLN
0.5000 mg | Freq: Once | INTRAMUSCULAR | Status: AC | PRN
  Administered 2015-03-03: 0.5 mg via INTRAVENOUS

## 2015-03-03 MED ORDER — HEPARIN SOD (PORK) LOCK FLUSH 100 UNIT/ML IV SOLN
500.0000 [IU] | Freq: Once | INTRAVENOUS | Status: DC | PRN
  Filled 2015-03-03: qty 5

## 2015-03-03 MED ORDER — LEUCOVORIN CALCIUM INJECTION 350 MG
400.0000 mg/m2 | Freq: Once | INTRAVENOUS | Status: AC
  Administered 2015-03-03: 768 mg via INTRAVENOUS
  Filled 2015-03-03: qty 38.4

## 2015-03-03 MED ORDER — ATROPINE SULFATE 1 MG/ML IJ SOLN
INTRAMUSCULAR | Status: AC
  Filled 2015-03-03: qty 1

## 2015-03-03 MED ORDER — PALONOSETRON HCL INJECTION 0.25 MG/5ML
INTRAVENOUS | Status: AC
  Filled 2015-03-03: qty 5

## 2015-03-03 MED ORDER — DEXTROSE 5 % IV SOLN
Freq: Once | INTRAVENOUS | Status: AC
  Administered 2015-03-03: 11:00:00 via INTRAVENOUS

## 2015-03-03 MED ORDER — SODIUM CHLORIDE 0.9 % IV SOLN
Freq: Once | INTRAVENOUS | Status: AC
  Administered 2015-03-03: 11:00:00 via INTRAVENOUS
  Filled 2015-03-03: qty 2

## 2015-03-03 NOTE — Patient Instructions (Signed)
[place] Discharge Instructions for Patients Receiving Chemotherapy  Today you received the following chemotherapy agents oxaliplatin, leucovorin, 68fu, irinotecan To help prevent nausea and vomiting after your treatment, we encourage you to take your nausea medication as prescribed. If you develop nausea and vomiting that is not controlled by your nausea medication, call the clinic.   BELOW ARE SYMPTOMS THAT SHOULD BE REPORTED IMMEDIATELY:  *FEVER GREATER THAN 100.5 F  *CHILLS WITH OR WITHOUT FEVER  NAUSEA AND VOMITING THAT IS NOT CONTROLLED WITH YOUR NAUSEA MEDICATION  *UNUSUAL SHORTNESS OF BREATH  *UNUSUAL BRUISING OR BLEEDING  TENDERNESS IN MOUTH AND THROAT WITH OR WITHOUT PRESENCE OF ULCERS  *URINARY PROBLEMS  *BOWEL PROBLEMS  UNUSUAL RASH Items with * indicate a potential emergency and should be followed up as soon as possible.  Feel free to call the clinic you have any questions or concerns. The clinic phone number is [phone].  Please show the [place] at check-in to the Emergency Department and triage nurse.

## 2015-03-03 NOTE — Progress Notes (Signed)
[place] OFFICE PROGRESS NOTE   Diagnosis: Pancreas cancer  INTERVAL HISTORY:   Frances [name] saw Dr. Reynaldo Minium. She declined enrollment on an immunotherapy trial at [facility]. She has decided to proceed with salvage FOLFIRINOX chemotherapy here. She continues to have abdominal distention and pain. No further bleeding. She underwent a therapeutic paracentesis for 5 L of fluid on 02/28/2015. She reports improvement in the abdominal distention following this procedure. She is considering whether to stop working.  Objective:  Vital signs in last 24 hours:  Blood pressure 136/94, pulse 111, temperature 98 F (36.7 C), temperature source Oral, resp. rate 18, height 5\' 4"  (1.626 m), weight 178 lb 6.4 oz (80.922 kg), SpO2 100 %.    HEENT: No thrush or ulcer Resp: Lungs clear bilaterally Cardio: Regular rate and rhythm GI: Distended with firm masslike fullness in the low abdomen bilaterally Vascular: 1-2+ pitting edema below the knee bilaterally   Portacath/PICC-without erythema  Lab Results:  Lab Results  Component Value Date   WBC 6.4 03/03/2015   HGB 9.9* 03/03/2015   HCT 31.2* 03/03/2015   MCV 81.0 03/03/2015   PLT 378 03/03/2015   NEUTROABS 5.1 03/03/2015    Lab Results  Component Value Date   NA 136 03/03/2015    Lab Results  Component Value Date   CEA 288.5* 03/03/2015    Imaging:  US Paracentesis  02/28/2015   INDICATION: Ascites; pancreatic cancer  EXAM: ULTRASOUND-GUIDED PARACENTESIS  COMPARISON:  None.  MEDICATIONS: 10 cc 1% lidocaine  COMPLICATIONS: None immediate  TECHNIQUE: Informed written consent was obtained from the patient after a discussion of the risks, benefits and alternatives to treatment. A timeout was performed prior to the initiation of the procedure.  Initial ultrasound scanning demonstrates a large amount of ascites within the left lower abdominal quadrant. The left lower abdomen was prepped and draped in the usual sterile  fashion. 1% lidocaine with epinephrine was used for local anesthesia. Under direct ultrasound guidance, a 19 gauge, 7-cm, Yueh catheter was introduced. An ultrasound image was saved for documentation purposed. the paracentesis was performed. The catheter was removed and a dressing was applied. The patient tolerated the procedure well without immediate post procedural complication.  FINDINGS: A total of approximately 5 liters of dark yellow fluid was removed.  IMPRESSION: Successful ultrasound-guided paracentesis yielding 5 liters of peritoneal fluid.  Read by:  Lavonia Drafts [facility]   Electronically Signed   By: Jacqulynn Cadet M.D.   On: 02/28/2015 16:26    Medications: I have reviewed the patient's current medications.  Assessment/Plan: 1. 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 Initiation of FOLFIRINOX 10/29/2013. She did not complete the 5-FU pump due to developing altered speech. Cycle 2 completed 11/12/2013. Cycle 3 completed 11/26/2013. Cycle 4 completed 12/09/2013, cycle 5 12/24/2013, cycle 6 01/14/2014, cycle 7 01/28/2014, cycle 8 02/11/2014, cycle 9 02/25/2014, cycle 10 03/11/2014  Restaging CT 01/06/2012 with a slight increase in the size of the pancreas mass and no clear evidence of distant metastatic disease.   CEA improved 01/28/2014.   Cycle 1 of modified FOLFIRI 03/25/2014.   Cycle 2 modified FOLFIRI 04/08/2014.   CEA stable 04/26/2014.   Restaging CT abdomen/pelvis 04/26/2014 with interval decrease in the size of the pancreatic head mass. Stable mesenteric and retroperitoneal lymphadenopathy. Stable splenomegaly. 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 Stable small liver lesions consistent with benign cysts.   Continuation of FOLFIRI on a 3 week schedule.  CEA increased (13.8) on 06/03/2014.   CEA increased (23.3) on 07/08/2014.   Restaging CT evaluation 08/03/2014 with mild interval enlargement  of the pancreatic head mass. Slight increase in the size of small central mesenteric lymph nodes. 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 Common bile duct stent in place without evidence of biliary duct dilatation. Stable small hypodensities within the liver.   Initiation of concurrent capecitabine and radiation 08/19/2014, completed 09/24/2014  CEA mildly improved on 10/18/2014 (19.9).  Restaging CT 11/25/2014 with an enlarged pancreas body mass, new liver lesions, retroperitoneal soft tissue mass obstructing the left ureter, new ascites, loculated peritoneal fluid superior to the bladder  Initiation of gemcitabine/Abraxane day one/day 8 of a 21 day cycle 12/08/2014.  Day 8 treatment held on 12/16/2014 due to neutropenia and thrombocytopenia.  Cycle 2 gemcitabine/Abraxane day 1 12/24/2014.  Cycle 2 day 8 held on 12/31/2014 due to neutropenia  Gemcitabine/Abraxane schedule adjusted to every 2 weeks beginning 01/07/2015. Last given 02/04/2015  CT 02/11/2015 confirmed progression of a cystic/solid anterior peritoneal mass, progressive liver lesions, and suspicion for a metastasis at L1  Paracentesis 02/16/2015 with cytology confirming metastatic adenocarcinoma  2. Altered speech prior to completion of cycle 1 of FOLFIRINOX, similar symptoms and tongue/lip twitching following the oxaliplatin with cycle 2 FOLFIRINOX. Oxaliplatin was dose reduced and given over 4 hours beginning with cycle 3. She had similar neurologic symptoms after subsequent cycles of FOLFIRINOX. 3. Status post cholecystectomy 10/02/2013 with pathology revealing chronic cholecystitis. 4. Abdomen/back pain secondary to #1. Improved following Xeloda/radiation 5. Hypertension. 6. Status post Port-A-Cath placement 10/28/2013. 7. History of elevated liver enzyme-improved. 8. History of hypokalemia.  9. History of neutropenia secondary to chemotherapy. Neulasta was added  beginning with cycle 6 of FOLFIRINOX on 01/14/2014. 10. Delayed nausea. Improved with the addition of Aloxi. 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 12. Low back pain radiating to the left lateral leg status post negative plain x-ray evaluation 11/01/2014. 13. Left hydronephrosis and hydroureter secondary to a retroperitoneal soft tissue mass on the CT 11/25/2014.   Status post left double-J stent placement 12/13/2014 by Dr. Risa Grill. 14. Mild elevation of the creatinine  15. Loose stools after meals. Question pancreatic insufficiency. Pancreatic enzyme replacement initiated 12/31/2014. Improved. 16. History of Neutropenia related to chemotherapy 17. Anemia secondary to chronic disease, chemotherapy, and rectal bleeding-status post a red cell transfusion 02/11/2015   Disposition:  Frances [name] has progressive metastatic pancreas cancer. She saw Dr. Reynaldo Minium 2 weeks ago and decided against enrollment on clinical trial at [facility]. We discussed treatment options. She last received oxaliplatin chemotherapy in early Caci 2015. She could respond to repeat treatment with FOLFIRINOX. The plan is to proceed with FOLFIRINOX chemotherapy today. We reviewed potential toxicities associated with this regimen including the chance for diarrhea, progressive neuropathy symptoms, and an allergic reaction. She agrees to proceed.  Betsy Coder, MD  03/03/2015  8:10 PM

## 2015-03-03 NOTE — [facility] Oncology Navigator Note (Signed)
Met with Frances [name] and her husband during pre-treatment office visit. Reviewed side effects of her regimen and provided verbal and written instructions on Imodium use for Camptosar. She will also ask for her Atropine today, based on past treatment. She has her antiemetics from prior therapy and understands how to take them in regards to aloxi.

## 2015-03-03 NOTE — Telephone Encounter (Signed)
Called HIM spoke with Frances [name], she will print pt's medical records for pt to submit with disability claim. Pt will be in infusion for about 7 hours today.

## 2015-03-04 ENCOUNTER — Ambulatory Visit (HOSPITAL_COMMUNITY)
Admission: RE | Admit: 2015-03-04 | Discharge: 2015-03-04 | Disposition: A | Discharge status: Home / Self Care | Payer: 59 | Source: Ambulatory Visit | Attending: Oncology | Admitting: Oncology

## 2015-03-04 ENCOUNTER — Telehealth: Payer: Self-pay | Admitting: *Deleted

## 2015-03-04 DIAGNOSIS — C250 Malignant neoplasm of head of pancreas: Secondary | ICD-10-CM

## 2015-03-04 DIAGNOSIS — R188 Other ascites: Secondary | ICD-10-CM | POA: Insufficient documentation

## 2015-03-04 MED ORDER — LIDOCAINE HCL (PF) 1 % IJ SOLN
INTRAMUSCULAR | Status: AC
  Filled 2015-03-04: qty 10

## 2015-03-04 NOTE — Telephone Encounter (Addendum)
Called ultrasound dept. They can work pt in for paracentesis at [employer] today at [facility]. Called pt with appt. She reports abdominal pain is slightly improved after her third dose of pain med today.

## 2015-03-04 NOTE — Addendum Note (Signed)
Addended by: Brien Few on: 03/04/2015 11:24 AM   Modules accepted: Orders

## 2015-03-04 NOTE — Procedures (Signed)
Successful US guided paracentesis from [facility].  Yielded 4.8L of clear yellow fluid.  No immediate complications.  Pt tolerated well.   Specimen was not sent for labs.  Ascencion Dike PA-C 03/04/2015 3:19 PM

## 2015-03-04 NOTE — Telephone Encounter (Signed)
Per staff message and POF I have scheduled appts. Advised scheduler of appts and MD on 4/21 to late for 5 hr treatment. Patient needs to be in chemo by 1230. JMW

## 2015-03-04 NOTE — Telephone Encounter (Addendum)
PT. NEEDS FLUID DRAWN OFF HER ABDOMEN TODAY. THIS NOTE WAS ROUTED TO DR.Maloy.

## 2015-03-05 ENCOUNTER — Ambulatory Visit: Payer: 59

## 2015-03-05 ENCOUNTER — Ambulatory Visit (HOSPITAL_BASED_OUTPATIENT_CLINIC_OR_DEPARTMENT_OTHER): Payer: 59

## 2015-03-05 DIAGNOSIS — Z5189 Encounter for other specified aftercare: Secondary | ICD-10-CM | POA: Diagnosis not present

## 2015-03-05 DIAGNOSIS — C250 Malignant neoplasm of head of pancreas: Secondary | ICD-10-CM

## 2015-03-05 MED ORDER — PEGFILGRASTIM INJECTION 6 MG/0.6ML ~~LOC~~
6.0000 mg | PREFILLED_SYRINGE | Freq: Once | SUBCUTANEOUS | Status: AC
  Administered 2015-03-05: 6 mg via SUBCUTANEOUS

## 2015-03-05 MED ORDER — SODIUM CHLORIDE 0.9 % IJ SOLN
10.0000 mL | INTRAMUSCULAR | Status: DC | PRN
  Administered 2015-03-05: 10 mL
  Filled 2015-03-05: qty 10

## 2015-03-05 MED ORDER — HEPARIN SOD (PORK) LOCK FLUSH 100 UNIT/ML IV SOLN
500.0000 [IU] | Freq: Once | INTRAVENOUS | Status: AC | PRN
  Administered 2015-03-05: 500 [IU]
  Filled 2015-03-05: qty 5

## 2015-03-05 NOTE — Progress Notes (Signed)
Duplicate encounter

## 2015-03-07 ENCOUNTER — Telehealth: Payer: Self-pay | Admitting: *Deleted

## 2015-03-07 NOTE — Telephone Encounter (Signed)
Per staff message and POF I have scheduled appts. Advised scheduler of appts. JMW  

## 2015-03-09 ENCOUNTER — Ambulatory Visit (HOSPITAL_COMMUNITY)
Admission: RE | Admit: 2015-03-09 | Discharge: 2015-03-09 | Disposition: A | Discharge status: Home / Self Care | Payer: 59 | Source: Ambulatory Visit | Attending: Interventional Radiology | Admitting: Interventional Radiology

## 2015-03-09 ENCOUNTER — Other Ambulatory Visit: Payer: Self-pay | Admitting: *Deleted

## 2015-03-09 ENCOUNTER — Telehealth: Payer: Self-pay | Admitting: *Deleted

## 2015-03-09 DIAGNOSIS — C250 Malignant neoplasm of head of pancreas: Secondary | ICD-10-CM

## 2015-03-09 DIAGNOSIS — R188 Other ascites: Secondary | ICD-10-CM | POA: Insufficient documentation

## 2015-03-09 NOTE — Telephone Encounter (Signed)
Called pt at home and gave pt appt time for today - arriving to radiology at 130 pm for procedure at 2 pm.  Pt denied shortness of breath, stating abdominal pain and feeling fullness.

## 2015-03-09 NOTE — Procedures (Signed)
Successful US guided paracentesis from [facility].  Yielded 4.9L of clear yellow fluid.  No immediate complications.  Pt tolerated well.   Specimen was not sent for labs.  Ascencion Dike PA-C 03/09/2015 4:31 PM

## 2015-03-09 NOTE — Telephone Encounter (Signed)
Received call from husband Isla Sabree requesting an appt for pt to have paracentesis done today.  Message to Dr. Benay Spice and Amy, desk nurse today. Pt's  Phone   [phone]

## 2015-03-13 ENCOUNTER — Other Ambulatory Visit: Payer: Self-pay | Admitting: Oncology

## 2015-03-14 ENCOUNTER — Telehealth: Payer: Self-pay | Admitting: *Deleted

## 2015-03-14 ENCOUNTER — Telehealth: Payer: Self-pay | Admitting: Oncology

## 2015-03-14 NOTE — Telephone Encounter (Signed)
Per provider schedule MD visit on 04/05 leave labs/chemo on 04/07..... Sent pt a msg through my chart.... KJ

## 2015-03-14 NOTE — Telephone Encounter (Signed)
Called pt with appointment for 03/15/15. Pt can not be here by 0815. Appt given for 0845. Will leave lab and infusion on 4/7 as scheduled.

## 2015-03-15 ENCOUNTER — Telehealth: Payer: Self-pay | Admitting: Oncology

## 2015-03-15 ENCOUNTER — Ambulatory Visit (HOSPITAL_COMMUNITY)
Admission: RE | Admit: 2015-03-15 | Discharge: 2015-03-15 | Disposition: A | Discharge status: Home / Self Care | Payer: 59 | Source: Ambulatory Visit | Attending: Oncology | Admitting: Oncology

## 2015-03-15 ENCOUNTER — Telehealth: Payer: Self-pay | Admitting: *Deleted

## 2015-03-15 ENCOUNTER — Ambulatory Visit (HOSPITAL_BASED_OUTPATIENT_CLINIC_OR_DEPARTMENT_OTHER): Payer: 59 | Admitting: Oncology

## 2015-03-15 VITALS — BP 119/86 | HR 113 | Temp 98.2°F | Resp 17 | Ht 64.0 in | Wt 165.2 lb

## 2015-03-15 DIAGNOSIS — G893 Neoplasm related pain (acute) (chronic): Secondary | ICD-10-CM | POA: Diagnosis not present

## 2015-03-15 DIAGNOSIS — D500 Iron deficiency anemia secondary to blood loss (chronic): Secondary | ICD-10-CM

## 2015-03-15 DIAGNOSIS — D6481 Anemia due to antineoplastic chemotherapy: Secondary | ICD-10-CM

## 2015-03-15 DIAGNOSIS — K769 Liver disease, unspecified: Secondary | ICD-10-CM | POA: Diagnosis not present

## 2015-03-15 DIAGNOSIS — C786 Secondary malignant neoplasm of retroperitoneum and peritoneum: Secondary | ICD-10-CM

## 2015-03-15 DIAGNOSIS — R188 Other ascites: Secondary | ICD-10-CM | POA: Diagnosis present

## 2015-03-15 DIAGNOSIS — C250 Malignant neoplasm of head of pancreas: Secondary | ICD-10-CM

## 2015-03-15 DIAGNOSIS — D638 Anemia in other chronic diseases classified elsewhere: Secondary | ICD-10-CM

## 2015-03-15 DIAGNOSIS — I10 Essential (primary) hypertension: Secondary | ICD-10-CM

## 2015-03-15 MED ORDER — OXYCODONE-ACETAMINOPHEN 5-325 MG PO TABS: 1.0000 | ORAL_TABLET | ORAL | Status: AC | PRN

## 2015-03-15 NOTE — Telephone Encounter (Signed)
Per staff message and POF I have scheduled appts. Advised scheduler of appts. JMW  

## 2015-03-15 NOTE — Procedures (Signed)
Ultrasound-guided therapeutic paracentesis performed yielding 4.2 L yellow fluid. No immediate complications.

## 2015-03-15 NOTE — Telephone Encounter (Signed)
Gave avs & calendar for Frances [name]. Sent message to adjust treatment. °

## 2015-03-15 NOTE — Progress Notes (Signed)
Frances [name] OFFICE PROGRESS NOTE   Diagnosis: Pancreas cancer  INTERVAL HISTORY:   Frances [name] completed a cycle of FOLFIRINOX 03/03/2015. She reports developing leg cramps and increased abdominal pain during the FOLFIRINOX infusion. No neuropathy symptoms. She continues to have early satiety, abdominal bloating, and abdominal pain. The pain is relieved with oxycodone. She gets transient relief of the abdominal distention with paracentesis procedures. She last underwent a paracentesis on 03/09/2015. 4.9 L of fluid were removed. She feels like she needs a paracentesis again today.  Objective:  Vital signs in last 24 hours:  Blood pressure 119/86, pulse 113, temperature 98.2 F (36.8 C), temperature source Oral, resp. rate 17, height 5\' 4"  (1.626 m), weight 165 lb 3.2 oz (74.934 kg), SpO2 100 %.    HEENT: No thrush or ulcers Resp: Lungs clear bilaterally Cardio: Regular rate and rhythm GI: The abdomen is distended with firm fullness throughout the low abdomen, 2 cm right upper abdominal wall mass Vascular: Trace ankle edema bilaterally Neuro: The vibratory sense is intact at the fingertips bilaterally    Portacath/PICC-without erythema  Lab Results:  Lab Results  Component Value Date   WBC 6.4 03/03/2015   HGB 9.9* 03/03/2015   HCT 31.2* 03/03/2015   MCV 81.0 03/03/2015   PLT 378 03/03/2015   NEUTROABS 5.1 03/03/2015     Lab Results  Component Value Date   CEA 288.5* 03/03/2015    Medications: I have reviewed the patient's current medications.  Assessment/Plan: 1. 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 Initiation of FOLFIRINOX 10/29/2013. She did not complete the 5-FU pump due to developing altered speech. Cycle 2 completed 11/12/2013. Cycle 3 completed 11/26/2013. Cycle 4 completed 12/09/2013, cycle 5 12/24/2013, cycle 6 01/14/2014, cycle 7 01/28/2014, cycle 8 02/11/2014, cycle 9 02/25/2014, cycle 10 03/11/2014  Restaging CT  01/06/2012 with a slight increase in the size of the pancreas mass and no clear evidence of distant metastatic disease.   CEA improved 01/28/2014.   Cycle 1 of modified FOLFIRI 03/25/2014.   Cycle 2 modified FOLFIRI 04/08/2014.   CEA stable 04/26/2014.   Restaging CT abdomen/pelvis 04/26/2014 with interval decrease in the size of the pancreatic head mass. Stable mesenteric and retroperitoneal lymphadenopathy. Stable splenomegaly. 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 Stable small liver lesions consistent with benign cysts.   Continuation of FOLFIRI on a 3 week schedule.   CEA increased (13.8) on 06/03/2014.   CEA increased (23.3) on 07/08/2014.   Restaging CT evaluation 08/03/2014 with mild interval enlargement of the pancreatic head mass. Slight increase in the size of small central mesenteric lymph nodes. 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 Common bile duct stent in place without evidence of biliary duct dilatation. Stable small hypodensities within the liver.   Initiation of concurrent capecitabine and radiation 08/19/2014, completed 09/24/2014  CEA mildly improved on 10/18/2014 (19.9).  Restaging CT 11/25/2014 with an enlarged pancreas body mass, new liver lesions, retroperitoneal soft tissue mass obstructing the left ureter, new ascites, loculated peritoneal fluid superior to the bladder  Initiation of gemcitabine/Abraxane day one/day 8 of a 21 day cycle 12/08/2014.  Day 8 treatment held on 12/16/2014 due to neutropenia and thrombocytopenia.  Cycle 2 gemcitabine/Abraxane day 1 12/24/2014.  Cycle 2 day 8 held on 12/31/2014 due to neutropenia  Gemcitabine/Abraxane schedule adjusted to every 2 weeks beginning 01/07/2015. Last given 02/04/2015  CT 02/11/2015 confirmed progression of a cystic/solid anterior peritoneal mass,  progressive liver lesions, and suspicion  for a metastasis at L1  Paracentesis 02/16/2015 with cytology confirming metastatic adenocarcinoma  Salvage therapy with FOLFIRINOX initiated 03/03/2015  2. Altered speech prior to completion of cycle 1 of FOLFIRINOX, similar symptoms and tongue/lip twitching following the oxaliplatin with cycle 2 FOLFIRINOX. Oxaliplatin was dose reduced and given over 4 hours beginning with cycle 3. She had similar neurologic symptoms after subsequent cycles of FOLFIRINOX. 3. Status post cholecystectomy 10/02/2013 with pathology revealing chronic cholecystitis. 4. Abdomen/back pain secondary to #1. Improved following Xeloda/radiation 5. Hypertension. 6. Status post Port-A-Cath placement 10/28/2013. 7. History of elevated liver enzyme-improved. 8. History of hypokalemia.  9. History of neutropenia secondary to chemotherapy. Neulasta was added beginning with cycle 6 of FOLFIRINOX on 01/14/2014. 10. Delayed nausea. Improved with the addition of Aloxi. 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 12. Low back pain radiating to the left lateral leg status post negative plain x-ray evaluation 11/01/2014. 13. Left hydronephrosis and hydroureter secondary to a retroperitoneal soft tissue mass on the CT 11/25/2014.   Status post left double-J stent placement 12/13/2014 by Dr. Risa Grill. 14. Mild elevation of the creatinine  15. Loose stools after meals. Question pancreatic insufficiency. Pancreatic enzyme replacement initiated 12/31/2014. Improved. 16. Anemia secondary to chronic disease, chemotherapy, and rectal bleeding-status post a red cell transfusion 02/11/2015  Disposition:  She tolerated the FOLFIRINOX well. She will return for another cycle on 03/17/2015. We will arrange for a therapeutic paracentesis today. Frances [name] will continue oxycodone as needed for pain. She will return for an office visit and cycle 3 of salvage FOLFIRINOX on 03/31/2015. We will arrange for paracentesis procedures in the interim as  needed.  Betsy Coder, MD  03/15/2015  9:20 AM

## 2015-03-17 ENCOUNTER — Ambulatory Visit (HOSPITAL_BASED_OUTPATIENT_CLINIC_OR_DEPARTMENT_OTHER): Payer: 59

## 2015-03-17 ENCOUNTER — Other Ambulatory Visit (HOSPITAL_BASED_OUTPATIENT_CLINIC_OR_DEPARTMENT_OTHER): Payer: 59

## 2015-03-17 ENCOUNTER — Ambulatory Visit: Payer: 59 | Admitting: Oncology

## 2015-03-17 VITALS — BP 112/86 | HR 117 | Temp 98.2°F

## 2015-03-17 DIAGNOSIS — Z5111 Encounter for antineoplastic chemotherapy: Secondary | ICD-10-CM | POA: Diagnosis not present

## 2015-03-17 DIAGNOSIS — C250 Malignant neoplasm of head of pancreas: Secondary | ICD-10-CM | POA: Diagnosis not present

## 2015-03-17 DIAGNOSIS — C786 Secondary malignant neoplasm of retroperitoneum and peritoneum: Secondary | ICD-10-CM | POA: Diagnosis not present

## 2015-03-17 LAB — COMPREHENSIVE METABOLIC PANEL (CC13)
ALBUMIN: 1.6 g/dL — AB (ref 3.5–5.0)
ALT: 35 U/L (ref 0–55)
ANION GAP: 9 meq/L (ref 3–11)
AST: 73 U/L — ABNORMAL HIGH (ref 5–34)
Alkaline Phosphatase: 1165 U/L — ABNORMAL HIGH (ref 40–150)
BUN: 20.7 mg/dL (ref 7.0–26.0)
CALCIUM: 8.1 mg/dL — AB (ref 8.4–10.4)
CO2: 20 mEq/L — ABNORMAL LOW (ref 22–29)
CREATININE: 1.8 mg/dL — AB (ref 0.6–1.1)
Chloride: 106 mEq/L (ref 98–109)
EGFR: 39 mL/min/{1.73_m2} — ABNORMAL LOW (ref >90)
Glucose: 121 mg/dl (ref 70–140)
Potassium: 4.3 mEq/L (ref 3.5–5.1)
Sodium: 135 mEq/L — ABNORMAL LOW (ref 136–145)
Total Bilirubin: 0.45 mg/dL (ref 0.20–1.20)
Total Protein: 5.2 g/dL — ABNORMAL LOW (ref 6.4–8.3)

## 2015-03-17 LAB — CBC WITH DIFFERENTIAL/PLATELET
BASO%: 0.2 % (ref 0.0–2.0)
Basophils Absolute: 0 10*3/uL (ref 0.0–0.1)
EOS ABS: 0.1 10*3/uL (ref 0.0–0.5)
EOS%: 0.4 % (ref 0.0–7.0)
HCT: 29.2 % — ABNORMAL LOW (ref 34.8–46.6)
HGB: 9.5 g/dL — ABNORMAL LOW (ref 11.6–15.9)
LYMPH%: 5.5 % — AB (ref 14.0–49.7)
MCH: 26.2 pg (ref 25.1–34.0)
MCHC: 32.5 g/dL (ref 31.5–36.0)
MCV: 80.4 fL (ref 79.5–101.0)
MONO#: 1.2 10*3/uL — ABNORMAL HIGH (ref 0.1–0.9)
MONO%: 8.6 % (ref 0.0–14.0)
NEUT#: 12.2 10*3/uL — ABNORMAL HIGH (ref 1.5–6.5)
NEUT%: 85.3 % — ABNORMAL HIGH (ref 38.4–76.8)
Platelets: 384 10*3/uL (ref 145–400)
RBC: 3.63 10*6/uL — AB (ref 3.70–5.45)
RDW: 18.5 % — AB (ref 11.2–14.5)
WBC: 14.3 10*3/uL — ABNORMAL HIGH (ref 3.9–10.3)
lymph#: 0.8 10*3/uL — ABNORMAL LOW (ref 0.9–3.3)
nRBC: 0 % (ref 0–0)

## 2015-03-17 LAB — TECHNOLOGIST REVIEW

## 2015-03-17 MED ORDER — SODIUM CHLORIDE 0.9 % IV SOLN
2400.0000 mg/m2 | INTRAVENOUS | Status: DC
  Administered 2015-03-17: 4600 mg via INTRAVENOUS
  Filled 2015-03-17: qty 92

## 2015-03-17 MED ORDER — ATROPINE SULFATE 1 MG/ML IJ SOLN
0.5000 mg | Freq: Once | INTRAMUSCULAR | Status: AC | PRN
  Administered 2015-03-17: 0.5 mg via INTRAVENOUS

## 2015-03-17 MED ORDER — OXALIPLATIN CHEMO INJECTION 100 MG/20ML
65.0000 mg/m2 | Freq: Once | INTRAVENOUS | Status: AC
  Administered 2015-03-17: 125 mg via INTRAVENOUS
  Filled 2015-03-17: qty 25

## 2015-03-17 MED ORDER — LEUCOVORIN CALCIUM INJECTION 350 MG
400.0000 mg/m2 | Freq: Once | INTRAMUSCULAR | Status: AC
  Administered 2015-03-17: 768 mg via INTRAVENOUS
  Filled 2015-03-17: qty 38.4

## 2015-03-17 MED ORDER — PALONOSETRON HCL INJECTION 0.25 MG/5ML
0.2500 mg | Freq: Once | INTRAVENOUS | Status: AC
  Administered 2015-03-17: 0.25 mg via INTRAVENOUS

## 2015-03-17 MED ORDER — PALONOSETRON HCL INJECTION 0.25 MG/5ML
INTRAVENOUS | Status: AC
  Filled 2015-03-17: qty 5

## 2015-03-17 MED ORDER — OXYCODONE-ACETAMINOPHEN 5-325 MG PO TABS
2.0000 | ORAL_TABLET | Freq: Once | ORAL | Status: AC
  Administered 2015-03-17: 2 via ORAL

## 2015-03-17 MED ORDER — OXYCODONE-ACETAMINOPHEN 5-325 MG PO TABS
ORAL_TABLET | ORAL | Status: AC
  Filled 2015-03-17: qty 2

## 2015-03-17 MED ORDER — DEXTROSE 5 % IV SOLN
Freq: Once | INTRAVENOUS | Status: AC
  Administered 2015-03-17: 11:00:00 via INTRAVENOUS

## 2015-03-17 MED ORDER — ATROPINE SULFATE 1 MG/ML IJ SOLN
INTRAMUSCULAR | Status: AC
  Filled 2015-03-17: qty 1

## 2015-03-17 MED ORDER — SODIUM CHLORIDE 0.9 % IV SOLN
Freq: Once | INTRAVENOUS | Status: AC
  Administered 2015-03-17: 11:00:00 via INTRAVENOUS
  Filled 2015-03-17: qty 2

## 2015-03-17 MED ORDER — IRINOTECAN HCL CHEMO INJECTION 100 MG/5ML
140.0000 mg/m2 | Freq: Once | INTRAVENOUS | Status: AC
  Administered 2015-03-17: 268 mg via INTRAVENOUS
  Filled 2015-03-17: qty 13.4

## 2015-03-17 MED ORDER — SODIUM CHLORIDE 0.9 % IV SOLN
Freq: Once | INTRAVENOUS | Status: AC
  Administered 2015-03-17: 10:00:00 via INTRAVENOUS

## 2015-03-17 NOTE — Patient Instructions (Signed)
[facility] Discharge Instructions for Patients Receiving Chemotherapy  Today you received the following chemotherapy agents Oxaliplatin/Irinotecan/Leucovorin/5 FU To help prevent nausea and vomiting after your treatment, we encourage you to take your nausea medication as prescribed.  If you develop nausea and vomiting that is not controlled by your nausea medication, call the clinic.   BELOW ARE SYMPTOMS THAT SHOULD BE REPORTED IMMEDIATELY:  *FEVER GREATER THAN 100.5 F  *CHILLS WITH OR WITHOUT FEVER  NAUSEA AND VOMITING THAT IS NOT CONTROLLED WITH YOUR NAUSEA MEDICATION  *UNUSUAL SHORTNESS OF BREATH  *UNUSUAL BRUISING OR BLEEDING  TENDERNESS IN MOUTH AND THROAT WITH OR WITHOUT PRESENCE OF ULCERS  *URINARY PROBLEMS  *BOWEL PROBLEMS  UNUSUAL RASH Items with * indicate a potential emergency and should be followed up as soon as possible.  Feel free to call the clinic you have any questions or concerns. The clinic phone number is [phone].  Please show the CHEMO ALERT CARD at check-in to the Emergency Department and triage nurse.   

## 2015-03-17 NOTE — Progress Notes (Signed)
Ok to treat with Alkaline Phosphatase 1165 and creatinine 1.8 and pule 117 per Dr. Lindi Adie.   Order given and carried out for 500 mL normal saline over 1 hour per Dr. Lindi Adie.   Patient complains of lower back pain that is shooting and stabbing, patient states this pain is occasional and rates the pain a 4 on the 0 to 10 pain scale. Selena Lesser, NP notified. Order given and carried out for 2 percocet 5/325 mg PO.

## 2015-03-19 ENCOUNTER — Ambulatory Visit (HOSPITAL_BASED_OUTPATIENT_CLINIC_OR_DEPARTMENT_OTHER): Payer: 59

## 2015-03-19 ENCOUNTER — Ambulatory Visit: Payer: 59

## 2015-03-19 DIAGNOSIS — C250 Malignant neoplasm of head of pancreas: Secondary | ICD-10-CM

## 2015-03-19 DIAGNOSIS — Z5189 Encounter for other specified aftercare: Secondary | ICD-10-CM

## 2015-03-19 MED ORDER — HEPARIN SOD (PORK) LOCK FLUSH 100 UNIT/ML IV SOLN
500.0000 [IU] | Freq: Once | INTRAVENOUS | Status: AC | PRN
  Administered 2015-03-19: 500 [IU]
  Filled 2015-03-19: qty 5

## 2015-03-19 MED ORDER — PEGFILGRASTIM INJECTION 6 MG/0.6ML ~~LOC~~
6.0000 mg | PREFILLED_SYRINGE | Freq: Once | SUBCUTANEOUS | Status: AC
  Administered 2015-03-19: 6 mg via SUBCUTANEOUS

## 2015-03-19 MED ORDER — SODIUM CHLORIDE 0.9 % IJ SOLN
10.0000 mL | INTRAMUSCULAR | Status: DC | PRN
  Administered 2015-03-19: 10 mL
  Filled 2015-03-19: qty 10

## 2015-03-22 ENCOUNTER — Other Ambulatory Visit: Payer: Self-pay | Admitting: *Deleted

## 2015-03-22 ENCOUNTER — Telehealth: Payer: Self-pay | Admitting: *Deleted

## 2015-03-22 DIAGNOSIS — C250 Malignant neoplasm of head of pancreas: Secondary | ICD-10-CM

## 2015-03-22 NOTE — Telephone Encounter (Signed)
TC from patient. She states she is retaining fluid and feels that she needs another paracentesis soon. States she is uncomfortable, minimal shortness of breath. Last US guided paracentesis was on 03/15/15.

## 2015-03-22 NOTE — Telephone Encounter (Signed)
Received phone message from patient-she states she has moved around some this am and is feeling more and more uncomfortable and is requesting paracentesis today instead of tomorrow. Call made to Central Scheduling to reschedule for today. No availability today at either [facility] or [facility] Radiology. Central scheduler waiting on call back from PA @ [facility] to see if he will add Frances [name] on to today's schedule.

## 2015-03-22 NOTE — Telephone Encounter (Signed)
Received call back from [facility] availability today for paracentesis. Call back to patient to inform her that there were no openings available today for paracentesis. If the situation becomes more uncomfortable, pt. My go to Emergency Dept., otherwise her appt. Stands for tomorrow as scheduled. Pt. Voiced understanding and will try to wait until tomorrow.

## 2015-03-22 NOTE — Telephone Encounter (Signed)
Spoke with Dr. Benay Spice. Received ok to schedule U/S Paracentesis.  Call made to Central Scheduling for 1st available appt. @  [facility]. Set for 03/23/15 @ 11am with arrival time for 10:45 am.  Call back to pt and notified her of tomorrow's appt. She verbalized understanding.

## 2015-03-23 ENCOUNTER — Ambulatory Visit (HOSPITAL_COMMUNITY)
Admission: RE | Admit: 2015-03-23 | Discharge: 2015-03-23 | Disposition: A | Discharge status: Home / Self Care | Payer: 59 | Source: Ambulatory Visit | Attending: Oncology | Admitting: Oncology

## 2015-03-23 DIAGNOSIS — R188 Other ascites: Secondary | ICD-10-CM | POA: Diagnosis not present

## 2015-03-23 DIAGNOSIS — C250 Malignant neoplasm of head of pancreas: Secondary | ICD-10-CM | POA: Diagnosis present

## 2015-03-23 NOTE — Procedures (Signed)
Successful US guided paracentesis from left mid to lower abdominal region.  Yielded 4.8 liters of serous fluid.  No immediate complications.  Pt tolerated well.   Specimen was not sent for labs.  Tsosie Billing D PA-C 03/23/2015 11:54 AM

## 2015-03-27 ENCOUNTER — Other Ambulatory Visit: Payer: Self-pay | Admitting: Oncology

## 2015-03-29 ENCOUNTER — Telehealth: Payer: Self-pay | Admitting: *Deleted

## 2015-03-29 NOTE — Telephone Encounter (Signed)
TC from patient asking about her appts for Thursday, 03/31/15. She thinks she may need paracentesis on Thursday and wanted to know if she had room in her schedule for this.  Her appts begin @ 8:45 with lab and she sees Dr. Benay Spice and then has chemo treatment. She would be done by around 3p. So there is not much room in her schedule for paracentesis on Thursday. Pt. States she understands this. Advised pt to see how she feels first thing in the morning to see if she feels she needs paracentesis tomorrow or not. Otherwise it will be Friday and she can discuss with Dr. Benay Spice on Thursday.. Pt. Averages paracentesis about every 8 days.

## 2015-03-30 ENCOUNTER — Other Ambulatory Visit: Payer: Self-pay | Admitting: *Deleted

## 2015-03-30 ENCOUNTER — Ambulatory Visit (HOSPITAL_COMMUNITY)
Admission: RE | Admit: 2015-03-30 | Discharge: 2015-03-30 | Disposition: A | Discharge status: Home / Self Care | Payer: 59 | Source: Ambulatory Visit | Attending: Oncology | Admitting: Oncology

## 2015-03-30 ENCOUNTER — Telehealth: Payer: Self-pay | Admitting: *Deleted

## 2015-03-30 DIAGNOSIS — R188 Other ascites: Secondary | ICD-10-CM | POA: Insufficient documentation

## 2015-03-30 DIAGNOSIS — C259 Malignant neoplasm of pancreas, unspecified: Secondary | ICD-10-CM | POA: Diagnosis present

## 2015-03-30 DIAGNOSIS — C250 Malignant neoplasm of head of pancreas: Secondary | ICD-10-CM

## 2015-03-30 NOTE — Procedures (Signed)
Ultrasound-guided therapeutic paracentesis performed yielding 3.6 L yellow fluid. No immediate complications.

## 2015-03-30 NOTE — Telephone Encounter (Signed)
Received VM message left 2 8:10 am from patient stating she feels she needs paracentesis today. Her abdominal fluid  (ascites) has increased overnight and she is very uncomfortable. Order placed for procedure and central scheduling contacted to schedule procedure. Procedure scheduled for 10:30 am, with arrival time @ 10:15 am at [facility].  Call made to patient and made her aware of time.  She voiced understanding and will arrive as scheduled.

## 2015-03-31 ENCOUNTER — Ambulatory Visit (HOSPITAL_BASED_OUTPATIENT_CLINIC_OR_DEPARTMENT_OTHER): Payer: 59

## 2015-03-31 ENCOUNTER — Ambulatory Visit: Payer: 59 | Admitting: Nutrition

## 2015-03-31 ENCOUNTER — Ambulatory Visit (HOSPITAL_BASED_OUTPATIENT_CLINIC_OR_DEPARTMENT_OTHER): Payer: 59 | Admitting: Oncology

## 2015-03-31 ENCOUNTER — Encounter: Payer: 59 | Admitting: Nutrition

## 2015-03-31 ENCOUNTER — Ambulatory Visit: Payer: 59 | Admitting: Nurse Practitioner

## 2015-03-31 ENCOUNTER — Telehealth: Payer: Self-pay | Admitting: Oncology

## 2015-03-31 ENCOUNTER — Other Ambulatory Visit: Payer: 59

## 2015-03-31 VITALS — BP 113/73 | HR 110 | Temp 98.0°F | Resp 18 | Ht 64.0 in | Wt 155.8 lb

## 2015-03-31 DIAGNOSIS — C786 Secondary malignant neoplasm of retroperitoneum and peritoneum: Secondary | ICD-10-CM

## 2015-03-31 DIAGNOSIS — C250 Malignant neoplasm of head of pancreas: Secondary | ICD-10-CM

## 2015-03-31 DIAGNOSIS — R110 Nausea: Secondary | ICD-10-CM

## 2015-03-31 DIAGNOSIS — I10 Essential (primary) hypertension: Secondary | ICD-10-CM

## 2015-03-31 DIAGNOSIS — K769 Liver disease, unspecified: Secondary | ICD-10-CM | POA: Diagnosis not present

## 2015-03-31 DIAGNOSIS — Z5111 Encounter for antineoplastic chemotherapy: Secondary | ICD-10-CM

## 2015-03-31 DIAGNOSIS — R944 Abnormal results of kidney function studies: Secondary | ICD-10-CM

## 2015-03-31 DIAGNOSIS — D500 Iron deficiency anemia secondary to blood loss (chronic): Secondary | ICD-10-CM

## 2015-03-31 DIAGNOSIS — D6481 Anemia due to antineoplastic chemotherapy: Secondary | ICD-10-CM | POA: Diagnosis not present

## 2015-03-31 DIAGNOSIS — E876 Hypokalemia: Secondary | ICD-10-CM

## 2015-03-31 DIAGNOSIS — D638 Anemia in other chronic diseases classified elsewhere: Secondary | ICD-10-CM

## 2015-03-31 DIAGNOSIS — G893 Neoplasm related pain (acute) (chronic): Secondary | ICD-10-CM

## 2015-03-31 LAB — CBC WITH DIFFERENTIAL/PLATELET
BASO%: 0.2 % (ref 0.0–2.0)
Basophils Absolute: 0 10*3/uL (ref 0.0–0.1)
EOS ABS: 0 10*3/uL (ref 0.0–0.5)
EOS%: 0.2 % (ref 0.0–7.0)
HEMATOCRIT: 26.9 % — AB (ref 34.8–46.6)
HGB: 8.6 g/dL — ABNORMAL LOW (ref 11.6–15.9)
LYMPH#: 0.5 10*3/uL — AB (ref 0.9–3.3)
LYMPH%: 3.8 % — ABNORMAL LOW (ref 14.0–49.7)
MCH: 25.6 pg (ref 25.1–34.0)
MCHC: 32 g/dL (ref 31.5–36.0)
MCV: 80.1 fL (ref 79.5–101.0)
MONO#: 1.6 10*3/uL — AB (ref 0.1–0.9)
MONO%: 12.3 % (ref 0.0–14.0)
NEUT#: 10.7 10*3/uL — ABNORMAL HIGH (ref 1.5–6.5)
NEUT%: 83.5 % — ABNORMAL HIGH (ref 38.4–76.8)
Platelets: 269 10*3/uL (ref 145–400)
RBC: 3.36 10*6/uL — ABNORMAL LOW (ref 3.70–5.45)
RDW: 19.4 % — ABNORMAL HIGH (ref 11.2–14.5)
WBC: 12.8 10*3/uL — AB (ref 3.9–10.3)

## 2015-03-31 LAB — COMPREHENSIVE METABOLIC PANEL (CC13)
ALBUMIN: 1.5 g/dL — AB (ref 3.5–5.0)
ALT: 12 U/L (ref 0–55)
AST: 23 U/L (ref 5–34)
Alkaline Phosphatase: 531 U/L — ABNORMAL HIGH (ref 40–150)
Anion Gap: 13 mEq/L — ABNORMAL HIGH (ref 3–11)
BUN: 22 mg/dL (ref 7.0–26.0)
CALCIUM: 7.3 mg/dL — AB (ref 8.4–10.4)
CO2: 17 meq/L — AB (ref 22–29)
Chloride: 107 mEq/L (ref 98–109)
Creatinine: 2 mg/dL — ABNORMAL HIGH (ref 0.6–1.1)
EGFR: 34 mL/min/{1.73_m2} — ABNORMAL LOW (ref >90)
GLUCOSE: 106 mg/dL (ref 70–140)
Potassium: 2.9 mEq/L — CL (ref 3.5–5.1)
Sodium: 137 mEq/L (ref 136–145)
Total Bilirubin: <0.2 mg/dL (ref 0.20–1.20)
Total Protein: 4.9 g/dL — ABNORMAL LOW (ref 6.4–8.3)

## 2015-03-31 MED ORDER — IRINOTECAN HCL CHEMO INJECTION 100 MG/5ML
140.0000 mg/m2 | Freq: Once | INTRAVENOUS | Status: AC
  Administered 2015-03-31: 268 mg via INTRAVENOUS
  Filled 2015-03-31: qty 13.4

## 2015-03-31 MED ORDER — OXALIPLATIN CHEMO INJECTION 100 MG/20ML: 65.0000 mg/m2 | Freq: Once | INTRAVENOUS | Status: DC

## 2015-03-31 MED ORDER — DEXTROSE 5 % IV SOLN
400.0000 mg/m2 | Freq: Once | INTRAVENOUS | Status: AC
  Administered 2015-03-31: 768 mg via INTRAVENOUS
  Filled 2015-03-31: qty 38.4

## 2015-03-31 MED ORDER — ATROPINE SULFATE 1 MG/ML IJ SOLN
INTRAMUSCULAR | Status: AC
  Filled 2015-03-31: qty 1

## 2015-03-31 MED ORDER — PALONOSETRON HCL INJECTION 0.25 MG/5ML
0.2500 mg | Freq: Once | INTRAVENOUS | Status: AC
  Administered 2015-03-31: 0.25 mg via INTRAVENOUS

## 2015-03-31 MED ORDER — DEXTROSE 5 % IV SOLN
Freq: Once | INTRAVENOUS | Status: AC
Start: 2015-03-31 — End: 2015-03-31
  Administered 2015-03-31: 10:00:00 via INTRAVENOUS

## 2015-03-31 MED ORDER — ATROPINE SULFATE 1 MG/ML IJ SOLN
0.5000 mg | Freq: Once | INTRAMUSCULAR | Status: AC | PRN
  Administered 2015-03-31: 0.5 mg via INTRAVENOUS

## 2015-03-31 MED ORDER — OXALIPLATIN CHEMO INJECTION 100 MG/20ML
65.0000 mg/m2 | Freq: Once | INTRAVENOUS | Status: AC
  Administered 2015-03-31: 125 mg via INTRAVENOUS
  Filled 2015-03-31: qty 25

## 2015-03-31 MED ORDER — SODIUM CHLORIDE 0.9 % IV SOLN
Freq: Once | INTRAVENOUS | Status: AC
  Administered 2015-03-31: 10:00:00 via INTRAVENOUS
  Filled 2015-03-31: qty 2

## 2015-03-31 MED ORDER — SODIUM CHLORIDE 0.9 % IV SOLN
2400.0000 mg/m2 | INTRAVENOUS | Status: DC
  Filled 2015-03-31: qty 92

## 2015-03-31 MED ORDER — PALONOSETRON HCL INJECTION 0.25 MG/5ML
INTRAVENOUS | Status: AC
  Filled 2015-03-31: qty 5

## 2015-03-31 MED ORDER — SODIUM CHLORIDE 0.9 % IV SOLN
2400.0000 mg/m2 | INTRAVENOUS | Status: AC
  Administered 2015-03-31: 4600 mg via INTRAVENOUS
  Filled 2015-03-31: qty 92

## 2015-03-31 MED ORDER — POTASSIUM CHLORIDE CRYS ER 20 MEQ PO TBCR: 20.0000 meq | EXTENDED_RELEASE_TABLET | Freq: Three times a day (TID) | ORAL | Status: AC

## 2015-03-31 NOTE — Progress Notes (Signed)
Ok to treat with creatinine of 2.0 per Dr Benay Spice. Pt states she is taking potassium as prescribed (2.9 today), per Dr Benay Spice pt was instructed to take K 3 times daily.  Pt verbalized understanding.

## 2015-03-31 NOTE — Patient Instructions (Signed)
[name] Discharge Instructions for Patients Receiving Chemotherapy  Today you received the following chemotherapy agents fluorouracil/leucovorin/irinotecan/oxaliplatin  To help prevent nausea and vomiting after your treatment, we encourage you to take your nausea medication as directed   If you develop nausea and vomiting that is not controlled by your nausea medication, call the clinic.   BELOW ARE SYMPTOMS THAT SHOULD BE REPORTED IMMEDIATELY:  *FEVER GREATER THAN 100.5 F  *CHILLS WITH OR WITHOUT FEVER  NAUSEA AND VOMITING THAT IS NOT CONTROLLED WITH YOUR NAUSEA MEDICATION  *UNUSUAL SHORTNESS OF BREATH  *UNUSUAL BRUISING OR BLEEDING  TENDERNESS IN MOUTH AND THROAT WITH OR WITHOUT PRESENCE OF ULCERS  *URINARY PROBLEMS  *BOWEL PROBLEMS  UNUSUAL RASH Items with * indicate a potential emergency and should be followed up as soon as possible.  Feel free to call the clinic you have any questions or concerns. The clinic phone number is [phone].

## 2015-03-31 NOTE — Progress Notes (Signed)
Frances OFFICE PROGRESS NOTE   Diagnosis: Pancreas cancer  INTERVAL HISTORY:   Frances [name] completed another cycle of FOLFIRINOX beginning 03/17/2015. She denies nausea/vomiting. She has noted increased cold sensitivity. The abdominal pain has improved. She develops back pain when she is distended. She underwent a paracentesis yesterday.  Objective:  Vital signs in last 24 hours:  Blood pressure 113/73, pulse 110, temperature 98 F (36.7 C), temperature source Oral, resp. rate 18, height 5\' 4"  (1.626 m), weight 155 lb 12.8 oz (70.67 kg), SpO2 100 %.    HEENT: No thrush or ulcers Resp: Lungs clear bilaterally Cardio: Regular rate and rhythm, tachycardia GI: The abdomen is distended, right upper quadrant abdominal wall mass appears smaller, firm fullness at the low abdomen/pelvis Vascular: 1+ edema at the low leg bilaterally Neuro: The vibratory sense is intact at the fingertips bilaterally    Portacath/PICC-without erythema  Lab Results:  Lab Results  Component Value Date   WBC 12.8* 03/31/2015   HGB 8.6* 03/31/2015   HCT 26.9* 03/31/2015   MCV 80.1 03/31/2015   PLT 269 03/31/2015   NEUTROABS 10.7* 03/31/2015   BUN 22, creatinine 2.0   Lab Results  Component Value Date   CEA 288.5* 03/03/2015    Imaging:  US Paracentesis  03/30/2015   INDICATION: Pancreatic cancer, ascites. Request is made for therapeutic paracentesis.  EXAM: ULTRASOUND-GUIDED THERAPEUTIC PARACENTESIS  COMPARISON:  Prior paracentesis on 03/23/2015  MEDICATIONS: None.  COMPLICATIONS: None immediate  TECHNIQUE: Informed written consent was obtained from the patient after a discussion of the risks, benefits and alternatives to treatment. A timeout was performed prior to the initiation of the procedure.  Initial ultrasound scanning demonstrates a moderate amount of ascites within the left lower abdominal quadrant. The left lower abdomen was prepped and draped in the usual sterile  fashion. 1% lidocaine was used for local anesthesia. Under direct ultrasound guidance, a 19 gauge, 10-cm, Yueh catheter was introduced. An ultrasound image was saved for documentation purposed. The paracentesis was performed. The catheter was removed and a dressing was applied. The patient tolerated the procedure well without immediate post procedural complication.  FINDINGS: A total of approximately 3.6 liters of yellow fluid was removed.  IMPRESSION: Successful ultrasound-guided therapeutic paracentesis yielding 3.6 liters of peritoneal fluid.  Read by: Rowe Robert, PA-C   Electronically Signed   By: Marybelle Killings M.D.   On: 03/30/2015 13:06    Medications: I have reviewed the patient's current medications.  Assessment/Plan: 1. 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 Initiation of FOLFIRINOX 10/29/2013. She did not complete the 5-FU pump due to developing altered speech. Cycle 2 completed 11/12/2013. Cycle 3 completed 11/26/2013. Cycle 4 completed 12/09/2013, cycle 5 12/24/2013, cycle 6 01/14/2014, cycle 7 01/28/2014, cycle 8 02/11/2014, cycle 9 02/25/2014, cycle 10 03/11/2014  Restaging CT 01/06/2012 with a slight increase in the size of the pancreas mass and no clear evidence of distant metastatic disease.   CEA improved 01/28/2014.   Cycle 1 of modified FOLFIRI 03/25/2014.   Cycle 2 modified FOLFIRI 04/08/2014.   CEA stable 04/26/2014.   Restaging CT abdomen/pelvis 04/26/2014 with interval decrease in the size of the pancreatic head mass. Stable mesenteric and retroperitoneal lymphadenopathy. Stable splenomegaly. 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 Stable small liver lesions consistent with benign cysts.   Continuation of FOLFIRI on a 3 week schedule.   CEA increased (13.8) on 06/03/2014.   CEA increased (23.3) on 07/08/2014.  Restaging CT evaluation 08/03/2014 with mild interval enlargement of the  pancreatic head mass. Slight increase in the size of small central mesenteric lymph nodes. 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 Common bile duct stent in place without evidence of biliary duct dilatation. Stable small hypodensities within the liver.   Initiation of concurrent capecitabine and radiation 08/19/2014, completed 09/24/2014  CEA mildly improved on 10/18/2014 (19.9).  Restaging CT 11/25/2014 with an enlarged pancreas body mass, new liver lesions, retroperitoneal soft tissue mass obstructing the left ureter, new ascites, loculated peritoneal fluid superior to the bladder  Initiation of gemcitabine/Abraxane day one/day 8 of a 21 day cycle 12/08/2014.  Day 8 treatment held on 12/16/2014 due to neutropenia and thrombocytopenia.  Cycle 2 gemcitabine/Abraxane day 1 12/24/2014.  Cycle 2 day 8 held on 12/31/2014 due to neutropenia  Gemcitabine/Abraxane schedule adjusted to every 2 weeks beginning 01/07/2015. Last given 02/04/2015  CT 02/11/2015 confirmed progression of a cystic/solid anterior peritoneal mass, progressive liver lesions, and suspicion for a metastasis at L1  Paracentesis 02/16/2015 with cytology confirming metastatic adenocarcinoma  Salvage therapy with FOLFIRINOX initiated 03/03/2015  2. Altered speech prior to completion of cycle 1 of FOLFIRINOX, similar symptoms and tongue/lip twitching following the oxaliplatin with cycle 2 FOLFIRINOX. Oxaliplatin was dose reduced and given over 4 hours beginning with cycle 3. She had similar neurologic symptoms after subsequent cycles of FOLFIRINOX. 3. Status post cholecystectomy 10/02/2013 with pathology revealing chronic cholecystitis. 4. Abdomen/back pain secondary to #1. Improved following Xeloda/radiation 5. Hypertension. 6. Status post Port-A-Cath placement 10/28/2013. 7. History of elevated liver enzyme-improved. 8. History of hypokalemia.  9. History of neutropenia  secondary to chemotherapy. Neulasta was added beginning with cycle 6 of FOLFIRINOX on 01/14/2014. 10. Delayed nausea. Improved with the addition of Aloxi. 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 12. Low back pain radiating to the left lateral leg status post negative plain x-ray evaluation 11/01/2014. 13. Left hydronephrosis and hydroureter secondary to a retroperitoneal soft tissue mass on the CT 11/25/2014.   Status post left double-J stent placement 12/13/2014 by Dr. Risa Grill. 14. Elevated creatinine 15. Loose stools after meals. Question pancreatic insufficiency. Pancreatic enzyme replacement initiated 12/31/2014. Improved. 16. Anemia secondary to chronic disease, chemotherapy, and rectal bleeding-status post a red cell transfusion 02/11/2015 17. Hypokalemia   Disposition:  She has completed 2 cycles of salvage FOLFIRINOX therapy. Her overall performance status appears slightly improved. The plan is to proceed with cycle 3 today. We will monitor the elevated creatinine and replete the potassium. The elevated creatinine may be related to the paracentesis performed yesterday. She will return for an office visit and chemotherapy in 2 weeks. We will check a CEA when she returns in 2 weeks.  Frances [name] will undergo therapeutic paracentesis procedures as needed.  Betsy Coder, MD  03/31/2015  9:15 AM

## 2015-03-31 NOTE — Progress Notes (Signed)
Patient was last seen 01/14/2014.  She is a 49 year old female diagnosed with pancreas cancer and a patient of Dr. Julieanne Manson. Patient has had several paracentesis secondary to ascites.   Last paracentesis produced 3.6 L of fluid and was completed Audre 20. Current weight documented as 155.8 pounds Welda 21.  This is decreased from 206.5 pounds 01/07/2014.   Patient has had severe weight loss.  Nutrition diagnosis: Unintended weight loss continues.  Patient meets criteria for severe malnutrition in the context of chronic illness secondary to 18% weight loss in 6 months and less than 75% energy intake for greater than one month.  Intervention: Patient educated to continue small frequent meals and snacks with high-calorie, high-protein foods as tolerated. Patient to consume oral nutrition supplements as tolerated. Provided coupons for ensure. Questions were answered and teach back method used.  Monitoring, evaluation, goals: Patient will increase calories and protein to minimize loss of lean body mass.  Next visit: Thursday, May 5, in infusion.  **Disclaimer: This note was dictated with voice recognition software. Similar sounding words can inadvertently be transcribed and this note may contain transcription errors which may not have been corrected upon publication of note.**

## 2015-03-31 NOTE — Telephone Encounter (Signed)
Pt confirmed labs/ov per 04/21 POF, gave pt AVS and Calendar.... KJ, sent msg to add chemo °

## 2015-04-02 ENCOUNTER — Ambulatory Visit (HOSPITAL_BASED_OUTPATIENT_CLINIC_OR_DEPARTMENT_OTHER): Payer: 59

## 2015-04-02 ENCOUNTER — Ambulatory Visit: Payer: 59

## 2015-04-02 VITALS — BP 110/78 | HR 107 | Temp 97.4°F | Resp 18

## 2015-04-02 DIAGNOSIS — C250 Malignant neoplasm of head of pancreas: Secondary | ICD-10-CM | POA: Diagnosis not present

## 2015-04-02 DIAGNOSIS — Z5189 Encounter for other specified aftercare: Secondary | ICD-10-CM | POA: Diagnosis not present

## 2015-04-02 MED ORDER — SODIUM CHLORIDE 0.9 % IJ SOLN
10.0000 mL | INTRAMUSCULAR | Status: DC | PRN
  Administered 2015-04-02: 10 mL
  Filled 2015-04-02: qty 10

## 2015-04-02 MED ORDER — HEPARIN SOD (PORK) LOCK FLUSH 100 UNIT/ML IV SOLN
500.0000 [IU] | Freq: Once | INTRAVENOUS | Status: AC | PRN
  Administered 2015-04-02: 500 [IU]
  Filled 2015-04-02: qty 5

## 2015-04-02 MED ORDER — PEGFILGRASTIM INJECTION 6 MG/0.6ML ~~LOC~~
6.0000 mg | PREFILLED_SYRINGE | Freq: Once | SUBCUTANEOUS | Status: AC
  Administered 2015-04-02: 6 mg via SUBCUTANEOUS

## 2015-04-02 NOTE — Progress Notes (Signed)
Charted on infusion appt

## 2015-04-05 ENCOUNTER — Other Ambulatory Visit: Payer: Self-pay | Admitting: *Deleted

## 2015-04-05 DIAGNOSIS — C250 Malignant neoplasm of head of pancreas: Secondary | ICD-10-CM

## 2015-04-06 ENCOUNTER — Telehealth: Payer: Self-pay | Admitting: *Deleted

## 2015-04-06 ENCOUNTER — Ambulatory Visit (HOSPITAL_COMMUNITY): Admission: RE | Admit: 2015-04-06 | Payer: 59 | Source: Ambulatory Visit

## 2015-04-06 NOTE — Telephone Encounter (Signed)
Patient called stating she arrived at her appointment for thoracentesis at 1500 04/06/15 and radiology informed her that the appointment time was for 1330 04/06/15.  Radiology was not able to work in patient today but has scheduled her for Friday 04/08/15.  Patient asked if we could get her a sooner appointment.  [facility] radiology.  Earliest appointment is Friday 04/08/15. Informed patient.  Patient then stated she needs a letter from Dr. Benay Spice or Elby Showers. Marcello Moores stating she may work from home. Informed Elby Showers. Marcello Moores, NP.

## 2015-04-06 NOTE — Telephone Encounter (Signed)
Frances [name] called from [facility] reporting she was to have thoracentesis at 1:30 today.  Has been rescheduled for Friday at [place] and would like to speak with [facility] and be seen tomorrow.  [facility] option available.  Assessed with this call.  "I have bloating, fullness, trouble walking and the same problems i have every week!"  Listened to Frances [name] verbalize her disappointment with today's scheduling confusion.  No new symptoms and this nurse notified her these symptoms would be addressed Friday morning with the paracentesis, otherwise to go to the ER if any distress.  Apologized for any miscommunication.  This nurse read today's appointment scheduled at 3:00 pm.  Apologized that Central Scheduling did not call patient as she denies ANYONE calling her and being given misinformation when she called [facility].

## 2015-04-06 NOTE — Telephone Encounter (Signed)
"  I spoke with Beth yesterday and haven't heard about the status of a paracentesis."  Appointment is today at 3:00 pm at [facility].  Advised to arrive at [facility] to register at 2:30 or no later than 2:45 pm.

## 2015-04-08 ENCOUNTER — Ambulatory Visit (HOSPITAL_COMMUNITY)
Admission: RE | Admit: 2015-04-08 | Discharge: 2015-04-08 | Disposition: A | Discharge status: Home / Self Care | Payer: 59 | Source: Ambulatory Visit | Attending: Oncology | Admitting: Oncology

## 2015-04-08 DIAGNOSIS — C259 Malignant neoplasm of pancreas, unspecified: Secondary | ICD-10-CM | POA: Diagnosis not present

## 2015-04-08 DIAGNOSIS — R188 Other ascites: Secondary | ICD-10-CM | POA: Insufficient documentation

## 2015-04-08 DIAGNOSIS — C250 Malignant neoplasm of head of pancreas: Secondary | ICD-10-CM

## 2015-04-08 NOTE — Procedures (Signed)
Successful US guided paracentesis from [facility].  Yielded 4 liters of serous fluid.  No immediate complications.  Pt tolerated well.   Specimen was not sent for labs.  Tsosie Billing D PA-C 04/08/2015 1:18 PM

## 2015-04-10 ENCOUNTER — Other Ambulatory Visit: Payer: Self-pay | Admitting: Oncology

## 2015-04-14 ENCOUNTER — Ambulatory Visit (HOSPITAL_BASED_OUTPATIENT_CLINIC_OR_DEPARTMENT_OTHER): Payer: 59 | Admitting: Nurse Practitioner

## 2015-04-14 ENCOUNTER — Ambulatory Visit (HOSPITAL_BASED_OUTPATIENT_CLINIC_OR_DEPARTMENT_OTHER): Payer: 59

## 2015-04-14 ENCOUNTER — Other Ambulatory Visit (HOSPITAL_BASED_OUTPATIENT_CLINIC_OR_DEPARTMENT_OTHER): Payer: 59

## 2015-04-14 ENCOUNTER — Encounter: Payer: Self-pay | Admitting: Nurse Practitioner

## 2015-04-14 ENCOUNTER — Telehealth: Payer: Self-pay | Admitting: Oncology

## 2015-04-14 ENCOUNTER — Ambulatory Visit: Payer: 59 | Admitting: Nutrition

## 2015-04-14 VITALS — BP 107/76 | HR 111 | Temp 97.5°F | Resp 18 | Ht 64.0 in | Wt 161.8 lb

## 2015-04-14 DIAGNOSIS — C250 Malignant neoplasm of head of pancreas: Secondary | ICD-10-CM

## 2015-04-14 DIAGNOSIS — K769 Liver disease, unspecified: Secondary | ICD-10-CM

## 2015-04-14 DIAGNOSIS — G893 Neoplasm related pain (acute) (chronic): Secondary | ICD-10-CM

## 2015-04-14 DIAGNOSIS — D6481 Anemia due to antineoplastic chemotherapy: Secondary | ICD-10-CM

## 2015-04-14 DIAGNOSIS — C786 Secondary malignant neoplasm of retroperitoneum and peritoneum: Secondary | ICD-10-CM

## 2015-04-14 DIAGNOSIS — I10 Essential (primary) hypertension: Secondary | ICD-10-CM

## 2015-04-14 DIAGNOSIS — Z5111 Encounter for antineoplastic chemotherapy: Secondary | ICD-10-CM

## 2015-04-14 DIAGNOSIS — R110 Nausea: Secondary | ICD-10-CM

## 2015-04-14 DIAGNOSIS — E876 Hypokalemia: Secondary | ICD-10-CM

## 2015-04-14 DIAGNOSIS — D638 Anemia in other chronic diseases classified elsewhere: Secondary | ICD-10-CM

## 2015-04-14 DIAGNOSIS — D500 Iron deficiency anemia secondary to blood loss (chronic): Secondary | ICD-10-CM

## 2015-04-14 LAB — CBC WITH DIFFERENTIAL/PLATELET
BASO%: 0.1 % (ref 0.0–2.0)
Basophils Absolute: 0 10*3/uL (ref 0.0–0.1)
EOS ABS: 0 10*3/uL (ref 0.0–0.5)
EOS%: 0.1 % (ref 0.0–7.0)
HEMATOCRIT: 26.5 % — AB (ref 34.8–46.6)
HGB: 8.5 g/dL — ABNORMAL LOW (ref 11.6–15.9)
LYMPH%: 5.8 % — ABNORMAL LOW (ref 14.0–49.7)
MCH: 25.7 pg (ref 25.1–34.0)
MCHC: 32.1 g/dL (ref 31.5–36.0)
MCV: 80.1 fL (ref 79.5–101.0)
MONO#: 1.4 10*3/uL — ABNORMAL HIGH (ref 0.1–0.9)
MONO%: 8.7 % (ref 0.0–14.0)
NEUT%: 85.3 % — AB (ref 38.4–76.8)
NEUTROS ABS: 13.9 10*3/uL — AB (ref 1.5–6.5)
PLATELETS: 209 10*3/uL (ref 145–400)
RBC: 3.31 10*6/uL — ABNORMAL LOW (ref 3.70–5.45)
RDW: 21.2 % — ABNORMAL HIGH (ref 11.2–14.5)
WBC: 16.3 10*3/uL — ABNORMAL HIGH (ref 3.9–10.3)
lymph#: 1 10*3/uL (ref 0.9–3.3)
nRBC: 0 % (ref 0–0)

## 2015-04-14 LAB — COMPREHENSIVE METABOLIC PANEL (CC13)
ALT: 16 U/L (ref 0–55)
AST: 26 U/L (ref 5–34)
Albumin: 1.5 g/dL — ABNORMAL LOW (ref 3.5–5.0)
Alkaline Phosphatase: 374 U/L — ABNORMAL HIGH (ref 40–150)
Anion Gap: 13 mEq/L — ABNORMAL HIGH (ref 3–11)
BILIRUBIN TOTAL: 0.24 mg/dL (ref 0.20–1.20)
BUN: 17 mg/dL (ref 7.0–26.0)
CALCIUM: 6.9 mg/dL — AB (ref 8.4–10.4)
CO2: 17 mEq/L — ABNORMAL LOW (ref 22–29)
CREATININE: 2.1 mg/dL — AB (ref 0.6–1.1)
Chloride: 110 mEq/L — ABNORMAL HIGH (ref 98–109)
EGFR: 32 mL/min/{1.73_m2} — AB (ref >90)
GLUCOSE: 105 mg/dL (ref 70–140)
POTASSIUM: 2.4 meq/L — AB (ref 3.5–5.1)
SODIUM: 140 meq/L (ref 136–145)
Total Protein: 4.7 g/dL — ABNORMAL LOW (ref 6.4–8.3)

## 2015-04-14 LAB — CEA: CEA: 277.8 ng/mL — ABNORMAL HIGH (ref 0.0–5.0)

## 2015-04-14 MED ORDER — IRINOTECAN HCL CHEMO INJECTION 100 MG/5ML
140.0000 mg/m2 | Freq: Once | INTRAVENOUS | Status: AC
  Administered 2015-04-14: 268 mg via INTRAVENOUS
  Filled 2015-04-14: qty 13.4

## 2015-04-14 MED ORDER — FOSAPREPITANT DIMEGLUMINE INJECTION 150 MG
Freq: Once | INTRAVENOUS | Status: AC
  Administered 2015-04-14: 11:00:00 via INTRAVENOUS
  Filled 2015-04-14: qty 5

## 2015-04-14 MED ORDER — SODIUM CHLORIDE 0.9 % IV SOLN
2400.0000 mg/m2 | INTRAVENOUS | Status: DC
  Administered 2015-04-14: 4600 mg via INTRAVENOUS
  Filled 2015-04-14: qty 92

## 2015-04-14 MED ORDER — ATROPINE SULFATE 1 MG/ML IJ SOLN
0.5000 mg | Freq: Once | INTRAMUSCULAR | Status: AC | PRN
  Administered 2015-04-14: 0.5 mg via INTRAVENOUS

## 2015-04-14 MED ORDER — PALONOSETRON HCL INJECTION 0.25 MG/5ML
INTRAVENOUS | Status: AC
  Filled 2015-04-14: qty 5

## 2015-04-14 MED ORDER — LEUCOVORIN CALCIUM INJECTION 350 MG
400.0000 mg/m2 | Freq: Once | INTRAVENOUS | Status: AC
  Administered 2015-04-14: 768 mg via INTRAVENOUS
  Filled 2015-04-14: qty 38.4

## 2015-04-14 MED ORDER — SODIUM CHLORIDE 0.9 % IV SOLN: Freq: Once | INTRAVENOUS | Status: DC

## 2015-04-14 MED ORDER — PALONOSETRON HCL INJECTION 0.25 MG/5ML
0.2500 mg | Freq: Once | INTRAVENOUS | Status: AC
  Administered 2015-04-14: 0.25 mg via INTRAVENOUS

## 2015-04-14 MED ORDER — OXALIPLATIN CHEMO INJECTION 100 MG/20ML
65.0000 mg/m2 | Freq: Once | INTRAVENOUS | Status: AC
  Administered 2015-04-14: 125 mg via INTRAVENOUS
  Filled 2015-04-14: qty 25

## 2015-04-14 MED ORDER — DEXTROSE 5 % IV SOLN
Freq: Once | INTRAVENOUS | Status: AC
  Administered 2015-04-14: 11:00:00 via INTRAVENOUS

## 2015-04-14 MED ORDER — ATROPINE SULFATE 1 MG/ML IJ SOLN
INTRAMUSCULAR | Status: AC
  Filled 2015-04-14: qty 1

## 2015-04-14 NOTE — Progress Notes (Signed)
Ok to treat with creatinine of 2.1 and K of 2.4 per Dr Benay Spice

## 2015-04-14 NOTE — Telephone Encounter (Signed)
Pt confirming labs/ov per 05/05 POF, gave pt AVS and Calendar in chemo room... Sent msg to add chemo ... KJ

## 2015-04-14 NOTE — Progress Notes (Signed)
Nutrition follow-up completed with patient being treated for pancreas cancer. Last paracentesis was Seletha 29 with 4 liters removed. Current weight documented as 161.8 pounds on May 5 increased from 155.8 pounds Robinette 21. Patient reports she is trying to eat better. She eats small frequent meals and snacks and has incorporated strategies taught her at last nutrition visit. Patient continues to drink ensure and requested coupons.  Nutrition diagnosis: Unintended weight loss has improved.  Intervention: Patient educated to continue small frequent meals and snacks with high-calorie, high-protein foods as tolerated. Patient will try ensure Enlive twice a day Coupons provided with purchasing information. Questions were answered.  Teach back method used.  Monitoring, evaluation, goals: Patient will work to increase calories and protein to minimize loss of lean body mass.  Next visit: Thursday, June 2, during infusion.  **Disclaimer: This note was dictated with voice recognition software. Similar sounding words can inadvertently be transcribed and this note may contain transcription errors which may not have been corrected upon publication of note.**

## 2015-04-14 NOTE — Patient Instructions (Signed)
[facility] Discharge Instructions for Patients Receiving Chemotherapy  Today you received the following chemotherapy agents irinotecan/leucovorin/oxaliplatin/fluorouracil   To help prevent nausea and vomiting after your treatment, we encourage you to take your nausea medication as directed   If you develop nausea and vomiting that is not controlled by your nausea medication, call the clinic.   BELOW ARE SYMPTOMS THAT SHOULD BE REPORTED IMMEDIATELY:  *FEVER GREATER THAN 100.5 F  *CHILLS WITH OR WITHOUT FEVER  NAUSEA AND VOMITING THAT IS NOT CONTROLLED WITH YOUR NAUSEA MEDICATION  *UNUSUAL SHORTNESS OF BREATH  *UNUSUAL BRUISING OR BLEEDING  TENDERNESS IN MOUTH AND THROAT WITH OR WITHOUT PRESENCE OF ULCERS  *URINARY PROBLEMS  *BOWEL PROBLEMS  UNUSUAL RASH Items with * indicate a potential emergency and should be followed up as soon as possible.  Feel free to call the clinic you have any questions or concerns. The clinic phone number is [phone].

## 2015-04-14 NOTE — Progress Notes (Addendum)
[facility] OFFICE PROGRESS NOTE   Diagnosis:  Pancreas cancer  INTERVAL HISTORY:   Ms. Moger returns as scheduled. She completed cycle 3 FOLFIRINOX 03/31/2015. Her stomach "did not feel right" for about 3 days after the treatment. No vomiting. No mouth sores. No significant diarrhea. She reports persistent cold sensitivity. No neuropathy symptoms in the absence of cold exposure. She felt better after the paracentesis last week. She has recently noted fullness at the left flank region. Pain continues to be improved.  Objective:  Vital signs in last 24 hours:  Blood pressure 107/76, pulse 111, temperature 97.5 F (36.4 C), temperature source Oral, resp. rate 18, height 5\' 4"  (1.626 m), weight 161 lb 12.8 oz (73.392 kg), SpO2 100 %.    HEENT: No thrush or ulcers. Resp: Lungs clear bilaterally. Cardio: Regular rate and rhythm. GI: Abdomen is distended consistent with ascites. Firmness at the right lower abdomen. Right upper quadrant abdominal mass appears smaller. Vascular: Lower body pitting edema. Neuro: Vibratory sense intact over the fingertips per tuning fork exam.  Port-A-Cath without erythema.   Lab Results:  Lab Results  Component Value Date   WBC 16.3* 04/14/2015   HGB 8.5* 04/14/2015   HCT 26.5* 04/14/2015   MCV 80.1 04/14/2015   PLT 209 04/14/2015   NEUTROABS 13.9* 04/14/2015    Imaging:  No results found.  Medications: I have reviewed the patient's current medications.  Assessment/Plan: 1. 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 Initiation of FOLFIRINOX 10/29/2013. She did not complete the 5-FU pump due to developing altered speech. Cycle 2 completed 11/12/2013. Cycle 3 completed 11/26/2013. Cycle 4 completed 12/09/2013, cycle 5 12/24/2013, cycle 6 01/14/2014, cycle 7 01/28/2014, cycle 8 02/11/2014, cycle 9 02/25/2014, cycle 10 03/11/2014  Restaging CT 01/06/2012 with a slight increase in the size of the pancreas mass and  no clear evidence of distant metastatic disease.   CEA improved 01/28/2014.   Cycle 1 of modified FOLFIRI 03/25/2014.   Cycle 2 modified FOLFIRI 04/08/2014.   CEA stable 04/26/2014.   Restaging CT abdomen/pelvis 04/26/2014 with interval decrease in the size of the pancreatic head mass. Stable mesenteric and retroperitoneal lymphadenopathy. Stable splenomegaly. 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 Stable small liver lesions consistent with benign cysts.   Continuation of FOLFIRI on a 3 week schedule.   CEA increased (13.8) on 06/03/2014.   CEA increased (23.3) on 07/08/2014.   Restaging CT evaluation 08/03/2014 with mild interval enlargement of the pancreatic head mass. Slight increase in the size of small central mesenteric lymph nodes. 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 Common bile duct stent in place without evidence of biliary duct dilatation. Stable small hypodensities within the liver.   Initiation of concurrent capecitabine and radiation 08/19/2014, completed 09/24/2014  CEA mildly improved on 10/18/2014 (19.9).  Restaging CT 11/25/2014 with an enlarged pancreas body mass, new liver lesions, retroperitoneal soft tissue mass obstructing the left ureter, new ascites, loculated peritoneal fluid superior to the bladder  Initiation of gemcitabine/Abraxane day one/day 8 of a 21 day cycle 12/08/2014.  Day 8 treatment held on 12/16/2014 due to neutropenia and thrombocytopenia.  Cycle 2 gemcitabine/Abraxane day 1 12/24/2014.  Cycle 2 day 8 held on 12/31/2014 due to neutropenia  Gemcitabine/Abraxane schedule adjusted to every 2 weeks beginning 01/07/2015. Last given 02/04/2015  CT 02/11/2015 confirmed progression of a cystic/solid anterior peritoneal mass, progressive liver lesions, and suspicion for a metastasis at L1  Paracentesis 02/16/2015 with cytology  confirming metastatic adenocarcinoma  Salvage therapy with FOLFIRINOX initiated 03/03/2015  2. Altered speech prior to completion of cycle 1 of FOLFIRINOX, similar symptoms and tongue/lip twitching following the oxaliplatin with cycle 2 FOLFIRINOX. Oxaliplatin was dose reduced and given over 4 hours beginning with cycle 3. She had similar neurologic symptoms after subsequent cycles of FOLFIRINOX. 3. Status post cholecystectomy 10/02/2013 with pathology revealing chronic cholecystitis. 4. Abdomen/back pain secondary to #1. Improved following Xeloda/radiation 5. Hypertension. 6. Status post Port-A-Cath placement 10/28/2013. 7. History of elevated liver enzyme-improved. 8. History of hypokalemia.  9. History of neutropenia secondary to chemotherapy. Neulasta was added beginning with cycle 6 of FOLFIRINOX on 01/14/2014. 10. Delayed nausea. Improved with the addition of Aloxi. 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 12. Low back pain radiating to the left lateral leg status post negative plain x-ray evaluation 11/01/2014. 13. Left hydronephrosis and hydroureter secondary to a retroperitoneal soft tissue mass on the CT 11/25/2014.   Status post left double-J stent placement 12/13/2014 by Dr. Risa Grill. 14. Elevated creatinine 15. Loose stools after meals. Question pancreatic insufficiency. Pancreatic enzyme replacement initiated 12/31/2014. Improved. 16. Anemia secondary to chronic disease, chemotherapy, and rectal bleeding-status post a red cell transfusion 02/11/2015 17. Hypokalemia   Disposition: Ms. Bowens has completed 3 cycles of FOLFIRINOX. Plan to proceed with cycle 4 today as scheduled. She had increased nausea following cycle 3. We will add Emend to the premedication regimen.  She has persistent hypokalemia. She will increase the potassium supplement to 4 times a day for the next 2 days and will then resume 3 times a day.  She would like to have a paracentesis scheduled 04/18/2015. We made a  referral to radiology.  She will return for a follow-up visit and cycle 5 in 2 weeks. She will contact the office in the interim with any problems.  Patient seen with Dr. Benay Spice. 25 minutes were spent face-to-face at today's visit with the majority of that time involving counseling/coordination of care.    Ned Card ANP/GNP-BC   04/14/2015  9:46 AM   This was a shared visit with Ned Card. Ms. Rauen was interviewed and examined. Her overall performance status appears partially improved since beginning salvage FOLFIRINOX. She will complete another cycle of FOLFIRINOX today.  Julieanne Manson, M.D.

## 2015-04-15 ENCOUNTER — Other Ambulatory Visit: Payer: Self-pay | Admitting: Medical Oncology

## 2015-04-15 ENCOUNTER — Telehealth: Payer: Self-pay | Admitting: *Deleted

## 2015-04-15 NOTE — Telephone Encounter (Signed)
Called and informed patient that cea is better.  Per Dr.  Benay Spice.  Patient verbalized understanding.

## 2015-04-15 NOTE — Telephone Encounter (Signed)
-----   Message from Ladell Pier, MD sent at 04/14/2015  8:44 PM EDT ----- Please call patient, cea is better

## 2015-04-16 ENCOUNTER — Ambulatory Visit (HOSPITAL_BASED_OUTPATIENT_CLINIC_OR_DEPARTMENT_OTHER): Payer: 59

## 2015-04-16 VITALS — BP 103/79 | HR 63 | Temp 98.1°F | Resp 22

## 2015-04-16 DIAGNOSIS — C250 Malignant neoplasm of head of pancreas: Secondary | ICD-10-CM | POA: Diagnosis not present

## 2015-04-16 DIAGNOSIS — Z5189 Encounter for other specified aftercare: Secondary | ICD-10-CM

## 2015-04-16 DIAGNOSIS — C786 Secondary malignant neoplasm of retroperitoneum and peritoneum: Secondary | ICD-10-CM

## 2015-04-16 MED ORDER — SODIUM CHLORIDE 0.9 % IJ SOLN
10.0000 mL | INTRAMUSCULAR | Status: DC | PRN
  Administered 2015-04-16: 10 mL
  Filled 2015-04-16: qty 10

## 2015-04-16 MED ORDER — HEPARIN SOD (PORK) LOCK FLUSH 100 UNIT/ML IV SOLN
500.0000 [IU] | Freq: Once | INTRAVENOUS | Status: AC | PRN
  Administered 2015-04-16: 500 [IU]
  Filled 2015-04-16: qty 5

## 2015-04-16 MED ORDER — PEGFILGRASTIM INJECTION 6 MG/0.6ML ~~LOC~~
6.0000 mg | PREFILLED_SYRINGE | Freq: Once | SUBCUTANEOUS | Status: AC
  Administered 2015-04-16: 6 mg via SUBCUTANEOUS

## 2015-04-16 NOTE — Patient Instructions (Signed)
[facility] Discharge Instructions for Patients Receiving Chemotherapy  Today you received the following chemotherapy agents 70fu,leocovorin,oxaliplatin To help prevent nausea and vomiting after your treatment, we encourage you to take your nausea medication as prescribed.   If you develop nausea and vomiting that is not controlled by your nausea medication, call the clinic.   BELOW ARE SYMPTOMS THAT SHOULD BE REPORTED IMMEDIATELY:  *FEVER GREATER THAN 100.5 F  *CHILLS WITH OR WITHOUT FEVER  NAUSEA AND VOMITING THAT IS NOT CONTROLLED WITH YOUR NAUSEA MEDICATION  *UNUSUAL SHORTNESS OF BREATH  *UNUSUAL BRUISING OR BLEEDING  TENDERNESS IN MOUTH AND THROAT WITH OR WITHOUT PRESENCE OF ULCERS  *URINARY PROBLEMS  *BOWEL PROBLEMS  UNUSUAL RASH Items with * indicate a potential emergency and should be followed up as soon as possible.  Feel free to call the clinic you have any questions or concerns. The clinic phone number is [phone].  Please show the [facility] at check-in to the Emergency Department and triage nurse.

## 2015-04-17 ENCOUNTER — Other Ambulatory Visit: Payer: Self-pay | Admitting: Oncology

## 2015-04-18 ENCOUNTER — Ambulatory Visit (HOSPITAL_COMMUNITY)
Admission: RE | Admit: 2015-04-18 | Discharge: 2015-04-18 | Disposition: A | Discharge status: Home / Self Care | Payer: 59 | Source: Ambulatory Visit | Attending: Nurse Practitioner | Admitting: Nurse Practitioner

## 2015-04-18 DIAGNOSIS — R188 Other ascites: Secondary | ICD-10-CM | POA: Insufficient documentation

## 2015-04-18 DIAGNOSIS — C250 Malignant neoplasm of head of pancreas: Secondary | ICD-10-CM

## 2015-04-18 NOTE — Procedures (Signed)
Ultrasound-guided therapeutic paracentesis performed yielding 4 L slightly hazy, yellow fluid. No immediate complications or blood loss.

## 2015-04-21 ENCOUNTER — Telehealth: Payer: Self-pay | Admitting: Oncology

## 2015-04-21 ENCOUNTER — Telehealth: Payer: Self-pay | Admitting: *Deleted

## 2015-04-21 ENCOUNTER — Ambulatory Visit (HOSPITAL_COMMUNITY)
Admission: RE | Admit: 2015-04-21 | Discharge: 2015-04-21 | Disposition: A | Discharge status: Home / Self Care | Payer: 59 | Source: Ambulatory Visit | Attending: Nurse Practitioner | Admitting: Nurse Practitioner

## 2015-04-21 DIAGNOSIS — R188 Other ascites: Secondary | ICD-10-CM | POA: Diagnosis not present

## 2015-04-21 DIAGNOSIS — C250 Malignant neoplasm of head of pancreas: Secondary | ICD-10-CM

## 2015-04-21 NOTE — Telephone Encounter (Signed)
Spoke with Lauralyn Primes in radiology reporting they are unable to work pt in for 5/13. Pt remains scheduled for paracentesis 5/16. Pt states she can not wait until Monday for paracentesis. Called [facility] ultrasound to see if she can be worked in today. Instructed pt to come in now for procedure.

## 2015-04-21 NOTE — Procedures (Signed)
US guided therapeutic paracentesis performed yielding 4 liters slightly hazy, yellow fluid. No immediate complications or blood loss.

## 2015-04-21 NOTE — Telephone Encounter (Signed)
Left message to confirm appointment 05/16. (no available 05/13)

## 2015-04-21 NOTE — Telephone Encounter (Signed)
Frances [name]. IS ALSO HAVING BLOATING IN HER ABDOMEN AND INCREASED FLUID IN HER HIPS AND THIGHS. Frances [name] WENT TO HER SON'S BALL GAME ON Tuesday. THERE WAS AN INCREASED AMOUNT OF WALKING AND SITTING WITH HER LEGS DOWN AT THE GAME. Frances [name]. RESTED YESTERDAY WITH HER LEGS ELEVATED BUT IS STILL HAVING AN ISSUES. DOES Frances [name]. NEED TO SEE CYNDEE BACON,NP?

## 2015-04-21 NOTE — Telephone Encounter (Signed)
Returned call to pt, she reports the dyspnea feels like when she needs paracentesis. Concerned that she recently had one. Requests to be scheduled for paracentesis on 5/13. Reviewed with Ned Card, NP: OK to schedule paracentesis. Orders entered.

## 2015-04-21 NOTE — Telephone Encounter (Signed)
VM message from patient requesting to move her paracentesis to tomorrow (04/22/15) instead of Monday as she has filled up with fluid quicker than anticipated. Call made to scheduling and appt was made for pt to go to [facility] Radiology @ 1:30 pm tomorrow for procedure @ 2pm.  Call made to patient and informed her of change. She expressed great gratitude as she is feeling very uncomfortable and short of breath today.

## 2015-04-22 ENCOUNTER — Ambulatory Visit (HOSPITAL_COMMUNITY): Payer: 59

## 2015-04-24 ENCOUNTER — Other Ambulatory Visit: Payer: Self-pay | Admitting: Oncology

## 2015-04-25 ENCOUNTER — Ambulatory Visit (HOSPITAL_COMMUNITY): Admission: RE | Admit: 2015-04-25 | Payer: 59 | Source: Ambulatory Visit

## 2015-04-27 ENCOUNTER — Other Ambulatory Visit: Payer: Self-pay | Admitting: *Deleted

## 2015-04-28 ENCOUNTER — Ambulatory Visit (HOSPITAL_BASED_OUTPATIENT_CLINIC_OR_DEPARTMENT_OTHER): Payer: 59

## 2015-04-28 ENCOUNTER — Other Ambulatory Visit (HOSPITAL_BASED_OUTPATIENT_CLINIC_OR_DEPARTMENT_OTHER): Payer: 59

## 2015-04-28 ENCOUNTER — Ambulatory Visit (HOSPITAL_COMMUNITY)
Admission: RE | Admit: 2015-04-28 | Discharge: 2015-04-28 | Disposition: A | Discharge status: Home / Self Care | Payer: 59 | Source: Ambulatory Visit | Attending: Oncology | Admitting: Oncology

## 2015-04-28 ENCOUNTER — Telehealth: Payer: Self-pay | Admitting: Oncology

## 2015-04-28 ENCOUNTER — Ambulatory Visit (HOSPITAL_BASED_OUTPATIENT_CLINIC_OR_DEPARTMENT_OTHER): Payer: 59 | Admitting: Oncology

## 2015-04-28 VITALS — BP 102/68 | HR 100 | Temp 98.2°F | Resp 18

## 2015-04-28 VITALS — BP 97/53 | HR 114 | Temp 97.8°F | Resp 17 | Ht 64.0 in | Wt 162.6 lb

## 2015-04-28 DIAGNOSIS — C250 Malignant neoplasm of head of pancreas: Secondary | ICD-10-CM

## 2015-04-28 DIAGNOSIS — C786 Secondary malignant neoplasm of retroperitoneum and peritoneum: Secondary | ICD-10-CM

## 2015-04-28 DIAGNOSIS — D6481 Anemia due to antineoplastic chemotherapy: Secondary | ICD-10-CM

## 2015-04-28 DIAGNOSIS — D630 Anemia in neoplastic disease: Secondary | ICD-10-CM

## 2015-04-28 DIAGNOSIS — D649 Anemia, unspecified: Secondary | ICD-10-CM | POA: Diagnosis present

## 2015-04-28 DIAGNOSIS — K769 Liver disease, unspecified: Secondary | ICD-10-CM

## 2015-04-28 DIAGNOSIS — D500 Iron deficiency anemia secondary to blood loss (chronic): Secondary | ICD-10-CM

## 2015-04-28 DIAGNOSIS — I10 Essential (primary) hypertension: Secondary | ICD-10-CM

## 2015-04-28 DIAGNOSIS — R944 Abnormal results of kidney function studies: Secondary | ICD-10-CM

## 2015-04-28 DIAGNOSIS — R197 Diarrhea, unspecified: Secondary | ICD-10-CM

## 2015-04-28 DIAGNOSIS — E876 Hypokalemia: Secondary | ICD-10-CM

## 2015-04-28 LAB — CBC WITH DIFFERENTIAL/PLATELET
BASO%: 0.1 % (ref 0.0–2.0)
BASOS ABS: 0 10*3/uL (ref 0.0–0.1)
EOS ABS: 0 10*3/uL (ref 0.0–0.5)
EOS%: 0.1 % (ref 0.0–7.0)
HCT: 24 % — ABNORMAL LOW (ref 34.8–46.6)
HEMOGLOBIN: 7.4 g/dL — AB (ref 11.6–15.9)
LYMPH%: 6.9 % — ABNORMAL LOW (ref 14.0–49.7)
MCH: 25.3 pg (ref 25.1–34.0)
MCHC: 30.8 g/dL — ABNORMAL LOW (ref 31.5–36.0)
MCV: 82.2 fL (ref 79.5–101.0)
MONO#: 1.7 10*3/uL — ABNORMAL HIGH (ref 0.1–0.9)
MONO%: 10.7 % (ref 0.0–14.0)
NEUT%: 82.2 % — AB (ref 38.4–76.8)
NEUTROS ABS: 12.7 10*3/uL — AB (ref 1.5–6.5)
PLATELETS: 185 10*3/uL (ref 145–400)
RBC: 2.92 10*6/uL — ABNORMAL LOW (ref 3.70–5.45)
RDW: 24.3 % — ABNORMAL HIGH (ref 11.2–14.5)
WBC: 15.4 10*3/uL — ABNORMAL HIGH (ref 3.9–10.3)
lymph#: 1.1 10*3/uL (ref 0.9–3.3)

## 2015-04-28 LAB — COMPREHENSIVE METABOLIC PANEL (CC13)
ALBUMIN: 1.5 g/dL — AB (ref 3.5–5.0)
ALK PHOS: 369 U/L — AB (ref 40–150)
ALT: 11 U/L (ref 0–55)
AST: 18 U/L (ref 5–34)
Anion Gap: 10 mEq/L (ref 3–11)
BUN: 18.8 mg/dL (ref 7.0–26.0)
CO2: 15 mEq/L — ABNORMAL LOW (ref 22–29)
Calcium: 6.8 mg/dL — ABNORMAL LOW (ref 8.4–10.4)
Chloride: 116 mEq/L — ABNORMAL HIGH (ref 98–109)
Creatinine: 2.3 mg/dL — ABNORMAL HIGH (ref 0.6–1.1)
EGFR: 29 mL/min/{1.73_m2} — AB (ref >90)
GLUCOSE: 92 mg/dL (ref 70–140)
POTASSIUM: 2.8 meq/L — AB (ref 3.5–5.1)
SODIUM: 141 meq/L (ref 136–145)
Total Bilirubin: 0.23 mg/dL (ref 0.20–1.20)
Total Protein: 4.7 g/dL — ABNORMAL LOW (ref 6.4–8.3)

## 2015-04-28 LAB — PREPARE RBC (CROSSMATCH)

## 2015-04-28 MED ORDER — HEPARIN SOD (PORK) LOCK FLUSH 100 UNIT/ML IV SOLN
500.0000 [IU] | INTRAVENOUS | Status: AC | PRN
  Administered 2015-04-28: 500 [IU]
  Filled 2015-04-28: qty 5

## 2015-04-28 MED ORDER — SODIUM CHLORIDE 0.9 % IJ SOLN
10.0000 mL | INTRAMUSCULAR | Status: AC | PRN
Start: 2015-04-28 — End: 2015-04-28
  Administered 2015-04-28: 10 mL
  Filled 2015-04-28: qty 10

## 2015-04-28 MED ORDER — SODIUM CHLORIDE 0.9 % IV SOLN
250.0000 mL | Freq: Once | INTRAVENOUS | Status: AC
  Administered 2015-04-28: 250 mL via INTRAVENOUS

## 2015-04-28 MED ORDER — POTASSIUM CHLORIDE CRYS ER 20 MEQ PO TBCR
20.0000 meq | EXTENDED_RELEASE_TABLET | Freq: Once | ORAL | Status: AC
  Administered 2015-04-28: 20 meq via ORAL
  Filled 2015-04-28: qty 1

## 2015-04-28 MED ORDER — LOPERAMIDE HCL 2 MG PO TABS
2.0000 mg | ORAL_TABLET | Freq: Once | ORAL | Status: AC
  Administered 2015-04-28: 4 mg via ORAL
  Filled 2015-04-28: qty 2

## 2015-04-28 MED ORDER — OMEPRAZOLE 40 MG PO CPDR: 40.0000 mg | DELAYED_RELEASE_CAPSULE | Freq: Two times a day (BID) | ORAL | Status: AC

## 2015-04-28 MED ORDER — LOPERAMIDE HCL 2 MG PO CAPS
ORAL_CAPSULE | ORAL | Status: AC
  Filled 2015-04-28: qty 2

## 2015-04-28 NOTE — Telephone Encounter (Signed)
Gave patient avs report and appointments for May and June while in inf room. Per 5/19 pof scheduled fu/tx 5/26 and lab (ct to be scheduled) 5/25. Due to 5/19 tx delayed til 5/26 appointments for 6/2 and 6/4 moved to 6/9 and 6/11. Central radiology scheduling will call patient with ct - patient made aware.

## 2015-04-28 NOTE — Patient Instructions (Signed)
Blood Transfusion Information WHAT IS A BLOOD TRANSFUSION? A transfusion is the replacement of blood or some of its parts. Blood is made up of multiple cells which provide different functions.  Red blood cells carry oxygen and are used for blood loss replacement.  White blood cells fight against infection.  Platelets control bleeding.  Plasma helps clot blood.  Other blood products are available for specialized needs, such as hemophilia or other clotting disorders. BEFORE THE TRANSFUSION  Who gives blood for transfusions?   You may be able to donate blood to be used at a later date on yourself (autologous donation).  Relatives can be asked to donate blood. This is generally not any safer than if you have received blood from a stranger. The same precautions are taken to ensure safety when a relative's blood is donated.  Healthy volunteers who are fully evaluated to make sure their blood is safe. This is blood bank blood. Transfusion therapy is the safest it has ever been in the practice of medicine. Before blood is taken from a donor, a complete history is taken to make sure that person has no history of diseases nor engages in risky social behavior (examples are intravenous drug use or sexual activity with multiple partners). The donor's travel history is screened to minimize risk of transmitting infections, such as malaria. The donated blood is tested for signs of infectious diseases, such as HIV and hepatitis. The blood is then tested to be sure it is compatible with you in order to minimize the chance of a transfusion reaction. If you or a relative donates blood, this is often done in anticipation of surgery and is not appropriate for emergency situations. It takes many days to process the donated blood. RISKS AND COMPLICATIONS Although transfusion therapy is very safe and saves many lives, the main dangers of transfusion include:   Getting an infectious disease.  Developing a  transfusion reaction. This is an allergic reaction to something in the blood you were given. Every precaution is taken to prevent this. The decision to have a blood transfusion has been considered carefully by your caregiver before blood is given. Blood is not given unless the benefits outweigh the risks. AFTER THE TRANSFUSION  Right after receiving a blood transfusion, you will usually feel much better and more energetic. This is especially true if your red blood cells have gotten low (anemic). The transfusion raises the level of the red blood cells which carry oxygen, and this usually causes an energy increase.  The nurse administering the transfusion will monitor you carefully for complications. HOME CARE INSTRUCTIONS  No special instructions are needed after a transfusion. You may find your energy is better. Speak with your caregiver about any limitations on activity for underlying diseases you may have. SEEK MEDICAL CARE IF:   Your condition is not improving after your transfusion.  You develop redness or irritation at the intravenous (IV) site. SEEK IMMEDIATE MEDICAL CARE IF:  Any of the following symptoms occur over the next 12 hours:  Shaking chills.  You have a temperature by mouth above 102 F (38.9 C), not controlled by medicine.  Chest, back, or muscle pain.  People around you feel you are not acting correctly or are confused.  Shortness of breath or difficulty breathing.  Dizziness and fainting.  You get a rash or develop hives.  You have a decrease in urine output.  Your urine turns a dark color or changes to pink, red, or brown. Any of the following   symptoms occur over the next 10 days:  You have a temperature by mouth above 102 F (38.9 C), not controlled by medicine.  Shortness of breath.  Weakness after normal activity.  The white part of the eye turns yellow (jaundice).  You have a decrease in the amount of urine or are urinating less often.  Your  urine turns a dark color or changes to pink, red, or brown. Document Released: 11/23/2000 Document Revised: 02/18/2012 Document Reviewed: 07/12/2008 [facility] Patient Information 2015 [place], [place]. This information is not intended to replace advice given to you by your health care provider. Make sure you discuss any questions you have with your health care provider. Neutropenia Neutropenia is a condition that occurs when the level of a certain type of white blood cell (neutrophil) in your body becomes lower than normal. Neutrophils are made in the bone marrow and fight infections. These cells protect against bacteria and viruses. The fewer neutrophils you have, and the longer your body remains without them, the greater your risk of getting a severe infection becomes. CAUSES  The cause of neutropenia may be hard to determine. However, it is usually due to 3 main problems:   Decreased production of neutrophils. This may be due to:  Certain medicines such as chemotherapy.  Genetic problems.  Cancer.  Radiation treatments.  Vitamin deficiency.  Some pesticides.  Increased destruction of neutrophils. This may be due to:  Overwhelming infections.  Hemolytic anemia. This is when the body destroys its own blood cells.  Chemotherapy.  Neutrophils moving to areas of the body where they cannot fight infections. This may be due to:  Dialysis procedures.  Conditions where the spleen becomes enlarged. Neutrophils are held in the spleen and are not available to the rest of the body.  Overwhelming infections. The neutrophils are held in the area of the infection and are not available to the rest of the body. SYMPTOMS  There are no specific symptoms of neutropenia. The lack of neutrophils can result in an infection, and an infection can cause various problems. DIAGNOSIS  Diagnosis is made by a blood test. A complete blood count is performed. The normal level of neutrophils in human blood  differs with age and race. Infants have lower counts than older children and adults. African Americans have lower counts than Caucasians or Asians. The average adult level is 1500 cells/mm3 of blood. Neutrophil counts are interpreted as follows:  Greater than 1000 cells/mm3 gives normal protection against infection.  500 to 1000 cells/mm3 gives an increased risk for infection.  200 to 500 cells/mm3 is a greater risk for severe infection.  Lower than 200 cells/mm3 is a marked risk of infection. This may require hospitalization and treatment with antibiotic medicines. TREATMENT  Treatment depends on the underlying cause, severity, and presence of infections or symptoms. It also depends on your health. Your caregiver will discuss the treatment plan with you. Mild cases are often easily treated and have a good outcome. Preventative measures may also be started to limit your risk of infections. Treatment can include:  Taking antibiotics.  Stopping medicines that are known to cause neutropenia.  Correcting nutritional deficiencies by eating green vegetables to supply folic acid and taking vitamin B supplements.  Stopping exposure to pesticides if your neutropenia is related to pesticide exposure.  Taking a blood growth factor called sargramostim, pegfilgrastim, or filgrastim if you are undergoing chemotherapy for cancer. This stimulates white blood cell production.  Removal of the spleen if you have Felty's syndrome  and have repeated infections. HOME CARE INSTRUCTIONS   Follow your caregiver's instructions about when you need to have blood work done.  Wash your hands often. Make sure others who come in contact with you also wash their hands.  Wash raw fruits and vegetables before eating them. They can carry bacteria and fungi.  Avoid people with colds or spreadable (contagious) diseases (chickenpox, herpes zoster, influenza).  Avoid large crowds.  Avoid construction areas. The dust can  release fungus into the air.  Be cautious around children in daycare or school environments.  Take care of your respiratory system by coughing and deep breathing.  Bathe daily.  Protect your skin from cuts and burns.  Do not work in the garden or with flowers and plants.  Care for the mouth before and after meals by brushing with a soft toothbrush. If you have mucositis, do not use mouthwash. Mouthwash contains alcohol and can dry out the mouth even more.  Clean the area between the genitals and the anus (perineal area) after urination and bowel movements. Women need to wipe from front to back.  Use a water soluble lubricant during sexual intercourse and practice good hygiene after. Do not have intercourse if you are severely neutropenic. Check with your caregiver for guidelines.  Exercise daily as tolerated.  Avoid people who were vaccinated with a live vaccine in the past 30 days. You should not receive live vaccines (polio, typhoid).  Do not provide direct care for pets. Avoid animal droppings. Do not clean litter boxes and bird cages.  Do not share food utensils.  Do not use tampons, enemas, or rectal suppositories unless directed by your caregiver.  Use an electric razor to remove hair.  Wash your hands after handling magazines, letters, and newspapers. SEEK IMMEDIATE MEDICAL CARE IF:   You have a fever.  You have chills or start to shake.  You feel nauseous or vomit.  You develop mouth sores.  You develop aches and pains.  You have redness and swelling around open wounds.  Your skin is warm to the touch.  You have pus coming from your wounds.  You develop swollen lymph nodes.  You feel weak or fatigued.  You develop red streaks on the skin. MAKE SURE YOU:  Understand these instructions.  Will watch your condition.  Will get help right away if you are not doing well or get worse. Document Released: 05/18/2002 Document Revised: 02/18/2012 Document  Reviewed: 06/15/2011 [facility] Patient Information 2015 [place], [place]. This information is not intended to replace advice given to you by your health care provider. Make sure you discuss any questions you have with your health care provider.

## 2015-04-28 NOTE — Progress Notes (Signed)
Spoke with pt in infusion room, instructed her to HOLD Lasix and HCTZ. Continue potassium three times daily. She voiced understanding. Pt will receive 20 meq PO during transfusion today, will recheck lab next week.

## 2015-04-28 NOTE — Progress Notes (Signed)
[facility] OFFICE PROGRESS NOTE   Diagnosis: Pancreas cancer  INTERVAL HISTORY:   [name] returns as scheduled. She completed cycle 4 FOLFIRINOX 04/14/2015. She received Neulasta support. She reports an improved appetite. She has 3-4 loose stools per day. She is taking potassium 3 times per day. No pain. She has cold sensitivity and tingling in the right foot. She last underwent a therapeutic paracentesis on 04/21/2015. 4 L of fluid were removed. She does not feel in need of a paracentesis today. The mouth is "dry ".  Objective:  Vital signs in last 24 hours:  Blood pressure 97/53, pulse 114, temperature 97.8 F (36.6 C), temperature source Oral, resp. rate 17, height 5\' 4"  (1.626 m), weight 162 lb 9.6 oz (73.755 kg), SpO2 100 %.    HEENT: No thrush or ulcers, the mucous membranes appear moist Resp: Lungs clear bilaterally Cardio: Regular rate and rhythm GI: No hepatomegaly, abdominal wall mass in the right subcostal region measures approximate 2 cm and is unchanged. Firm mass filling the right lower abdomen, the left lower abdomen is softer. Vascular: Trace to 1+ edema at the low leg bilaterally Neuro: Mild decrease in vibratory sense at the right greater than left fingertips  Skin: Hyperpigmentation of the hands, mild erythema at the right sole   Portacath/PICC-without erythema  Lab Results:  Lab Results  Component Value Date   WBC 15.4* 04/28/2015   HGB 7.4* 04/28/2015   HCT 24.0* 04/28/2015   MCV 82.2 04/28/2015   PLT 185 04/28/2015   NEUTROABS 12.7* 04/28/2015   potassium 2.8, creatinine 2.3, BUN 18.8    Lab Results  Component Value Date   CEA 277.8* 04/14/2015    Medications: I have reviewed the patient's current medications.  Assessment/Plan: 1. 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 Initiation of FOLFIRINOX 10/29/2013. She did not complete the 5-FU pump due to developing altered speech. Cycle 2 completed  11/12/2013. Cycle 3 completed 11/26/2013. Cycle 4 completed 12/09/2013, cycle 5 12/24/2013, cycle 6 01/14/2014, cycle 7 01/28/2014, cycle 8 02/11/2014, cycle 9 02/25/2014, cycle 10 03/11/2014  Restaging CT 01/06/2012 with a slight increase in the size of the pancreas mass and no clear evidence of distant metastatic disease.   CEA improved 01/28/2014.   Cycle 1 of modified FOLFIRI 03/25/2014.   Cycle 2 modified FOLFIRI 04/08/2014.   CEA stable 04/26/2014.   Restaging CT abdomen/pelvis 04/26/2014 with interval decrease in the size of the pancreatic head mass. Stable mesenteric and retroperitoneal lymphadenopathy. Stable splenomegaly. 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 Stable small liver lesions consistent with benign cysts.   Continuation of FOLFIRI on a 3 week schedule.   CEA increased (13.8) on 06/03/2014.   CEA increased (23.3) on 07/08/2014.   Restaging CT evaluation 08/03/2014 with mild interval enlargement of the pancreatic head mass. Slight increase in the size of small central mesenteric lymph nodes. 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 Common bile duct stent in place without evidence of biliary duct dilatation. Stable small hypodensities within the liver.   Initiation of concurrent capecitabine and radiation 08/19/2014, completed 09/24/2014  CEA mildly improved on 10/18/2014 (19.9).  Restaging CT 11/25/2014 with an enlarged pancreas body mass, new liver lesions, retroperitoneal soft tissue mass obstructing the left ureter, new ascites, loculated peritoneal fluid superior to the bladder  Initiation of gemcitabine/Abraxane day one/day 8 of a 21 day cycle 12/08/2014.  Day 8 treatment held on 12/16/2014 due to  neutropenia and thrombocytopenia.  Cycle 2 gemcitabine/Abraxane day 1 12/24/2014.  Cycle 2 day 8 held on 12/31/2014 due to  neutropenia  Gemcitabine/Abraxane schedule adjusted to every 2 weeks beginning 01/07/2015. Last given 02/04/2015  CT 02/11/2015 confirmed progression of a cystic/solid anterior peritoneal mass, progressive liver lesions, and suspicion for a metastasis at L1  Paracentesis 02/16/2015 with cytology confirming metastatic adenocarcinoma  Salvage therapy with FOLFIRINOX initiated 03/03/2015  2. Altered speech prior to completion of cycle 1 of FOLFIRINOX, similar symptoms and tongue/lip twitching following the oxaliplatin with cycle 2 FOLFIRINOX. Oxaliplatin was dose reduced and given over 4 hours beginning with cycle 3. She had similar neurologic symptoms after subsequent cycles of FOLFIRINOX. 3. Status post cholecystectomy 10/02/2013 with pathology revealing chronic cholecystitis. 4. Abdomen/back pain secondary to #1. Improved following Xeloda/radiation 5. Hypertension. 6. Status post Port-A-Cath placement 10/28/2013. 7. History of elevated liver enzyme-improved. 8. History of hypokalemia.  9. History of neutropenia secondary to chemotherapy. Neulasta was added beginning with cycle 6 of FOLFIRINOX on 01/14/2014. 10. Delayed nausea. Improved with the addition of Aloxi. 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 12. Low back pain radiating to the left lateral leg status post negative plain x-ray evaluation 11/01/2014. 13. Left hydronephrosis and hydroureter secondary to a retroperitoneal soft tissue mass on the CT 11/25/2014.   Status post left double-J stent placement 12/13/2014 by Dr. Risa Grill. 14. Elevated creatinine 15. Loose stools after meals. Question pancreatic insufficiency. Pancreatic enzyme replacement initiated 12/31/2014. Improved. 16. Anemia secondary to chronic disease, chemotherapy, and rectal bleeding-status post a red cell transfusion 02/11/2015 and 04/28/2015 17. Hypokalemia secondary to diarrhea and diuretics   Disposition:  Her performance status appears improved. She has completed  4 cycles of FOLFIRINOX. However she has severe anemia and a persistently elevated creatinine. We decided to hold chemotherapy today and transfuse packed red blood cells. She will receive additional potassium. She will undergo a restaging CT evaluation and return for an office visit with the plan for another cycle FOLFIRINOX next week.  Betsy Coder, MD  04/28/2015  10:00 AM

## 2015-04-29 ENCOUNTER — Other Ambulatory Visit: Payer: Self-pay | Admitting: *Deleted

## 2015-04-29 LAB — TYPE AND SCREEN
ABO/RH(D): O NEG
ANTIBODY SCREEN: NEGATIVE
UNIT DIVISION: 0
Unit division: 0

## 2015-04-30 ENCOUNTER — Ambulatory Visit: Payer: 59

## 2015-05-02 ENCOUNTER — Ambulatory Visit (HOSPITAL_COMMUNITY)
Admission: RE | Admit: 2015-05-02 | Discharge: 2015-05-02 | Disposition: A | Discharge status: Home / Self Care | Payer: 59 | Source: Ambulatory Visit | Attending: Oncology | Admitting: Oncology

## 2015-05-02 ENCOUNTER — Other Ambulatory Visit: Payer: Self-pay | Admitting: *Deleted

## 2015-05-02 ENCOUNTER — Telehealth: Payer: Self-pay | Admitting: *Deleted

## 2015-05-02 DIAGNOSIS — R188 Other ascites: Secondary | ICD-10-CM | POA: Diagnosis present

## 2015-05-02 DIAGNOSIS — C250 Malignant neoplasm of head of pancreas: Secondary | ICD-10-CM

## 2015-05-02 NOTE — Telephone Encounter (Signed)
Pt called requesting paracentesis for today or tomorrow; reports "I can feel the fluid in my stomach getting worse". Pt scheduled for 2p, to be here at 145p. Dr. Benay Spice made aware and approved of pt's request for paracentesis. Left VM regarding instructions and callback number if any questions.

## 2015-05-02 NOTE — Procedures (Signed)
Ultrasound-guided therapeutic paracentesis performed yielding 5 L slightly hazy, yellow fluid. No immediate complications or blood loss.

## 2015-05-03 ENCOUNTER — Telehealth: Payer: Self-pay | Admitting: Oncology

## 2015-05-03 NOTE — Telephone Encounter (Signed)
Pt confirmed CT with GI on [address] and to drink the contrast that she has already.... KJ

## 2015-05-04 ENCOUNTER — Telehealth: Payer: Self-pay | Admitting: *Deleted

## 2015-05-04 ENCOUNTER — Other Ambulatory Visit (HOSPITAL_BASED_OUTPATIENT_CLINIC_OR_DEPARTMENT_OTHER): Payer: 59

## 2015-05-04 ENCOUNTER — Ambulatory Visit
Admission: RE | Admit: 2015-05-04 | Discharge: 2015-05-04 | Disposition: A | Discharge status: Home / Self Care | Payer: 59 | Source: Ambulatory Visit | Attending: Oncology | Admitting: Oncology

## 2015-05-04 ENCOUNTER — Encounter: Payer: Self-pay | Admitting: *Deleted

## 2015-05-04 ENCOUNTER — Telehealth: Payer: Self-pay

## 2015-05-04 DIAGNOSIS — C250 Malignant neoplasm of head of pancreas: Secondary | ICD-10-CM | POA: Diagnosis not present

## 2015-05-04 DIAGNOSIS — C786 Secondary malignant neoplasm of retroperitoneum and peritoneum: Secondary | ICD-10-CM

## 2015-05-04 LAB — CBC WITH DIFFERENTIAL/PLATELET
BASO%: 0.3 % (ref 0.0–2.0)
BASOS ABS: 0 10*3/uL (ref 0.0–0.1)
EOS%: 0.1 % (ref 0.0–7.0)
Eosinophils Absolute: 0 10*3/uL (ref 0.0–0.5)
HCT: 35.4 % (ref 34.8–46.6)
HGB: 11.4 g/dL — ABNORMAL LOW (ref 11.6–15.9)
LYMPH%: 5.5 % — AB (ref 14.0–49.7)
MCH: 27.1 pg (ref 25.1–34.0)
MCHC: 32.3 g/dL (ref 31.5–36.0)
MCV: 83.8 fL (ref 79.5–101.0)
MONO#: 1 10*3/uL — ABNORMAL HIGH (ref 0.1–0.9)
MONO%: 6.6 % (ref 0.0–14.0)
NEUT#: 13.5 10*3/uL — ABNORMAL HIGH (ref 1.5–6.5)
NEUT%: 87.5 % — ABNORMAL HIGH (ref 38.4–76.8)
Platelets: 264 10*3/uL (ref 145–400)
RBC: 4.23 10*6/uL (ref 3.70–5.45)
RDW: 22.5 % — ABNORMAL HIGH (ref 11.2–14.5)
WBC: 15.4 10*3/uL — ABNORMAL HIGH (ref 3.9–10.3)
lymph#: 0.9 10*3/uL (ref 0.9–3.3)

## 2015-05-04 LAB — COMPREHENSIVE METABOLIC PANEL (CC13)
ALK PHOS: 364 U/L — AB (ref 40–150)
ALT: 18 U/L (ref 0–55)
AST: 31 U/L (ref 5–34)
Albumin: 1.5 g/dL — ABNORMAL LOW (ref 3.5–5.0)
Anion Gap: 9 mEq/L (ref 3–11)
BUN: 20.3 mg/dL (ref 7.0–26.0)
CHLORIDE: 116 meq/L — AB (ref 98–109)
CO2: 14 meq/L — AB (ref 22–29)
Calcium: 6.9 mg/dL — ABNORMAL LOW (ref 8.4–10.4)
Creatinine: 2.3 mg/dL — ABNORMAL HIGH (ref 0.6–1.1)
EGFR: 28 mL/min/{1.73_m2} — AB (ref >90)
GLUCOSE: 87 mg/dL (ref 70–140)
Potassium: 3.9 mEq/L (ref 3.5–5.1)
SODIUM: 139 meq/L (ref 136–145)
Total Bilirubin: 0.29 mg/dL (ref 0.20–1.20)
Total Protein: 4.8 g/dL — ABNORMAL LOW (ref 6.4–8.3)

## 2015-05-04 NOTE — Telephone Encounter (Signed)
Pt called asking if she can drink anything but water before her CT today. Called [facility] then called pt back. She can drink anything, no solid foods for 4 hours before the test.

## 2015-05-04 NOTE — Telephone Encounter (Signed)
Per staff message I have changed appts for today and tomorrow. I have called and spoke with the patient. Patient aware of appts

## 2015-05-05 ENCOUNTER — Ambulatory Visit (HOSPITAL_BASED_OUTPATIENT_CLINIC_OR_DEPARTMENT_OTHER): Payer: 59 | Admitting: Oncology

## 2015-05-05 ENCOUNTER — Telehealth: Payer: Self-pay | Admitting: Oncology

## 2015-05-05 ENCOUNTER — Telehealth: Payer: Self-pay | Admitting: *Deleted

## 2015-05-05 ENCOUNTER — Other Ambulatory Visit: Payer: Self-pay | Admitting: Radiology

## 2015-05-05 ENCOUNTER — Ambulatory Visit: Payer: 59

## 2015-05-05 ENCOUNTER — Other Ambulatory Visit: Payer: 59

## 2015-05-05 VITALS — BP 103/71 | HR 116 | Temp 97.9°F | Resp 18 | Ht 64.0 in | Wt 165.1 lb

## 2015-05-05 DIAGNOSIS — E876 Hypokalemia: Secondary | ICD-10-CM

## 2015-05-05 DIAGNOSIS — C787 Secondary malignant neoplasm of liver and intrahepatic bile duct: Secondary | ICD-10-CM

## 2015-05-05 DIAGNOSIS — C250 Malignant neoplasm of head of pancreas: Secondary | ICD-10-CM

## 2015-05-05 DIAGNOSIS — D6481 Anemia due to antineoplastic chemotherapy: Secondary | ICD-10-CM

## 2015-05-05 DIAGNOSIS — D638 Anemia in other chronic diseases classified elsewhere: Secondary | ICD-10-CM

## 2015-05-05 DIAGNOSIS — D500 Iron deficiency anemia secondary to blood loss (chronic): Secondary | ICD-10-CM

## 2015-05-05 DIAGNOSIS — I10 Essential (primary) hypertension: Secondary | ICD-10-CM

## 2015-05-05 DIAGNOSIS — R944 Abnormal results of kidney function studies: Secondary | ICD-10-CM

## 2015-05-05 DIAGNOSIS — N1330 Unspecified hydronephrosis: Secondary | ICD-10-CM

## 2015-05-05 DIAGNOSIS — C786 Secondary malignant neoplasm of retroperitoneum and peritoneum: Secondary | ICD-10-CM | POA: Diagnosis not present

## 2015-05-05 DIAGNOSIS — G893 Neoplasm related pain (acute) (chronic): Secondary | ICD-10-CM

## 2015-05-05 LAB — CANCER ANTIGEN 19-9: CA 19 9: 9.8 U/mL (ref <35.0)

## 2015-05-05 NOTE — Progress Notes (Signed)
[facility] OFFICE PROGRESS NOTE   Diagnosis: Pancreas cancer  INTERVAL HISTORY:   Ms. Hise returns as scheduled. She underwent a paracentesis 05/02/2015 for 5 L. The abdomen is still distended. Her extremities are cold. No pain.   Objective:  Vital signs in last 24 hours:  Blood pressure 103/71, pulse 116, temperature 97.9 F (36.6 C), temperature source Oral, resp. rate 18, height 5\' 4"  (1.626 m), weight 165 lb 1.6 oz (74.889 kg), SpO2 100 %.    Resp: Lungs clear bilaterally Cardio: Rate and rhythm GI: The abdomen is distended with increased fullness the right lower quadrant Vascular: 1+ edema below the knee bilaterally   Portacath/PICC-without erythema  Lab Results:  Lab Results  Component Value Date   WBC 15.4* 05/04/2015   HGB 11.4* 05/04/2015   HCT 35.4 05/04/2015   MCV 83.8 05/04/2015   PLT 264 05/04/2015   NEUTROABS 13.5* 05/04/2015   potassium 3.9, BUN 20.3, creatinine 2.3   Lab Results  Component Value Date   CEA 277.8* 04/14/2015    Imaging:  Ct Abdomen Pelvis Wo Contrast  05/04/2015   CLINICAL DATA:  Restaging metastatic pancreatic adenocarcinoma. Biliary stent placement and cholecystectomy in 2014. Left ureteral stenting 4 months ago. Subsequent encounter.  EXAM: CT ABDOMEN AND PELVIS WITHOUT CONTRAST  TECHNIQUE: Multidetector CT imaging of the abdomen and pelvis was performed following the standard protocol without IV contrast.  COMPARISON:  Abdominal ultrasound 05/02/2015.  CT 02/11/2015.  FINDINGS: Lower chest: Clear lung bases. No pleural or pericardial effusion. Central venous catheter tip at the SVC right atrial junction.  Hepatobiliary: Interval improvement in multifocal hepatic metastatic disease. The lesions are not as well seen without contrast, but the largest lesion currently measures 11 mm in the dome of the right hepatic lobe on image 23 (previously 17 mm). There is persistent pneumobilia. Metallic biliary stent remains  in place. Previous cholecystectomy.  Pancreas: The pancreatic mass is not well defined without contrast. Ill-defined enlargement of the pancreatic head is grossly stable. There is stable atrophy and pancreatic ductal dilatation in the body and tail.  Spleen: Normal in size without focal abnormality.  Adrenals/Urinary Tract: Both adrenal glands appear normal.There is a stable hyperdense lesion measuring 1.6 cm anteriorly in the mid right kidney on image 43. Double-J left ureteral stent remains within the upper pole moiety of an apparent duplicated left collecting system. There is persistent marked duplication of the lower pole moiety collecting system with associated cortical thinning. The distal end of the ureteral stent appears unchanged within a collapsed bladder.  Stomach/Bowel: The stomach and small bowel demonstrate no significant findings. There is mild diffuse colonic wall thickening, attributed to the patient's ascites. No evidence of bowel obstruction or perforation.  Vascular/Lymphatic: Small retroperitoneal lymph nodes are stable. There are stable small inguinal lymph nodes. No significant vascular abnormalities identified on noncontrast imaging.  Reproductive: Intrauterine device remains in place. The uterus has a stable appearance. No evidence of adnexal mass.  Other: A large amount of ascites is again noted. There is mild peritoneal nodularity. The large midline complex cystic and solid mass has enlarged, measuring 21.3 x 13.2 cm transverse and 17.5 cm cephalocaudad. This displaces bowel from the mid abdomen.  Musculoskeletal: Relative sclerosis within the T11 and L1 vertebral bodies is similar to prior studies. No lytic lesion, pathologic fracture or epidural tumor identified.  IMPRESSION: 1. Progressive enlargement of dominant complex solid and cystic lesion within the mid abdomen, measuring up to 21 cm in diameter. This is  likely a large peritoneal metastasis. 2. Large volume of ascites, similar to  prior study. 3. Hepatic metastatic disease appears improved. Known pancreatic lesion is not well demonstrated without contrast. 4. Persistent pneumobilia consistent with patency of the biliary stent. 5. 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 Ureteral stent appears to extend into the upper pole moiety. 6. Vertebral body sclerosis appears unchanged, possibly related to prior radiation therapy.   Electronically Signed   By: Richardean Sale M.D.   On: 05/04/2015 18:44   US Paracentesis  05/02/2015   INDICATION: Pancreatic cancer, recurrent ascites. Request is made for therapeutic paracentesis.  EXAM: ULTRASOUND-GUIDED THERAPEUTIC PARACENTESIS  COMPARISON:  Prior paracentesis on 04/21/2015  MEDICATIONS: None.  COMPLICATIONS: None immediate  TECHNIQUE: Informed written consent was obtained from the patient after a discussion of the risks, benefits and alternatives to treatment. A timeout was performed prior to the initiation of the procedure.  Initial ultrasound scanning demonstrates a large amount of ascites within the left lower abdominal quadrant. The left lower abdomen was prepped and draped in the usual sterile fashion. 1% lidocaine was used for local anesthesia. Under direct ultrasound guidance, a 19 gauge, 10-cm, Yueh catheter was introduced. An ultrasound image was saved for documentation purposed. The paracentesis was performed. The catheter was removed and a dressing was applied. The patient tolerated the procedure well without immediate post procedural complication.  FINDINGS: A total of approximately 5 liters of slightly hazy, yellow fluid was removed.  IMPRESSION: Successful ultrasound-guided therapeutic paracentesis yielding 5 liters of peritoneal fluid.  Read by: Rowe Robert, PA-C   Electronically Signed   By: Lucrezia Europe M.D.   On: 05/02/2015 15:10    Medications: I have reviewed the patient's current medications.  Assessment/Plan: 1. Clinical  stage III (T4 N1) adenocarcinoma of the pancreas. Initiation of FOLFIRINOX 10/29/2013. She did not complete the 5-FU pump due to developing altered speech. Cycle 2 completed 11/12/2013. Cycle 3 completed 11/26/2013. Cycle 4 completed 12/09/2013, cycle 5 12/24/2013, cycle 6 01/14/2014, cycle 7 01/28/2014, cycle 8 02/11/2014, cycle 9 02/25/2014, cycle 10 03/11/2014  Restaging CT 01/06/2012 with a slight increase in the size of the pancreas mass and no clear evidence of distant metastatic disease.   CEA improved 01/28/2014.   Cycle 1 of modified FOLFIRI 03/25/2014.   Cycle 2 modified FOLFIRI 04/08/2014.   CEA stable 04/26/2014.   Restaging CT abdomen/pelvis 04/26/2014 with interval decrease in the size of the pancreatic head mass. Stable mesenteric and retroperitoneal lymphadenopathy. Stable splenomegaly. 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 Stable small liver lesions consistent with benign cysts.   Continuation of FOLFIRI on a 3 week schedule.   CEA increased (13.8) on 06/03/2014.   CEA increased (23.3) on 07/08/2014.   Restaging CT evaluation 08/03/2014 with mild interval enlargement of the pancreatic head mass. Slight increase in the size of small central mesenteric lymph nodes. 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 Common bile duct stent in place without evidence of biliary duct dilatation. Stable small hypodensities within the liver.   Initiation of concurrent capecitabine and radiation 08/19/2014, completed 09/24/2014  CEA mildly improved on 10/18/2014 (19.9).  Restaging CT 11/25/2014 with an enlarged pancreas body mass, new liver lesions, retroperitoneal soft tissue mass obstructing the left ureter, new ascites, loculated peritoneal fluid superior to the bladder  Initiation of gemcitabine/Abraxane day one/day 8 of a 21 day cycle 12/08/2014.  Day 8 treatment held on  12/16/2014 due to neutropenia and thrombocytopenia.  Cycle 2 gemcitabine/Abraxane day 1 12/24/2014.  Cycle 2 day 8 held on 12/31/2014 due to neutropenia  Gemcitabine/Abraxane schedule adjusted to every 2 weeks beginning 01/07/2015. Last given 02/04/2015  CT 02/11/2015 confirmed progression of a cystic/solid anterior peritoneal mass, progressive liver lesions, and suspicion for a metastasis at L1  Paracentesis 02/16/2015 with cytology confirming metastatic adenocarcinoma  Salvage therapy with FOLFIRINOX initiated 03/03/2015  CT 05/04/2015 after 4 cycles of salvage FOLFIRINOX-enlargement of cystic midabdominal mass, improved hepatic metastases, persistent left hydronephrosis  2. Altered speech prior to completion of cycle 1 of FOLFIRINOX, similar symptoms and tongue/lip twitching following the oxaliplatin with cycle 2 FOLFIRINOX. Oxaliplatin was dose reduced and given over 4 hours beginning with cycle 3. She had similar neurologic symptoms after subsequent cycles of FOLFIRINOX. 3. Status post cholecystectomy 10/02/2013 with pathology revealing chronic cholecystitis. 4. Abdomen/back pain secondary to #1. Improved following Xeloda/radiation 5. Hypertension. 6. Status post Port-A-Cath placement 10/28/2013. 7. History of elevated liver enzyme-improved. 8. History of hypokalemia.  9. History of neutropenia secondary to chemotherapy. Neulasta was added beginning with cycle 6 of FOLFIRINOX on 01/14/2014. 10. Delayed nausea. Improved with the addition of Aloxi. 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 12. Low back pain radiating to the left lateral leg status post negative plain x-ray evaluation 11/01/2014. 13. Left hydronephrosis and hydroureter secondary to a retroperitoneal soft tissue mass on the CT 11/25/2014.   Status post left double-J stent placement 12/13/2014 by Dr. Risa Grill.  Persistent left hydronephrosis on CT 05/04/2015 14. Elevated creatinine-progressive rise in creatinine [place] 2016, CT  05/04/2015 reveals persistent left hydronephrosis 15. Loose stools after meals. Question pancreatic insufficiency. Pancreatic enzyme replacement initiated 12/31/2014. Improved. 16. Anemia secondary to chronic disease, chemotherapy, and rectal bleeding-status post a red cell transfusion 02/11/2015 and 04/28/2015 17. Hypokalemia secondary to diarrhea and diuretics  Disposition:  Frances [name] appears unchanged. The restaging CT reveals partial improvement in the liver metastases and no clear evidence of disease progression. The creatinine is elevated and there is persistent left hydronephrosis. I reviewed the CT images with interventional radiology and discussed the case with Dr. Risa Grill. 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 Dr. Risa Grill recommends a percutaneous nephrostomy tube. I discussed options with Ms. Berland her husband. She agrees to proceed with a nephrostomy tube. We will place further chemotherapy on hold and follow the creatinine once the nephrostomy tube is in place. The plan is to resume FOLFIRINOX if the creatinine improves.  She will return for an office visit 05/19/2015. We will check the creatinine next week.  Betsy Coder, MD  05/05/2015  8:54 AM

## 2015-05-05 NOTE — Telephone Encounter (Signed)
Per staff message and POF I have scheduled appts. Advised scheduler of appts. JMW  

## 2015-05-05 NOTE — Telephone Encounter (Signed)
per pof to sch pt appt-sent MW meail to sch pt trmt-gave pt copy of sch

## 2015-05-06 ENCOUNTER — Ambulatory Visit (HOSPITAL_COMMUNITY)
Admission: RE | Admit: 2015-05-06 | Discharge: 2015-05-06 | Disposition: A | Discharge status: Home / Self Care | Payer: 59 | Source: Ambulatory Visit | Attending: Oncology | Admitting: Oncology

## 2015-05-06 ENCOUNTER — Other Ambulatory Visit: Payer: Self-pay | Admitting: Radiology

## 2015-05-06 ENCOUNTER — Other Ambulatory Visit: Payer: Self-pay | Admitting: Oncology

## 2015-05-06 ENCOUNTER — Encounter (HOSPITAL_COMMUNITY): Payer: Self-pay

## 2015-05-06 DIAGNOSIS — C250 Malignant neoplasm of head of pancreas: Secondary | ICD-10-CM | POA: Diagnosis not present

## 2015-05-06 DIAGNOSIS — K219 Gastro-esophageal reflux disease without esophagitis: Secondary | ICD-10-CM | POA: Insufficient documentation

## 2015-05-06 DIAGNOSIS — N1330 Unspecified hydronephrosis: Secondary | ICD-10-CM | POA: Diagnosis not present

## 2015-05-06 DIAGNOSIS — R180 Malignant ascites: Secondary | ICD-10-CM | POA: Diagnosis not present

## 2015-05-06 DIAGNOSIS — Z8249 Family history of ischemic heart disease and other diseases of the circulatory system: Secondary | ICD-10-CM | POA: Diagnosis not present

## 2015-05-06 DIAGNOSIS — I10 Essential (primary) hypertension: Secondary | ICD-10-CM | POA: Diagnosis not present

## 2015-05-06 DIAGNOSIS — Z79899 Other long term (current) drug therapy: Secondary | ICD-10-CM | POA: Insufficient documentation

## 2015-05-06 DIAGNOSIS — Z923 Personal history of irradiation: Secondary | ICD-10-CM | POA: Diagnosis not present

## 2015-05-06 LAB — BASIC METABOLIC PANEL
Anion gap: 9 (ref 5–15)
BUN: 20 mg/dL (ref 6–20)
CHLORIDE: 112 mmol/L — AB (ref 101–111)
CO2: 15 mmol/L — ABNORMAL LOW (ref 22–32)
Calcium: 7.6 mg/dL — ABNORMAL LOW (ref 8.9–10.3)
Creatinine, Ser: 2.54 mg/dL — ABNORMAL HIGH (ref 0.44–1.00)
GFR calc non Af Amer: 22 mL/min — ABNORMAL LOW (ref >60)
GFR, EST AFRICAN AMERICAN: 25 mL/min — AB (ref >60)
Glucose, Bld: 98 mg/dL (ref 65–99)
Potassium: 4.5 mmol/L (ref 3.5–5.1)
SODIUM: 136 mmol/L (ref 135–145)

## 2015-05-06 LAB — PROTIME-INR
INR: 1.57 — AB (ref 0.00–1.49)
PROTHROMBIN TIME: 18.8 s — AB (ref 11.6–15.2)

## 2015-05-06 LAB — CBC
HEMATOCRIT: 35.4 % — AB (ref 36.0–46.0)
HEMOGLOBIN: 11.7 g/dL — AB (ref 12.0–15.0)
MCH: 27.4 pg (ref 26.0–34.0)
MCHC: 33.1 g/dL (ref 30.0–36.0)
MCV: 82.9 fL (ref 78.0–100.0)
Platelets: 238 10*3/uL (ref 150–400)
RBC: 4.27 MIL/uL (ref 3.87–5.11)
RDW: 21.5 % — ABNORMAL HIGH (ref 11.5–15.5)
WBC: 14.6 10*3/uL — ABNORMAL HIGH (ref 4.0–10.5)

## 2015-05-06 LAB — APTT: aPTT: 37 seconds (ref 24–37)

## 2015-05-06 MED ORDER — HYDROCODONE-ACETAMINOPHEN 5-325 MG PO TABS: 1.0000 | ORAL_TABLET | ORAL | Status: DC | PRN

## 2015-05-06 MED ORDER — SODIUM CHLORIDE 0.9 % IV SOLN
Freq: Once | INTRAVENOUS | Status: DC
Start: 2015-05-06 — End: 2015-05-07

## 2015-05-06 MED ORDER — LIDOCAINE HCL 1 % IJ SOLN
INTRAMUSCULAR | Status: AC
  Filled 2015-05-06: qty 20

## 2015-05-06 MED ORDER — MIDAZOLAM HCL 2 MG/2ML IJ SOLN
INTRAMUSCULAR | Status: AC | PRN
  Administered 2015-05-06: 1 mg via INTRAVENOUS

## 2015-05-06 MED ORDER — FENTANYL CITRATE (PF) 100 MCG/2ML IJ SOLN
INTRAMUSCULAR | Status: AC | PRN
  Administered 2015-05-06: 50 ug via INTRAVENOUS
  Administered 2015-05-06: 25 ug via INTRAVENOUS

## 2015-05-06 MED ORDER — IOHEXOL 300 MG/ML  SOLN
50.0000 mL | Freq: Once | INTRAMUSCULAR | Status: AC | PRN
  Administered 2015-05-06: 10 mL

## 2015-05-06 MED ORDER — CIPROFLOXACIN IN D5W 400 MG/200ML IV SOLN
400.0000 mg | Freq: Once | INTRAVENOUS | Status: AC
  Administered 2015-05-06: 400 mg via INTRAVENOUS
  Filled 2015-05-06: qty 200

## 2015-05-06 MED ORDER — FENTANYL CITRATE (PF) 100 MCG/2ML IJ SOLN
INTRAMUSCULAR | Status: AC
  Filled 2015-05-06: qty 4

## 2015-05-06 MED ORDER — CEFAZOLIN SODIUM-DEXTROSE 2-3 GM-% IV SOLR: 2.0000 g | Freq: Once | INTRAVENOUS | Status: DC

## 2015-05-06 MED ORDER — MIDAZOLAM HCL 2 MG/2ML IJ SOLN
INTRAMUSCULAR | Status: AC
  Filled 2015-05-06: qty 4

## 2015-05-06 NOTE — Sedation Documentation (Signed)
O2 Sats not registering. Probe placed on several different areas. Pt. Currently wearing mittens.

## 2015-05-06 NOTE — Discharge Instructions (Signed)
Percutaneous Nephrostomy, Care After °Refer to this sheet in the next few weeks. These instructions provide you with information on caring for yourself after your procedure. Your health care provider may also give you more specific instructions. Your treatment has been planned according to current medical practices, but problems sometimes occur. Call your health care provider if you have any problems or questions after your procedure. °WHAT TO EXPECT AFTER THE PROCEDURE °You will need to remain lying down for several hours. °HOME CARE INSTRUCTIONS °· Your nephrostomy tube is connected to a leg bag or bedside drainage bag. Always keep the tubing, the leg bag, or the bedside drainage bags below the level of the kidney so that the urine drains freely. °· During the day, if you are connecting the nephrostomy tube to a leg bag, be sure there are no kinks in the tubing and that the urine is draining freely. °· At night, you may want to connect the nephrostomy tube or the leg bag to a larger bedside drainage bag. °· Change the dressing as often as directed by your health care provider, or if it becomes wet. °¨ Gently remove the tapes and dressing from around the nephrostomy tube. Be careful not to pull on the tube while removing the dressing. °¨ Wash the skin around the tube, rinse well, and dry. °¨ Place two split drain sponges in and around the tube exit site. °¨ Place tape around edge of the dressing. °¨ Secure the nephrostomy tubing. Remember to make certain that the nephrostomy tube does not kink or become pinched closed. It can be useful to wrap any exposed tubing going from the nephrostomy tube to any of the connecting tubes to either the leg bag or drainage bag with an elastic bandage. °· Every three weeks, replace the leg bag, drainage bag, and any extension tubing connected to your nephrostomy tube. Your health care provider will explain how to change the drainage bag and extension tubing. °SEEK MEDICAL CARE  IF: °· You experience any problems with any of the valves or tubing. °· You have persistent pain or soreness in your back. °· You have a fever or chills. °SEEK IMMEDIATE MEDICAL CARE IF: °· You have abdominal pain during the first week. °· You have a new appearance of blood in your urine. °· You have back pain that is not relieved by your medicine. °· You have drainage, redness, swelling, or pain at the tube insertion site. °· You have decreased urine output. °· Your nephrostomy tube comes out. °Document Released: 07/19/2004 Document Revised: 04/12/2014 Document Reviewed: 07/23/2013 °[employer]® Patient Information ©2015 [employer]. This information is not intended to replace advice given to you by your health care provider. Make sure you discuss any questions you have with your health care provider. ° °

## 2015-05-06 NOTE — H&P (Signed)
Referring Physician(s): Sherrill,Gary B  History of Present Illness: Frances [name] is a 49 y.o. female with metastatic pancreatic cancer follows with Dr. Benay Spice undergoing chemotherapy and known malignant ascites s/p multiple large volume paracentesis. Recent CT on 5/25 with enlargement of cystic abdominal mass, improved hepatic metastasis and persistent Left hydronephrosis with worsening renal function, known duplicated collecting system. The patient has a history of left double J stent performed by Dr. Risa Grill. She has been seen by Dr. Benay Spice on 05/05/15 and scheduled today for image guided left percutaneous nephrostomy tube(s) placement and possible paracentesis. She denies any chest pain, admits to DOE. She denies any active signs of bleeding or excessive bruising. She denies any recent fever or chills. The patient denies any history of sleep apnea or chronic oxygen use. She has previously tolerated sedation without complications. She does c/o urinary urgency and hesitancy without dysuria.  She c/o abdominal distention.   Past Medical History  Diagnosis Date  . Hypertension   . Seasonal allergies   . GERD (gastroesophageal reflux disease)   . Wears glasses   . Headache     otc med prn  . Anemia   . Hx of radiation therapy 08/19/14- 09/24/14    pancreas/regional lymph nodes 4500 cGy 25 sessions  . History of gastritis   . 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 History of herpes genitalis   . Hydronephrosis, left   . Maintenance chemotherapy     PALLITIVE    Past Surgical History  Procedure Laterality Date  . Cesarean section  2005  . Cholecystectomy N/A 10/02/2013    Procedure: LAPAROSCOPIC CHOLECYSTECTOMY WITH  INTRAOPERATIVE CHOLANGIOGRAM POSSIBLE OPEN ;  Surgeon: Adin Hector, MD;  Location: [facility];  Service: General;  Laterality: N/A;  . Eus N/A 10/12/2013    Procedure: UPPER ENDOSCOPIC ULTRASOUND (EUS) LINEAR;  Surgeon: Milus Banister, MD;  Location: [facility];  Service: Endoscopy;  Laterality: N/A;  . Pancreas biopsy  10/12/13    FNA- pancreas head: malignant cells consistent w/adenocarcinoma  . Endoscopic retrograde cholangiopancreatography (ercp) with propofol N/A 10/27/2013    Procedure: ENDOSCOPIC RETROGRADE CHOLANGIOPANCREATOGRAPHY (ERCP) WITH PROPOFOL;  Surgeon: Milus Banister, MD;  Location: [facility];  Service: Endoscopy;  Laterality: N/A;  biliary stenting  . Portacath placement Left 10/28/2013    Procedure: INSERTION PORT-A-CATH LET SUBCLAVIAN;  Surgeon: Adin Hector, MD;  Location: [facility];  Service: General;  Laterality: Left;  . Intrauterine device (iud) insertion N/A 10/15/2014    Procedure: cervical dilation  with INTRAUTERINE DEVICE (IUD) INSERTION ;  Surgeon: Luz Lex, MD;  Location: [facility];  Service: Gynecology;  Laterality: N/A;  mirena  . Cystoscopy with stent placement Left 12/13/2014    Procedure: CYSTOSCOPY WITH STENT PLACEMENT;  Surgeon: Bernestine Amass, MD;  Location: [facility];  Service: Urology;  Laterality: Left;  . Cystoscopy w/ retrogrades Left 12/13/2014    Procedure: CYSTOSCOPY WITH RETROGRADE PYELOGRAM, LEFT URETEROSCOPY;  Surgeon: Bernestine Amass, MD;  Location: [facility];  Service: Urology;  Laterality: Left;    Allergies: Review of patient's allergies indicates no known allergies.  Medications: Prior to Admission medications   Medication Sig  Start Date End Date Taking? Authorizing Provider  acetaminophen (TYLENOL) 500 MG tablet Take 1,000 mg by mouth every 6 (six) hours as needed (pain).    Yes Historical Provider, MD  lactulose (CHRONULAC) 10 GM/15ML solution Take 20 g by mouth as needed.   02/04/15  Yes Historical Provider, MD  lidocaine-prilocaine (EMLA) cream Apply 1 application topically as needed. Apply to [facility] site 1-2 hours prior to stick and cover with plastic wrap 12/31/14  Yes Owens Shark, NP  lipase/protease/amylase (CREON) 12000 UNITS CPEP capsule Take 3 capsules (36,000 Units total) by mouth 3 (three) times daily before meals. 12/31/14  Yes Owens Shark, NP  naproxen sodium (ANAPROX) 220 MG tablet Take 220 mg by mouth 2 (two) times daily with a meal.   Yes Historical Provider, MD  omeprazole (PRILOSEC) 40 MG capsule Take 1 capsule (40 mg total) by mouth 2 (two) times daily. 04/28/15  Yes Ladell Pier, MD  ondansetron (ZOFRAN) 8 MG tablet Take 8 mg by mouth every 8 (eight) hours as needed for nausea or vomiting (start taking 72 hours after chemo tx).   Yes Historical Provider, MD  oxyCODONE-acetaminophen (PERCOCET/ROXICET) 5-325 MG per tablet Take 1 tablet by mouth every 4 (four) hours as needed for severe pain. 03/15/15  Yes Ladell Pier, MD  polyethylene glycol powder (GLYCOLAX/MIRALAX) powder Take 1 Container by mouth as needed for mild constipation or moderate constipation.    Yes Historical Provider, MD  potassium chloride SA (K-DUR,KLOR-CON) 20 MEQ tablet Take 1 tablet (20 mEq total) by mouth 3 (three) times daily. 03/31/15  Yes Ladell Pier, MD  promethazine (PHENERGAN) 25 MG tablet Take 25 mg by mouth every 6 (six) hours as needed for nausea or vomiting.   Yes Historical Provider, MD  sennosides-docusate sodium (SENOKOT-S) 8.6-50 MG tablet Take 2 tablets by mouth daily.   Yes Historical Provider, MD  valACYclovir (VALTREX) 1000 MG tablet Take 1,000 mg by mouth daily as needed.    Yes Historical Provider, MD  hydrochlorothiazide (HYDRODIURIL) 25 MG tablet TAKE 1 TABLET BY MOUTH EVERY EVENING Patient not taking: Reported on 05/05/2015 04/18/15   Ladell Pier, MD     Family History  Problem Relation Age of Onset  . Hypertension Mother   . Diabetes Maternal  Grandfather     History   Social History  . Marital Status: Married    Spouse Name: Arnell Sieving  . Number of Children: 1  . Years of Education: N/A   Occupational History  .     Social History Main Topics  . Smoking status: Never Smoker   . Smokeless tobacco: Never Used  . Alcohol Use: No  . Drug Use: No  . Sexual Activity: Yes    Birth Control/ Protection: IUD   Other Topics Concern  . None   Social History Narrative   Married, husband Arnell Sieving   One son, Liane Comber [phone])   Employed by [employer]   Review of Systems: A 12 point ROS discussed and pertinent positives are indicated in the HPI above.  All other systems are negative.  Review of Systems  Vital Signs: BP 120/83 mmHg  Pulse 117  Temp(Src) 97.6 F (36.4 C)  Resp 20  Ht 5\' 5"  (1.651 m)  Wt 165 lb (74.844 kg)  BMI 27.46 kg/m2  SpO2 95%  Physical Exam  Constitutional: She is oriented to person, place, and time.  HENT:  Head: Normocephalic and atraumatic.  Neck: No tracheal deviation present.  Cardiovascular:  Regular rhythm.  Exam reveals no gallop and no friction rub.   No murmur heard. Tachycardic   Pulmonary/Chest: Effort normal and breath sounds normal. No respiratory distress. She has no wheezes. She has no rales.  Abdominal: Soft. Bowel sounds are normal. She exhibits distension. There is no tenderness.  Neurological: She is alert and oriented to person, place, and time.  Skin: Skin is warm and dry.  Left port intact    Mallampati Score:  MD Evaluation Airway: WNL Heart: WNL Abdomen: WNL Chest/ Lungs: WNL ASA  Classification: 3 Mallampati/Airway Score: Two  Imaging: Ct Abdomen Pelvis Wo Contrast  05/04/2015   CLINICAL DATA:  Restaging metastatic pancreatic adenocarcinoma. Biliary stent placement and cholecystectomy in 2014. Left ureteral stenting 4 months ago. Subsequent encounter.  EXAM: CT ABDOMEN AND PELVIS WITHOUT CONTRAST  TECHNIQUE: Multidetector CT imaging of the abdomen and pelvis  was performed following the standard protocol without IV contrast.  COMPARISON:  Abdominal ultrasound 05/02/2015.  CT 02/11/2015.  FINDINGS: Lower chest: Clear lung bases. No pleural or pericardial effusion. Central venous catheter tip at the SVC right atrial junction.  Hepatobiliary: Interval improvement in multifocal hepatic metastatic disease. The lesions are not as well seen without contrast, but the largest lesion currently measures 11 mm in the dome of the right hepatic lobe on image 23 (previously 17 mm). There is persistent pneumobilia. Metallic biliary stent remains in place. Previous cholecystectomy.  Pancreas: The pancreatic mass is not well defined without contrast. Ill-defined enlargement of the pancreatic head is grossly stable. There is stable atrophy and pancreatic ductal dilatation in the body and tail.  Spleen: Normal in size without focal abnormality.  Adrenals/Urinary Tract: Both adrenal glands appear normal.There is a stable hyperdense lesion measuring 1.6 cm anteriorly in the mid right kidney on image 43. Double-J left ureteral stent remains within the upper pole moiety of an apparent duplicated left collecting system. There is persistent marked duplication of the lower pole moiety collecting system with associated cortical thinning. The distal end of the ureteral stent appears unchanged within a collapsed bladder.  Stomach/Bowel: The stomach and small bowel demonstrate no significant findings. There is mild diffuse colonic wall thickening, attributed to the patient's ascites. No evidence of bowel obstruction or perforation.  Vascular/Lymphatic: Small retroperitoneal lymph nodes are stable. There are stable small inguinal lymph nodes. No significant vascular abnormalities identified on noncontrast imaging.  Reproductive: Intrauterine device remains in place. The uterus has a stable appearance. No evidence of adnexal mass.  Other: A large amount of ascites is again noted. There is mild  peritoneal nodularity. The large midline complex cystic and solid mass has enlarged, measuring 21.3 x 13.2 cm transverse and 17.5 cm cephalocaudad. This displaces bowel from the mid abdomen.  Musculoskeletal: Relative sclerosis within the T11 and L1 vertebral bodies is similar to prior studies. No lytic lesion, pathologic fracture or epidural tumor identified.  IMPRESSION: 1. Progressive enlargement of dominant complex solid and cystic lesion within the mid abdomen, measuring up to 21 cm in diameter. This is likely a large peritoneal metastasis. 2. Large volume of ascites, similar to prior study. 3. Hepatic metastatic disease appears improved. Known pancreatic lesion is not well demonstrated without contrast. 4. Persistent pneumobilia consistent with patency of the biliary stent. 5. 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 Ureteral stent appears to extend into the upper pole moiety. 6. Vertebral body sclerosis appears unchanged, possibly related to prior radiation therapy.   Electronically Signed   By:  Richardean Sale M.D.   On: 05/04/2015 18:44   US Paracentesis  05/02/2015   INDICATION: Pancreatic cancer, recurrent ascites. Request is made for therapeutic paracentesis.  EXAM: ULTRASOUND-GUIDED THERAPEUTIC PARACENTESIS  COMPARISON:  Prior paracentesis on 04/21/2015  MEDICATIONS: None.  COMPLICATIONS: None immediate  TECHNIQUE: Informed written consent was obtained from the patient after a discussion of the risks, benefits and alternatives to treatment. A timeout was performed prior to the initiation of the procedure.  Initial ultrasound scanning demonstrates a large amount of ascites within the left lower abdominal quadrant. The left lower abdomen was prepped and draped in the usual sterile fashion. 1% lidocaine was used for local anesthesia. Under direct ultrasound guidance, a 19 gauge, 10-cm, Yueh catheter was introduced. An ultrasound image was saved for  documentation purposed. The paracentesis was performed. The catheter was removed and a dressing was applied. The patient tolerated the procedure well without immediate post procedural complication.  FINDINGS: A total of approximately 5 liters of slightly hazy, yellow fluid was removed.  IMPRESSION: Successful ultrasound-guided therapeutic paracentesis yielding 5 liters of peritoneal fluid.  Read by: Rowe Robert, PA-C   Electronically Signed   By: Lucrezia Europe M.D.   On: 05/02/2015 15:10   US Paracentesis  04/22/2015   INDICATION: Pancreatic cancer, recurrent ascites. Request is made for therapeutic paracentesis.  EXAM: ULTRASOUND-GUIDED THERAPEUTIC PARACENTESIS  COMPARISON:  Prior paracentesis on 04/18/2015  MEDICATIONS: None.  COMPLICATIONS: None immediate  TECHNIQUE: Informed written consent was obtained from the patient after a discussion of the risks, benefits and alternatives to treatment. A timeout was performed prior to the initiation of the procedure.  Initial ultrasound scanning demonstrates a moderate to large amount of ascites within the left mid to lower abdominal quadrant. The left mid to lower abdomen was prepped and draped in the usual sterile fashion. 1% lidocaine was used for local anesthesia. Under direct ultrasound guidance, a 19 gauge, 10-cm, Yueh catheter was introduced. An ultrasound image was saved for documentation purposed. The paracentesis was performed. The catheter was removed and a dressing was applied. The patient tolerated the procedure well without immediate post procedural complication.  FINDINGS: A total of approximately 4 liters of slightly hazy, yellow fluid was removed.  IMPRESSION: Successful ultrasound-guided therapeutic paracentesis yielding 4 liters of peritoneal fluid.  Read by: Rowe Robert, PA-C   Electronically Signed   By: Marybelle Killings M.D.   On: 04/21/2015 17:19   US Paracentesis  04/18/2015   INDICATION: Pancreatic cancer, recurrent ascites. Request is made for  therapeutic paracentesis.  EXAM: ULTRASOUND-GUIDED THERAPEUTIC PARACENTESIS  COMPARISON:  Prior paracentesis on 03/30/2015  MEDICATIONS: None.  COMPLICATIONS: None immediate  TECHNIQUE: Informed written consent was obtained from the patient after a discussion of the risks, benefits and alternatives to treatment. A timeout was performed prior to the initiation of the procedure.  Initial ultrasound scanning demonstrates a moderate to large amount of ascites within the right mid to lower abdominal quadrant. The right mid to lower abdomen was prepped and draped in the usual sterile fashion. 1% lidocaine with epinephrine was used for local anesthesia. Under direct ultrasound guidance, a 19 gauge, 10-cm, Yueh catheter was introduced. An ultrasound image was saved for documentation purposed. The paracentesis was performed. The catheter was removed and a dressing was applied. The patient tolerated the procedure well without immediate post procedural complication.  FINDINGS: A total of approximately 4 liters of slightly hazy, yellow fluid was removed.  IMPRESSION: Successful ultrasound-guided therapeutic paracentesis yielding 4 liters of peritoneal fluid.  Read by: Rowe Robert, PA-C   Electronically Signed   By: Corrie Mckusick D.O.   On: 04/18/2015 16:18   US Paracentesis  04/08/2015   INDICATION: Pancreatic cancer, recurrent ascites and request for paracentesis.  EXAM: ULTRASOUND-GUIDED PARACENTESIS  COMPARISON:  03/30/15 Paracentesis.  MEDICATIONS: None.  COMPLICATIONS: None immediate  TECHNIQUE: Informed written consent was obtained from the patient after a discussion of the risks, benefits and alternatives to treatment. A timeout was performed prior to the initiation of the procedure.  Initial ultrasound scanning demonstrates a moderate amount of ascites within the left lower abdominal quadrant. The left lower abdomen was prepped and draped in the usual sterile fashion. 1% lidocaine was used for local anesthesia.  An  ultrasound image was saved for documentation purposed. A 6 Fr Safe-T-Centesis catheter was introduced. The paracentesis was performed. The catheter was removed and a dressing was applied. The patient tolerated the procedure well without immediate post procedural complication.  FINDINGS: A total of approximately 4 liters of serous fluid was removed.  IMPRESSION: Successful ultrasound-guided paracentesis yielding 4 liters of peritoneal fluid.  Read By:  Tsosie Billing PA-C   Electronically Signed   By: Marybelle Killings M.D.   On: 04/08/2015 13:20    Labs:  CBC:  Recent Labs  04/14/15 0902 04/28/15 0915 05/04/15 1540 05/06/15 0721  WBC 16.3* 15.4* 15.4* 14.6*  HGB 8.5* 7.4* 11.4* 11.7*  HCT 26.5* 24.0* 35.4 35.4*  PLT 209 185 264 238    COAGS:  Recent Labs  05/06/15 0721  INR 1.57*  APTT 37    BMP:  Recent Labs  10/12/14 0844  04/14/15 0902 04/28/15 0915 05/04/15 1540 05/06/15 0721  NA 138  < > 140 141 139 136  K 2.9*  < > 2.4* 2.8* 3.9 4.5  CL 98  --   --   --   --  112*  CO2 30  < > 17* 15* 14* 15*  GLUCOSE 120*  < > 105 92 87 98  BUN 5*  < > 17.0 18.8 20.3 20  CALCIUM 8.6  < > 6.9* 6.8* 6.9* 7.6*  CREATININE 1.32*  < > 2.1* 2.3* 2.3* 2.54*  GFRNONAA 48*  --   --   --   --  22*  GFRAA 55*  --   --   --   --  25*  < > = values in this interval not displayed.  LIVER FUNCTION TESTS:  Recent Labs  03/31/15 0846 04/14/15 0902 04/28/15 0915 05/04/15 1540  BILITOT <0.20 0.24 0.23 0.29  AST 23 26 18 31   ALT 12 16 11 18   ALKPHOS 531* 374* 369* 364*  PROT 4.9* 4.7* 4.7* 4.8*  ALBUMIN 1.5* 1.5* 1.5* 1.5*    TUMOR MARKERS:  Recent Labs  12/08/14 1106 02/04/15 1205 03/03/15 0955 04/14/15 0902 05/04/15 1540  CEA 59.4* 207.5* 288.5* 277.8*  --   CA199 5.1  --   --   --  9.8    Assessment and Plan: Metastatic Pancreatic cancer follows with Dr. Benay Spice undergoing chemotherapy  Malignant ascites s/p multiple large volume paracentesis CT 5/25 with enlargement  of cystic abdominal mass, improved hepatic metastasis and persistent Left hydronephrosis with worsening renal function, known duplicated collecting system   History of left double J stent performed by Dr. Risa Grill  Seen by Dr. Benay Spice on 05/05/15 Scheduled today for image guided left percutaneous nephrostomy tube(s) placement and possible paracentesis with sedation The patient has been NPO, no blood thinners taken, labs and  vitals have been reviewed. Risks and Benefits discussed with the patient including, but not limited to infection, bleeding, significant bleeding causing loss or decrease in renal function or damage to adjacent structures.  All of the patient's questions were answered, patient is agreeable to proceed. Consent signed and in chart.   Thank you for this interesting consult.  I greatly enjoyed meeting Frances [name] and look forward to participating in their care.  SignedHedy Jacob 05/06/2015, 8:10 AM

## 2015-05-06 NOTE — Procedures (Signed)
Placement of left nephrostomy tube in lower pole collecting system.  No immediate complication.  Minimal blood loss.  Plan for paracentesis prior to discharge.

## 2015-05-06 NOTE — Procedures (Signed)
Successful US guided paracentesis from RUQ.  Yielded 4.5 Liters of clear yellow fluid.  No immediate complications.  Pt tolerated well.   Specimen was not sent for labs.  Gareth Eagle R PA-C 05/06/2015 11:03 AM

## 2015-05-07 ENCOUNTER — Ambulatory Visit: Payer: 59

## 2015-05-09 ENCOUNTER — Encounter (HOSPITAL_COMMUNITY): Payer: Self-pay | Admitting: Emergency Medicine

## 2015-05-09 ENCOUNTER — Emergency Department (HOSPITAL_COMMUNITY)
Admission: EM | Admit: 2015-05-09 | Discharge: 2015-05-09 | Disposition: A | Discharge status: Home / Self Care | Payer: 59 | Attending: Emergency Medicine | Admitting: Emergency Medicine

## 2015-05-09 DIAGNOSIS — Z862 Personal history of diseases of the blood and blood-forming organs and certain disorders involving the immune mechanism: Secondary | ICD-10-CM | POA: Diagnosis not present

## 2015-05-09 DIAGNOSIS — Z8507 Personal history of malignant neoplasm of pancreas: Secondary | ICD-10-CM | POA: Insufficient documentation

## 2015-05-09 DIAGNOSIS — K219 Gastro-esophageal reflux disease without esophagitis: Secondary | ICD-10-CM | POA: Diagnosis not present

## 2015-05-09 DIAGNOSIS — R0602 Shortness of breath: Secondary | ICD-10-CM | POA: Diagnosis present

## 2015-05-09 DIAGNOSIS — Z87448 Personal history of other diseases of urinary system: Secondary | ICD-10-CM | POA: Insufficient documentation

## 2015-05-09 DIAGNOSIS — Z79899 Other long term (current) drug therapy: Secondary | ICD-10-CM | POA: Insufficient documentation

## 2015-05-09 DIAGNOSIS — R188 Other ascites: Secondary | ICD-10-CM | POA: Diagnosis not present

## 2015-05-09 DIAGNOSIS — I10 Essential (primary) hypertension: Secondary | ICD-10-CM | POA: Insufficient documentation

## 2015-05-09 LAB — COMPREHENSIVE METABOLIC PANEL
ALBUMIN: 1.6 g/dL — AB (ref 3.5–5.0)
ALT: 22 U/L (ref 14–54)
AST: 25 U/L (ref 15–41)
Alkaline Phosphatase: 358 U/L — ABNORMAL HIGH (ref 38–126)
Anion gap: 6 (ref 5–15)
BILIRUBIN TOTAL: 0.3 mg/dL (ref 0.3–1.2)
BUN: 24 mg/dL — ABNORMAL HIGH (ref 6–20)
CO2: 15 mmol/L — ABNORMAL LOW (ref 22–32)
Calcium: 7.9 mg/dL — ABNORMAL LOW (ref 8.9–10.3)
Chloride: 112 mmol/L — ABNORMAL HIGH (ref 101–111)
Creatinine, Ser: 2.25 mg/dL — ABNORMAL HIGH (ref 0.44–1.00)
GFR calc Af Amer: 29 mL/min — ABNORMAL LOW (ref >60)
GFR calc non Af Amer: 25 mL/min — ABNORMAL LOW (ref >60)
Glucose, Bld: 103 mg/dL — ABNORMAL HIGH (ref 65–99)
Potassium: 5.8 mmol/L — ABNORMAL HIGH (ref 3.5–5.1)
Sodium: 133 mmol/L — ABNORMAL LOW (ref 135–145)
TOTAL PROTEIN: 5.6 g/dL — AB (ref 6.5–8.1)

## 2015-05-09 LAB — URINALYSIS, ROUTINE W REFLEX MICROSCOPIC
Bilirubin Urine: NEGATIVE
GLUCOSE, UA: NEGATIVE mg/dL
Ketones, ur: NEGATIVE mg/dL
NITRITE: NEGATIVE
PROTEIN: 100 mg/dL — AB
SPECIFIC GRAVITY, URINE: 1.02 (ref 1.005–1.030)
Urobilinogen, UA: 0.2 mg/dL (ref 0.0–1.0)
pH: 6 (ref 5.0–8.0)

## 2015-05-09 LAB — CBC WITH DIFFERENTIAL/PLATELET
BASOS PCT: 0 % (ref 0–1)
Basophils Absolute: 0 10*3/uL (ref 0.0–0.1)
EOS ABS: 0 10*3/uL (ref 0.0–0.7)
EOS PCT: 0 % (ref 0–5)
HCT: 39.5 % (ref 36.0–46.0)
HEMOGLOBIN: 12.7 g/dL (ref 12.0–15.0)
LYMPHS PCT: 6 % — AB (ref 12–46)
Lymphs Abs: 0.8 10*3/uL (ref 0.7–4.0)
MCH: 27.5 pg (ref 26.0–34.0)
MCHC: 32.2 g/dL (ref 30.0–36.0)
MCV: 85.7 fL (ref 78.0–100.0)
MONO ABS: 1.3 10*3/uL — AB (ref 0.1–1.0)
Monocytes Relative: 10 % (ref 3–12)
NEUTROS ABS: 11 10*3/uL — AB (ref 1.7–7.7)
NEUTROS PCT: 84 % — AB (ref 43–77)
Platelets: 286 10*3/uL (ref 150–400)
RBC: 4.61 MIL/uL (ref 3.87–5.11)
RDW: 21.1 % — ABNORMAL HIGH (ref 11.5–15.5)
WBC: 13.1 10*3/uL — AB (ref 4.0–10.5)

## 2015-05-09 LAB — LIPASE, BLOOD: Lipase: <10 U/L — ABNORMAL LOW (ref 22–51)

## 2015-05-09 LAB — URINE MICROSCOPIC-ADD ON

## 2015-05-09 LAB — PROTIME-INR
INR: 1.45 (ref 0.00–1.49)
Prothrombin Time: 17.7 seconds — ABNORMAL HIGH (ref 11.6–15.2)

## 2015-05-09 NOTE — ED Provider Notes (Signed)
CSN: [id number]     Arrival date & time 05/09/15  1037 History   First MD Initiated Contact with Patient 05/09/15 1053     Chief Complaint  Patient presents with  . Abdominal distention   . Shortness of Breath     (Consider location/radiation/quality/duration/timing/severity/associated sxs/prior Treatment) HPI Comments: Patient is a 49 year old female with history of pancreatic cancer currently undergoing palliative chemotherapy. She had an ultrasound-guided paracentesis performed 3 days ago for relief of symptomatic ascites. She returns today with a reaccumulation of this fluid and abdominal distention, which is making it difficult for her to breathe. She denies any fevers or chills. She denies any chest pain or productive cough. She also has a left nephrostomy tube in place and reports decreased drainage from this tube since the reaccumulation of the fluid.  Patient is a 49 y.o. female presenting with shortness of breath. The history is provided by the patient.  Shortness of Breath Severity:  Moderate Onset quality:  Gradual Duration:  3 days Timing:  Constant Progression:  Worsening Chronicity:  New Context: activity   Relieved by:  Nothing Worsened by:  Nothing tried Ineffective treatments:  None tried   Past Medical History  Diagnosis Date  . Hypertension   . Seasonal allergies   . GERD (gastroesophageal reflux disease)   . Wears glasses   . Headache     otc med prn  . Anemia   . Hx of radiation therapy 08/19/14- 09/24/14    pancreas/regional lymph nodes 4500 cGy 25 sessions  . History of gastritis   . 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 History of herpes genitalis   . Hydronephrosis, left   . Maintenance  chemotherapy     PALLITIVE   Past Surgical History  Procedure Laterality Date  . Cesarean section  2005  . Cholecystectomy N/A 10/02/2013    Procedure: LAPAROSCOPIC CHOLECYSTECTOMY WITH INTRAOPERATIVE CHOLANGIOGRAM POSSIBLE OPEN ;  Surgeon: Adin Hector, MD;  Location: [facility];  Service: General;  Laterality: N/A;  . Eus N/A 10/12/2013    Procedure: UPPER ENDOSCOPIC ULTRASOUND (EUS) LINEAR;  Surgeon: Milus Banister, MD;  Location: [facility];  Service: Endoscopy;  Laterality: N/A;  . Pancreas biopsy  10/12/13    FNA- pancreas head: malignant cells consistent w/adenocarcinoma  . Endoscopic retrograde cholangiopancreatography (ercp) with propofol N/A 10/27/2013    Procedure: ENDOSCOPIC RETROGRADE CHOLANGIOPANCREATOGRAPHY (ERCP) WITH PROPOFOL;  Surgeon: Milus Banister, MD;  Location: [facility];  Service: Endoscopy;  Laterality: N/A;  biliary stenting  . Portacath placement Left 10/28/2013    Procedure: INSERTION PORT-A-CATH LET SUBCLAVIAN;  Surgeon: Adin Hector, MD;  Location: [facility];  Service: General;  Laterality: Left;  . Intrauterine device (iud) insertion N/A 10/15/2014    Procedure: cervical dilation  with INTRAUTERINE DEVICE (IUD) INSERTION ;  Surgeon: Luz Lex, MD;  Location: [facility];  Service: Gynecology;  Laterality: N/A;  mirena  . Cystoscopy with stent placement Left 12/13/2014    Procedure: CYSTOSCOPY WITH STENT PLACEMENT;  Surgeon: Bernestine Amass, MD;  Location: [facility];  Service: Urology;  Laterality: Left;  . Cystoscopy w/ retrogrades Left 12/13/2014    Procedure: CYSTOSCOPY WITH RETROGRADE PYELOGRAM, LEFT  URETEROSCOPY;  Surgeon: Bernestine Amass, MD;  Location: [facility];  Service: Urology;  Laterality: Left;   Family History  Problem Relation Age of Onset  . Hypertension Mother   . Diabetes Maternal Grandfather    History  Substance Use Topics  . Smoking status: Never Smoker   . Smokeless tobacco: Never Used   . Alcohol Use: No   OB History    No data available     Review of Systems  Respiratory: Positive for shortness of breath.   All other systems reviewed and are negative.     Allergies  Review of patient's allergies indicates no known allergies.  Home Medications   Prior to Admission medications   Medication Sig Start Date End Date Taking? Authorizing Provider  lidocaine-prilocaine (EMLA) cream Apply 1 application topically as needed. Apply to [facility] site 1-2 hours prior to stick and cover with plastic wrap 12/31/14  Yes Owens Shark, NP  lipase/protease/amylase (CREON) 12000 UNITS CPEP capsule Take 3 capsules (36,000 Units total) by mouth 3 (three) times daily before meals. 12/31/14  Yes Owens Shark, NP  omeprazole (PRILOSEC) 40 MG capsule Take 1 capsule (40 mg total) by mouth 2 (two) times daily. 04/28/15  Yes Ladell Pier, MD  oxyCODONE-acetaminophen (PERCOCET/ROXICET) 5-325 MG per tablet Take 1 tablet by mouth every 4 (four) hours as needed for severe pain. 03/15/15  Yes Ladell Pier, MD  potassium chloride SA (K-DUR,KLOR-CON) 20 MEQ tablet Take 1 tablet (20 mEq total) by mouth 3 (three) times daily. 03/31/15  Yes Ladell Pier, MD  valACYclovir (VALTREX) 1000 MG tablet Take 1,000 mg by mouth daily as needed (outbreaks).    Yes Historical Provider, MD  ondansetron (ZOFRAN) 8 MG tablet Take 8 mg by mouth every 8 (eight) hours as needed for nausea or vomiting (start taking 72 hours after chemo tx).    Historical Provider, MD  promethazine (PHENERGAN) 25 MG tablet Take 25 mg by mouth every 6 (six) hours as needed for nausea or vomiting.    Historical Provider, MD   BP 115/70 mmHg  Pulse 136  Temp(Src) 97.5 F (36.4 C)  Resp 22  SpO2 98% Physical Exam  Constitutional: She is oriented to person, place, and time. She appears well-developed and well-nourished. No distress.  HENT:  Head: Normocephalic and atraumatic.  Neck: Normal range of motion. Neck supple.  Cardiovascular:  Normal rate and regular rhythm.  Exam reveals no gallop and no friction rub.   No murmur heard. Pulmonary/Chest: Effort normal and breath sounds normal. No respiratory distress. She has no wheezes.  Abdominal: Soft. Bowel sounds are normal. She exhibits distension. There is no tenderness. There is no rebound and no guarding.  There is abdominal distention with a fluid wave present.  Musculoskeletal: Normal range of motion.  Neurological: She is alert and oriented to person, place, and time.  Skin: Skin is warm and dry. She is not diaphoretic.  Nursing note and vitals reviewed.   ED Course  Procedures (including critical care time) Labs Review Labs Reviewed  URINALYSIS, ROUTINE W REFLEX MICROSCOPIC (NOT AT [facility])  COMPREHENSIVE METABOLIC PANEL  CBC WITH DIFFERENTIAL/PLATELET  LIPASE, BLOOD  PROTIME-INR    Imaging Review No results found.   EKG Interpretation None      MDM   Final diagnoses:  None    Patient will require a repeat paracentesis, however this is not available today. Interventional radiologist is tied up at [facility] with other procedures and arrangements will  be made tomorrow for this to be performed.    Veryl Speak, MD 05/09/15 [phone]

## 2015-05-09 NOTE — Discharge Instructions (Signed)
Return tomorrow at the given time for a repeat paracentesis.   Ascites Ascites is a gathering of fluid in the belly (abdomen). This is most often caused by liver disease. It may also be caused by a number of other less common problems. It causes a ballooning out (distension) of the abdomen. CAUSES  Scarring of the liver (cirrhosis) is the most common cause of ascites. Other causes include:  Infection or inflammation in the abdomen.  Cancer in the abdomen.  Heart failure.  Certain forms of kidney failure (nephritic syndrome).  Inflammation of the pancreas.  Clots in the veins of the liver. SYMPTOMS  In the early stages of ascites, you may not have any symptoms. The main symptom of ascites is a sense of abdominal bloating. This is due to the presence of fluid. This may also cause an increase in abdominal or waist size. People with this condition can develop swelling in the legs, and men can develop a swollen scrotum. When there is a lot of fluid, it may be hard to breath. Stretching of the abdomen by fluid can be painful. DIAGNOSIS  Certain features of your medical history, such as a history of liver disease and of an enlarging abdomen, can suggest the presence of ascites. The diagnosis of ascites can be made on physical exam by your caregiver. An abdominal ultrasound examination can confirm that ascites is present, and estimate the amount of fluid. Once ascites is confirmed, it is important to determine its cause. Again, a history of one of the conditions listed in "CAUSES" provides a strong clue. A physical exam is important, and blood and X-ray tests may be needed. During a procedure called paracentesis, a sample of fluid is removed from the abdomen. This can determine certain key features about the fluid, such as whether or not infection or cancer is present. Your caregiver will determine if a paracentesis is necessary. They will describe the procedure to you. PREVENTION  Ascites is a  complication of other conditions. Therefore to prevent ascites, you must seek treatment for any significant health conditions you have. Once ascites is present, careful attention to fluid and salt intake may help prevent it from getting worse. If you have ascites, you should not drink alcohol. PROGNOSIS  The prognosis of ascites depends on the underlying disease. If the disease is reversible, such as with certain infections or with heart failure, then ascites may improve or disappear. When ascites is caused by cirrhosis, then it indicates that the liver disease has worsened, and further evaluation and treatment of the liver disease is needed. If your ascites is caused by cancer, then the success or failure of the cancer treatment will determine whether your ascites will improve or worsen. RISKS AND COMPLICATIONS  Ascites is likely to worsen if it is not properly diagnosed and treated. A large amount of ascites can cause pain and difficulty breathing. The main complication, besides worsening, is infection (called spontaneous bacterial peritonitis). This requires prompt treatment. TREATMENT  The treatment of ascites depends on its cause. When liver disease is your cause, medical management using water pills (diuretics) and decreasing salt intake is often effective. Ascites due to peritoneal inflammation or malignancy (cancer) alone does not respond to salt restriction and diuretics. Hospitalization is sometimes required. If the treatment of ascites cannot be managed with medications, a number of other treatments are available. Your caregivers will help you decide which will work best for you. Some of these are:  Removal of fluid from the abdomen (paracentesis).  Fluid from the abdomen is passed into a vein (peritoneovenous shunting).  Liver transplantation.  Transjugular intrahepatic portosystemic stent shunt. HOME CARE INSTRUCTIONS  It is important to monitor body weight and the intake and output of  fluids. Weigh yourself at the same time every day. Record your weights. Fluid restriction may be necessary. It is also important to know your salt intake. The more salt you take in, the more fluid you will retain. Ninety percent of people with ascites respond to this approach.  Follow any directions for medicines carefully.  Follow up with your caregiver, as directed.  Report any changes in your health, especially any new or worsening symptoms.  If your ascites is from liver disease, avoid alcohol and other substances toxic to the liver. SEEK MEDICAL CARE IF:   Your weight increases more than a few pounds in a few days.  Your abdominal or waist size increases.  You develop swelling in your legs.  You had swelling and it worsens. SEEK IMMEDIATE MEDICAL CARE IF:   You develop a fever.  You develop new abdominal pain.  You develop difficulty breathing.  You develop confusion.  You have bleeding from the mouth, stomach, or rectum. MAKE SURE YOU:   Understand these instructions.  Will watch your condition.  Will get help right away if you are not doing well or get worse. Document Released: 11/26/2005 Document Revised: 02/18/2012 Document Reviewed: 06/27/2007 [facility] Patient Information 2015 [place], [place]. This information is not intended to replace advice given to you by your health care provider. Make sure you discuss any questions you have with your health care provider.

## 2015-05-09 NOTE — ED Notes (Signed)
MD at bedside. 

## 2015-05-09 NOTE — ED Notes (Signed)
Pt c/o increasing abdominal distention and SOB x 2 days.  Pain score 7/10.  Pt had a L nephrostomy placed on 5/27.  Hx of pancreatic CA.  Pt has not had chemo x 3 weeks.  Pt is aware that she needs a paracentisis.  Hx of several procedures.

## 2015-05-09 NOTE — ED Notes (Signed)
Nurse getting labs 

## 2015-05-10 ENCOUNTER — Telehealth: Payer: Self-pay | Admitting: *Deleted

## 2015-05-10 ENCOUNTER — Ambulatory Visit (HOSPITAL_COMMUNITY)
Admission: RE | Admit: 2015-05-10 | Discharge: 2015-05-10 | Disposition: A | Discharge status: Home / Self Care | Payer: 59 | Source: Ambulatory Visit | Attending: Emergency Medicine | Admitting: Emergency Medicine

## 2015-05-10 DIAGNOSIS — R188 Other ascites: Secondary | ICD-10-CM | POA: Diagnosis not present

## 2015-05-10 NOTE — Procedures (Signed)
Successful US guided paracentesis from RLQ.  Yielded 6.7 Liters of clear yellow fluid.  No immediate complications.  Pt tolerated well.   Specimen was not sent for labs.  Gareth Eagle R PA-C 05/10/2015 11:45 AM

## 2015-05-10 NOTE — Telephone Encounter (Signed)
THE [facility] EMERGENCY DEPARTMENT WAS UNABLE TO GET THE PARACENTESIS DONE. THE ED PHYSICIAN PUT IN AN ORDER FOR A PARACENTESIS ON 05/10/15. SPOKE TO CARRIE IN CENTRAL SCHEDULING. SHE WILL SCHEDULE THE PARACENTESIS AND CONTACT PT. NOTIFIED PT. OF THE ABOVE INFORMATION. SHE VOICES UNDERSTANDING.

## 2015-05-12 ENCOUNTER — Ambulatory Visit: Payer: 59

## 2015-05-12 ENCOUNTER — Other Ambulatory Visit: Payer: 59

## 2015-05-12 ENCOUNTER — Ambulatory Visit: Payer: 59 | Admitting: Nurse Practitioner

## 2015-05-12 ENCOUNTER — Encounter: Payer: 59 | Admitting: Nutrition

## 2015-05-13 ENCOUNTER — Ambulatory Visit (HOSPITAL_COMMUNITY)
Admission: RE | Admit: 2015-05-13 | Discharge: 2015-05-13 | Disposition: A | Discharge status: Home / Self Care | Payer: 59 | Source: Ambulatory Visit | Attending: Oncology | Admitting: Oncology

## 2015-05-13 ENCOUNTER — Other Ambulatory Visit: Payer: Self-pay | Admitting: *Deleted

## 2015-05-13 ENCOUNTER — Other Ambulatory Visit: Payer: Self-pay | Admitting: Diagnostic Radiology

## 2015-05-13 ENCOUNTER — Telehealth: Payer: Self-pay | Admitting: *Deleted

## 2015-05-13 DIAGNOSIS — R188 Other ascites: Secondary | ICD-10-CM | POA: Diagnosis not present

## 2015-05-13 DIAGNOSIS — C250 Malignant neoplasm of head of pancreas: Secondary | ICD-10-CM

## 2015-05-13 NOTE — Telephone Encounter (Signed)
TC from [facility] Radiology scheduling. Pt has appt today for US guided paracentesis @ 1:30 pm. Pt has been notified.

## 2015-05-13 NOTE — Telephone Encounter (Signed)
TC from patient who states she is very SOB and dyspnea occurs just walking from kitchen to bedroom. Right side of abdomen appears larger than left.  She is audibly short of breath to this listener. POF placed for US guided paracentesis and call made to central scheduling.  Waiting to hear back from scheduler.

## 2015-05-13 NOTE — Procedures (Signed)
Ultrasound-guided therapeutic paracentesis performed yielding 3.9 L turbid, light yellow fluid. No immediate complications.

## 2015-05-14 ENCOUNTER — Other Ambulatory Visit: Payer: Self-pay | Admitting: Oncology

## 2015-05-14 ENCOUNTER — Ambulatory Visit: Payer: 59

## 2015-05-16 ENCOUNTER — Telehealth: Payer: Self-pay | Admitting: *Deleted

## 2015-05-16 ENCOUNTER — Other Ambulatory Visit: Payer: Self-pay | Admitting: *Deleted

## 2015-05-16 DIAGNOSIS — C250 Malignant neoplasm of head of pancreas: Secondary | ICD-10-CM

## 2015-05-16 NOTE — Telephone Encounter (Addendum)
PT. IS SLIGHTLY SHORT OF BREATH AND HER ABDOMEN IS FEELING FULL. SHE WOULD LIKE A PARACENTESIS DONE TOMORROW. SPOKE TO [place] SCHEDULING. PT. TO HAVE PARACENTESIS DONE AT [facility]. PT. NEEDS TO BE AT [place] AT 12:45PM FOR PARACENTESIS AT 1:00PM. NOTIFIED PT. OF TOMORROW PARACENTESIS APPOINTMENT. PER DR.[place] FOR PARACENTESIS TWICE A WEEK ON Tuesday AND Friday. [place] IN CENTRAL SCHEDULING AND PT. AWARE OF FUTURE ORDERS.

## 2015-05-16 NOTE — Telephone Encounter (Signed)
Patient called back to provide information to obtain order for STANDARD WHEELCHAIR.  "I get tired and short of breath when I walk. Send order to [employer] [facility], Ph: [phone], Fax: [phone]. Order needs to include the following.  Diagnosis as to why I need the wheelchair  Type of wheelchair requested (I think I need a standard with the large wheels)  Accessories (pad/cushion, leg extensions etc.)  I need a cushion.  My name, D.O.B, address, phone number and Insurance ID number [id number] needs to be included on the order.  Insurance is [employer], environmental by [place]).  [facility] will review and contact us

## 2015-05-17 ENCOUNTER — Ambulatory Visit (HOSPITAL_COMMUNITY)
Admission: RE | Admit: 2015-05-17 | Discharge: 2015-05-17 | Disposition: A | Discharge status: Home / Self Care | Payer: 59 | Source: Ambulatory Visit | Attending: Oncology | Admitting: Oncology

## 2015-05-17 DIAGNOSIS — C250 Malignant neoplasm of head of pancreas: Secondary | ICD-10-CM

## 2015-05-17 DIAGNOSIS — R188 Other ascites: Secondary | ICD-10-CM | POA: Insufficient documentation

## 2015-05-17 MED ORDER — LIDOCAINE HCL (PF) 1 % IJ SOLN
INTRAMUSCULAR | Status: AC
  Filled 2015-05-17: qty 5

## 2015-05-17 MED ORDER — LIDOCAINE HCL (PF) 1 % IJ SOLN
INTRAMUSCULAR | Status: AC
  Filled 2015-05-17: qty 10

## 2015-05-18 ENCOUNTER — Telehealth: Payer: Self-pay | Admitting: *Deleted

## 2015-05-18 ENCOUNTER — Emergency Department (HOSPITAL_COMMUNITY)
Admission: EM | Admit: 2015-05-18 | Discharge: 2015-06-10 | Disposition: E | Payer: 59 | Attending: Emergency Medicine | Admitting: Emergency Medicine

## 2015-05-18 ENCOUNTER — Encounter (HOSPITAL_COMMUNITY): Payer: Self-pay | Admitting: Family Medicine

## 2015-05-18 ENCOUNTER — Other Ambulatory Visit: Payer: Self-pay | Admitting: *Deleted

## 2015-05-18 ENCOUNTER — Ambulatory Visit: Payer: 59

## 2015-05-18 ENCOUNTER — Other Ambulatory Visit: Payer: Self-pay

## 2015-05-18 ENCOUNTER — Encounter: Payer: 59 | Admitting: Nurse Practitioner

## 2015-05-18 ENCOUNTER — Emergency Department (HOSPITAL_COMMUNITY): Payer: 59

## 2015-05-18 DIAGNOSIS — Z8619 Personal history of other infectious and parasitic diseases: Secondary | ICD-10-CM | POA: Insufficient documentation

## 2015-05-18 DIAGNOSIS — K219 Gastro-esophageal reflux disease without esophagitis: Secondary | ICD-10-CM | POA: Diagnosis not present

## 2015-05-18 DIAGNOSIS — Z8507 Personal history of malignant neoplasm of pancreas: Secondary | ICD-10-CM | POA: Diagnosis not present

## 2015-05-18 DIAGNOSIS — C250 Malignant neoplasm of head of pancreas: Secondary | ICD-10-CM

## 2015-05-18 DIAGNOSIS — R140 Abdominal distension (gaseous): Secondary | ICD-10-CM | POA: Insufficient documentation

## 2015-05-18 DIAGNOSIS — I469 Cardiac arrest, cause unspecified: Secondary | ICD-10-CM | POA: Diagnosis not present

## 2015-05-18 DIAGNOSIS — Z79899 Other long term (current) drug therapy: Secondary | ICD-10-CM | POA: Diagnosis not present

## 2015-05-18 DIAGNOSIS — Z87448 Personal history of other diseases of urinary system: Secondary | ICD-10-CM | POA: Diagnosis not present

## 2015-05-18 DIAGNOSIS — Z973 Presence of spectacles and contact lenses: Secondary | ICD-10-CM | POA: Insufficient documentation

## 2015-05-18 DIAGNOSIS — Z862 Personal history of diseases of the blood and blood-forming organs and certain disorders involving the immune mechanism: Secondary | ICD-10-CM | POA: Insufficient documentation

## 2015-05-18 DIAGNOSIS — I10 Essential (primary) hypertension: Secondary | ICD-10-CM | POA: Insufficient documentation

## 2015-05-18 LAB — CBG MONITORING, ED: Glucose-Capillary: 77 mg/dL (ref 65–99)

## 2015-05-18 MED ORDER — AMIODARONE HCL IN DEXTROSE 360-4.14 MG/200ML-% IV SOLN
INTRAVENOUS | Status: AC
  Administered 2015-05-18: 60 mg/h via INTRAVENOUS
  Filled 2015-05-18: qty 200

## 2015-05-18 MED ORDER — EPINEPHRINE HCL 0.1 MG/ML IJ SOSY
PREFILLED_SYRINGE | INTRAMUSCULAR | Status: AC | PRN
  Administered 2015-05-18: 1 via INTRAVENOUS

## 2015-05-18 MED ORDER — SODIUM BICARBONATE 8.4 % IV SOLN
INTRAVENOUS | Status: AC | PRN
  Administered 2015-05-18 (×2): 100 meq via INTRAVENOUS

## 2015-05-18 MED ORDER — EPINEPHRINE HCL 1 MG/ML IJ SOLN
0.5000 ug/min | INTRAVENOUS | Status: DC
  Administered 2015-05-18: 20 ug/min via INTRAVENOUS
  Filled 2015-05-18: qty 4

## 2015-05-18 MED ORDER — ATROPINE SULFATE 1 MG/ML IJ SOLN
INTRAMUSCULAR | Status: AC | PRN
  Administered 2015-05-18 (×2): 0.4 mg via INTRAVENOUS

## 2015-05-18 MED ORDER — AMIODARONE HCL IN DEXTROSE 360-4.14 MG/200ML-% IV SOLN
60.0000 mg/h | Freq: Once | INTRAVENOUS | Status: AC
  Administered 2015-05-18: 60 mg/h via INTRAVENOUS

## 2015-05-18 MED ORDER — SODIUM CHLORIDE 0.9 % IV SOLN
INTRAVENOUS | Status: AC | PRN
  Administered 2015-05-18: 1000 mL via INTRAVENOUS

## 2015-05-18 MED ORDER — MAGNESIUM SULFATE 2 GM/50ML IV SOLN
2.0000 g | Freq: Once | INTRAVENOUS | Status: DC
Start: 2015-05-18 — End: 2015-05-18

## 2015-05-19 ENCOUNTER — Telehealth: Payer: Self-pay

## 2015-05-19 ENCOUNTER — Ambulatory Visit: Payer: 59 | Admitting: Nurse Practitioner

## 2015-05-19 ENCOUNTER — Other Ambulatory Visit: Payer: 59

## 2015-05-19 ENCOUNTER — Ambulatory Visit: Payer: 59

## 2015-05-19 NOTE — Telephone Encounter (Signed)
On 05/19/2015 I received a death certificate from [place]. After reviewing the patients chart we came to the conclusion that this patient is a patient of [facility] in [place]. I called the funeral home to let them know this and they are coming back to pick up the death certificate.

## 2015-05-20 ENCOUNTER — Ambulatory Visit (HOSPITAL_COMMUNITY): Payer: 59

## 2015-05-21 ENCOUNTER — Ambulatory Visit: Payer: 59

## 2015-05-23 ENCOUNTER — Telehealth: Payer: Self-pay | Admitting: *Deleted

## 2015-05-23 ENCOUNTER — Telehealth: Payer: Self-pay | Admitting: Oncology

## 2015-05-23 NOTE — Telephone Encounter (Signed)
[place] called asking if Dr. Benay Spice is provider and sign the death certificate.  They have gone to [place], ER and would like to get this signed.  Verbal order received and read back from Dr. Benay Spice that he will sign.

## 2015-05-23 NOTE — Telephone Encounter (Signed)
Death certificate signed by Dr. Benay Spice. Returned to [employer].

## 2015-05-23 NOTE — Telephone Encounter (Signed)
Received death certificate 07[birth year]

## 2015-06-02 ENCOUNTER — Ambulatory Visit: Payer: 59

## 2015-06-02 ENCOUNTER — Ambulatory Visit: Payer: 59 | Admitting: Oncology

## 2015-06-02 ENCOUNTER — Other Ambulatory Visit: Payer: 59

## 2015-06-10 NOTE — Code Documentation (Signed)
Dr. Regenia Skeeter speaking with Husband in family room.

## 2015-06-10 NOTE — Code Documentation (Signed)
CBG- 77 

## 2015-06-10 NOTE — Consult Note (Signed)
PCCM CONSULT  Frances [name] was a 50 y.o. female with hx of pancreatic cancer who collapsed at home.  Her husband witnessed this and call 911.  She was still having respiratory efforts.  CPR was started when EMS arrived.  She had shockable rhythm.  She had prolonged period of CPR, but regained her pulse.  She was started on epinephrine gtt.  Her heart rhythm changed to ventricular escape rhythm and she lost her pulse again.  Family was informed of events, and resuscitative efforts were stopped.  Final Diagnoses: Cardiac arrest Ventricular fibrillation Pancreatic cancer Protein calorie malnutrition  Frances Mires, MD [place] May 28, 2015, 12:17 PM Pager:  [phone] After 3pm call: [phone]

## 2015-06-10 NOTE — Code Documentation (Signed)
Patient's family at bedside with patient.

## 2015-06-10 NOTE — ED Notes (Signed)
Pt belongings discarded per husband request

## 2015-06-10 NOTE — Code Documentation (Signed)
Patient time of death occurred at 53.

## 2015-06-10 NOTE — Telephone Encounter (Signed)
Husband called this am to say that Frances [name] is very weak and cant keep her balance.  She had a paracentesis yesterday.  He feels like she may need a blood transfusion.  She has a tube to her kidney and it has had blood in it for the last 2 weeks.  Will bring her in to get labs @10 :30  and then see Selena Lesser NP @11am   (Husband is taking son to summer camp and that is the soonest he can get her here.)

## 2015-06-10 NOTE — ED Provider Notes (Signed)
CSN: [id number]     Arrival date & time Jun 02, 2015  1114 History   First MD Initiated Contact with Patient 06/02/2015 1143     Chief Complaint  Patient presents with  . Cardiac Arrest     (Consider location/radiation/quality/duration/timing/severity/associated sxs/prior Treatment) HPI   49 year old female with a history of metastatic pancreatic cancer presents after collapsing in front of her husband and being in cardiac arrest. EMS in total has been doing CPR for about one hour. She received over 10 epinephrine's and was shocked twice. Initial rhythm was V. Fib, since then has been PEA. The patient had no complaints prior to this.  Further history is limited due to the acuity of situation and no current family.  Past Medical History  Diagnosis Date  . Hypertension   . Seasonal allergies   . GERD (gastroesophageal reflux disease)   . Wears glasses   . Headache     otc med prn  . Anemia   . Hx of radiation therapy 08/19/14- 09/24/14    pancreas/regional lymph nodes 4500 cGy 25 sessions  . History of gastritis   . 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 History of herpes genitalis   . Hydronephrosis, left   . Maintenance chemotherapy     PALLITIVE   Past Surgical History  Procedure Laterality Date  . Cesarean section  2005  . Cholecystectomy N/A 10/02/2013    Procedure: LAPAROSCOPIC CHOLECYSTECTOMY WITH INTRAOPERATIVE CHOLANGIOGRAM POSSIBLE OPEN ;  Surgeon: Adin Hector, MD;  Location: [facility];  Service: General;  Laterality: N/A;  . Eus N/A 10/12/2013    Procedure: UPPER ENDOSCOPIC ULTRASOUND (EUS) LINEAR;  Surgeon: Milus Banister, MD;  Location: [facility];  Service: Endoscopy;  Laterality: N/A;  . Pancreas biopsy  10/12/13     FNA- pancreas head: malignant cells consistent w/adenocarcinoma  . Endoscopic retrograde cholangiopancreatography (ercp) with propofol N/A 10/27/2013    Procedure: ENDOSCOPIC RETROGRADE CHOLANGIOPANCREATOGRAPHY (ERCP) WITH PROPOFOL;  Surgeon: Milus Banister, MD;  Location: [facility];  Service: Endoscopy;  Laterality: N/A;  biliary stenting  . Portacath placement Left 10/28/2013    Procedure: INSERTION PORT-A-CATH LET SUBCLAVIAN;  Surgeon: Adin Hector, MD;  Location: [facility];  Service: General;  Laterality: Left;  . Intrauterine device (iud) insertion N/A 10/15/2014    Procedure: cervical dilation  with INTRAUTERINE DEVICE (IUD) INSERTION ;  Surgeon: Luz Lex, MD;  Location: [facility];  Service: Gynecology;  Laterality: N/A;  mirena  . Cystoscopy with stent placement Left 12/13/2014    Procedure: CYSTOSCOPY WITH STENT PLACEMENT;  Surgeon: Bernestine Amass, MD;  Location[facility]  Service: Urology;  Laterality: Left;  . Cystoscopy w/ retrogrades Left 12/13/2014    Procedure: CYSTOSCOPY WITH RETROGRADE PYELOGRAM, LEFT URETEROSCOPY;  Surgeon: Bernestine Amass, MD;  Location: [facility];  Service: Urology;  Laterality: Left;   Family History  Problem Relation Age of Onset  . Hypertension Mother   . Diabetes Maternal Grandfather    History  Substance Use Topics  . Smoking status: Never Smoker   . Smokeless tobacco: Never Used  . Alcohol Use: No   OB History    No data available  Review of Systems  Unable to perform ROS: Patient unresponsive      Allergies  Review of patient's allergies indicates no known allergies.  Home Medications   Prior to Admission medications   Medication Sig Start Date End Date Taking? Authorizing Provider  lidocaine-prilocaine (EMLA) cream Apply 1 application topically as needed. Apply to [facility] site 1-2 hours prior to stick and cover with plastic wrap 12/31/14   Owens Shark, NP   lipase/protease/amylase (CREON) 12000 UNITS CPEP capsule Take 3 capsules (36,000 Units total) by mouth 3 (three) times daily before meals. 12/31/14   Owens Shark, NP  omeprazole (PRILOSEC) 40 MG capsule Take 1 capsule (40 mg total) by mouth 2 (two) times daily. 04/28/15   Ladell Pier, MD  ondansetron (ZOFRAN) 8 MG tablet Take 8 mg by mouth every 8 (eight) hours as needed for nausea or vomiting (start taking 72 hours after chemo tx).    Historical Provider, MD  oxyCODONE-acetaminophen (PERCOCET/ROXICET) 5-325 MG per tablet Take 1 tablet by mouth every 4 (four) hours as needed for severe pain. 03/15/15   Ladell Pier, MD  potassium chloride SA (K-DUR,KLOR-CON) 20 MEQ tablet Take 1 tablet (20 mEq total) by mouth 3 (three) times daily. 03/31/15   Ladell Pier, MD  promethazine (PHENERGAN) 25 MG tablet Take 25 mg by mouth every 6 (six) hours as needed for nausea or vomiting.    Historical Provider, MD  valACYclovir (VALTREX) 1000 MG tablet Take 1,000 mg by mouth daily as needed (outbreaks).     Historical Provider, MD   SpO2  Physical Exam  Constitutional: She appears well-developed. She is intubated.  HENT:  Head: Normocephalic and atraumatic.  Right Ear: External ear normal.  Left Ear: External ear normal.  Nose: Nose normal.  Eyes: Right eye exhibits no discharge. Left eye exhibits no discharge.  Neck: Neck supple.  Cardiovascular:  Pulses:      Femoral pulses are 0 on the right side, and 0 on the left side. Pulmonary/Chest: Effort normal and breath sounds normal. She is intubated.  Abdominal: Soft. She exhibits distension.  Neurological: She is unresponsive. GCS eye subscore is 1. GCS verbal subscore is 1. GCS motor subscore is 1.  Skin: Skin is warm and dry. She is not diaphoretic.  Nursing note and vitals reviewed.   ED Course  INTUBATION Date/Time: 2015-06-06 12:16 PM Performed by: Sherwood Gambler Authorized by: Sherwood Gambler Consent: The procedure was performed in an  emergent situation. Indications: respiratory failure Intubation method: video-assisted Patient status: unconscious Preoxygenation: [place] Airway. Pretreatment medications: none Tube size: 7.0 mm Tube type: cuffed Number of attempts: 1 Cords visualized: yes Post-procedure assessment: CO2 detector and chest rise Breath sounds: equal Cuff inflated: yes ETT to lip: 24 cm   (including critical care time) Labs Review Labs Reviewed  COMPREHENSIVE METABOLIC PANEL  TROPONIN I  CBC WITH DIFFERENTIAL/PLATELET  URINE RAPID DRUG SCREEN (HOSP PERFORMED) NOT AT [place] (NOT AT [facility])  I-STAT CHEM 8, ED  I-STAT TROPOININ, ED  I-STAT CG4 LACTIC ACID, ED  I-STAT ARTERIAL BLOOD GAS, ED    Imaging Review US Paracentesis  05/17/2015   INDICATION: ascites  EXAM: ULTRASOUND-GUIDED PARACENTESIS  COMPARISON:  Previous paracentesis.  MEDICATIONS: 15 cc 1% lidocaine  COMPLICATIONS: None immediate  TECHNIQUE: Informed written consent was obtained from the patient after a discussion of the risks, benefits and alternatives to treatment. A timeout was performed prior to the initiation of the procedure.  Initial ultrasound  scanning demonstrates a large amount of ascites within the right lower abdominal quadrant. The right lower abdomen was prepped and draped in the usual sterile fashion. 1% lidocaine with epinephrine was used for local anesthesia. Under direct ultrasound guidance, a 19 gauge, 7-cm, Yueh catheter was introduced. An ultrasound image was saved for documentation purposed. The paracentesis was performed. The catheter was removed and a dressing was applied. The patient tolerated the procedure well without immediate post procedural complication.  FINDINGS: A total of approximately 6.1 liters of cloudy whitish fluid was removed.  IMPRESSION: Successful ultrasound-guided paracentesis yielding 6.1 liters of peritoneal fluid.  Read by:  Lavonia Drafts [facility]   Electronically  Signed   By: Corrie Mckusick D.O.   On: 05/17/2015 15:32     EKG Interpretation None      Cardiopulmonary Resuscitation (CPR) Procedure Note Directed/Performed by: Sherwood Gambler T I personally directed ancillary staff and/or performed CPR in an effort to regain return of spontaneous circulation and to maintain cardiac, neuro and systemic perfusion.   CRITICAL CARE Performed by: Sherwood Gambler T   Total critical care time: 40 minutes  Critical care time was exclusive of separately billable procedures and treating other patients.  Critical care was necessary to treat or prevent imminent or life-threatening deterioration.  Critical care was time spent personally by me on the following activities: development of treatment plan with patient and/or surrogate as well as nursing, discussions with consultants, evaluation of patient's response to treatment, examination of patient, obtaining history from patient or surrogate, ordering and performing treatments and interventions, ordering and review of laboratory studies, ordering and review of radiographic studies, pulse oximetry and re-evaluation of patient's condition.  MDM   Final diagnoses:  Cardiac arrest    Patient initially had a return of pulses after was found that she was in ventricular tachycardia after CPR in the emergency department. She was shocked, CPR was resumed and then she gained pulses back. She then lost pulses again was further resuscitated. Despite being put on an epinephrine drip with multiple other medicines she then decompensated again.  She does make very intermittent spontaneous breaths but after discussion with family decision was made to stop further aggressive care. She was noted to have a severely bradycardic and wide-complex rhythm as well as the intermittent breathing episodes. I discussed her very grim prognosis and thus we stopped further resuscitation. I had a discussion with both the husband as well as further  family members. The patient then died in the ER. She is not a medical examiner case given her significant comorbidities and no trauma.    Sherwood Gambler, MD 06-10-2015 [phone]

## 2015-06-10 NOTE — Code Documentation (Signed)
Fast Exam Negative

## 2015-06-10 NOTE — Code Documentation (Signed)
Patient's Husband at bedside.

## 2015-06-10 NOTE — Code Documentation (Signed)
Stopped medications and CPR at this time per request of Husband and Verbal order from Dr. Halford Chessman.

## 2015-06-10 NOTE — Code Documentation (Signed)
Pt presents via [facility] with witnessed Cardiac arrest. Pt was walking on a sidewalk with her family and collapsed at approx. 1015.  Family started CPR on patient until EMS arrived.  Pt has [place] Airway in place, 18g LAC with NS at 916mL and infiltrated left IO. Pt shocked x2 in Vfib and pt went into PEA.  Pt given 300 of Amiodorone and 12 Epis and 944mL NS. Pt is stage 3-4 Pancreatic Cancer. Last Epi given at 1109

## 2015-06-10 NOTE — Code Documentation (Signed)
Dr.Sood at bedside  

## 2015-06-10 NOTE — Telephone Encounter (Signed)
Addendum done to phone note today June 11, 2015

## 2015-06-10 NOTE — Progress Notes (Signed)
   2015-06-14 1200  Clinical Encounter Type  Visited With Patient;Family;Patient and family together;Health care provider  Visit Type Initial;Spiritual support;Social support;Code;Death;ED  Spiritual Encounters  Spiritual Needs Emotional  Stress Factors  Family Stress Factors Loss   Chaplain was paged to the ED at 11:17 AM and notified that the patient's family had arrived. While family arrived, medical team was doing CPR on patient. Chaplain escorted patient's husband, son, and a few other family members to consultation A. When chaplain asked what happened, patient's husband said they were walking the patient to the car for an eventual appointment at [facility] later today. When the patient got to the last step she went unresponsive and had trouble breathing. Chaplain was present while critical care doctor and ED physician updated patient's family. Patient's family understood the seriousness of the situation. Patient has been battling cancer for two years and patient's husband said that if she didn't make it she'd be the [place] [place]. Family's pastor was present the entire time and was also really supportive. Patient's husband and son were at bedside as the patient passed away. Patient's pastor prayed over the patient. Patient's son is school-aged and is having a particularly hard time. Chaplain obtained contact information of the patient's husband and gave it to the nurse. Rest of family is now grieving at bedside. Page Elodia Florence chaplain if further support needed.  Gar Ponto, Chaplain  12:58 PM

## 2015-06-10 NOTE — ED Notes (Signed)
Paged the chaplain for the family, in the waiting room.

## 2015-06-10 NOTE — Telephone Encounter (Signed)
Frances [name] called to say that when he was getting ready to bring [name] here, she passed out.  He called EMS.  There are there and she "does not have a pulse." They are taking her to [facility].  He will let us know the outcome of this.  Let him know that they are in our thoughts and prayers and we appreciated him letting us know.

## 2015-06-10 NOTE — Telephone Encounter (Signed)
Per Dr. Benay Spice; faxed order for pt to receive standard wheelchair to [facility] @ fax# [phone].

## 2015-06-10 NOTE — Code Documentation (Signed)
Ventilator Discontinued

## 2015-06-10 NOTE — Code Documentation (Signed)
Dr. Halford Chessman speaking with patient's husband in family room.

## 2015-06-10 NOTE — Progress Notes (Signed)
Per Dr Halford Chessman remove pt from vent at this time. Dr Halford Chessman at bedside

## 2015-06-10 DEATH — deceased

## 2015-06-24 IMAGING — CR DG SACRUM/COCCYX 2+V
3 series · 3 of 3 positions shown · non-contrast
Comparison: CT 08/03/2014.

CLINICAL DATA: Pain with radiation into sacrum and left hip for 3
weeks. No known injury. Initial evaluation.

EXAM:
SACRUM AND COCCYX - 2+ VIEW

[t sacrum a.p.]
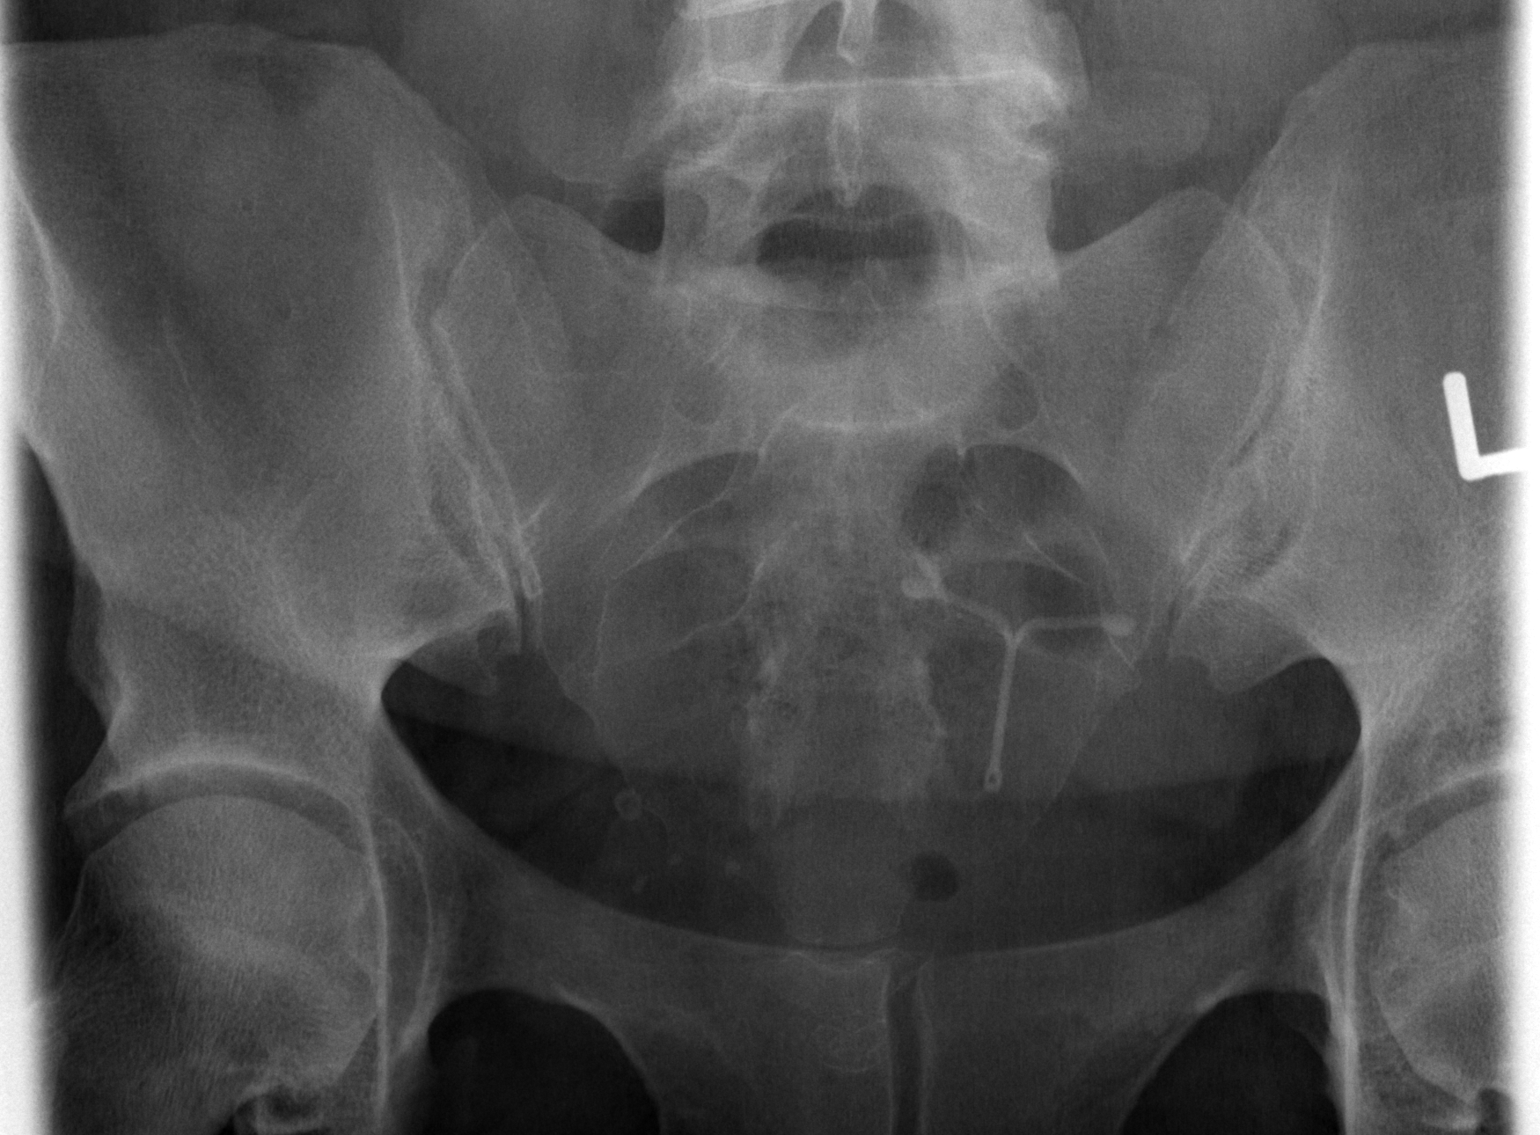

[t coccyx a.p.]
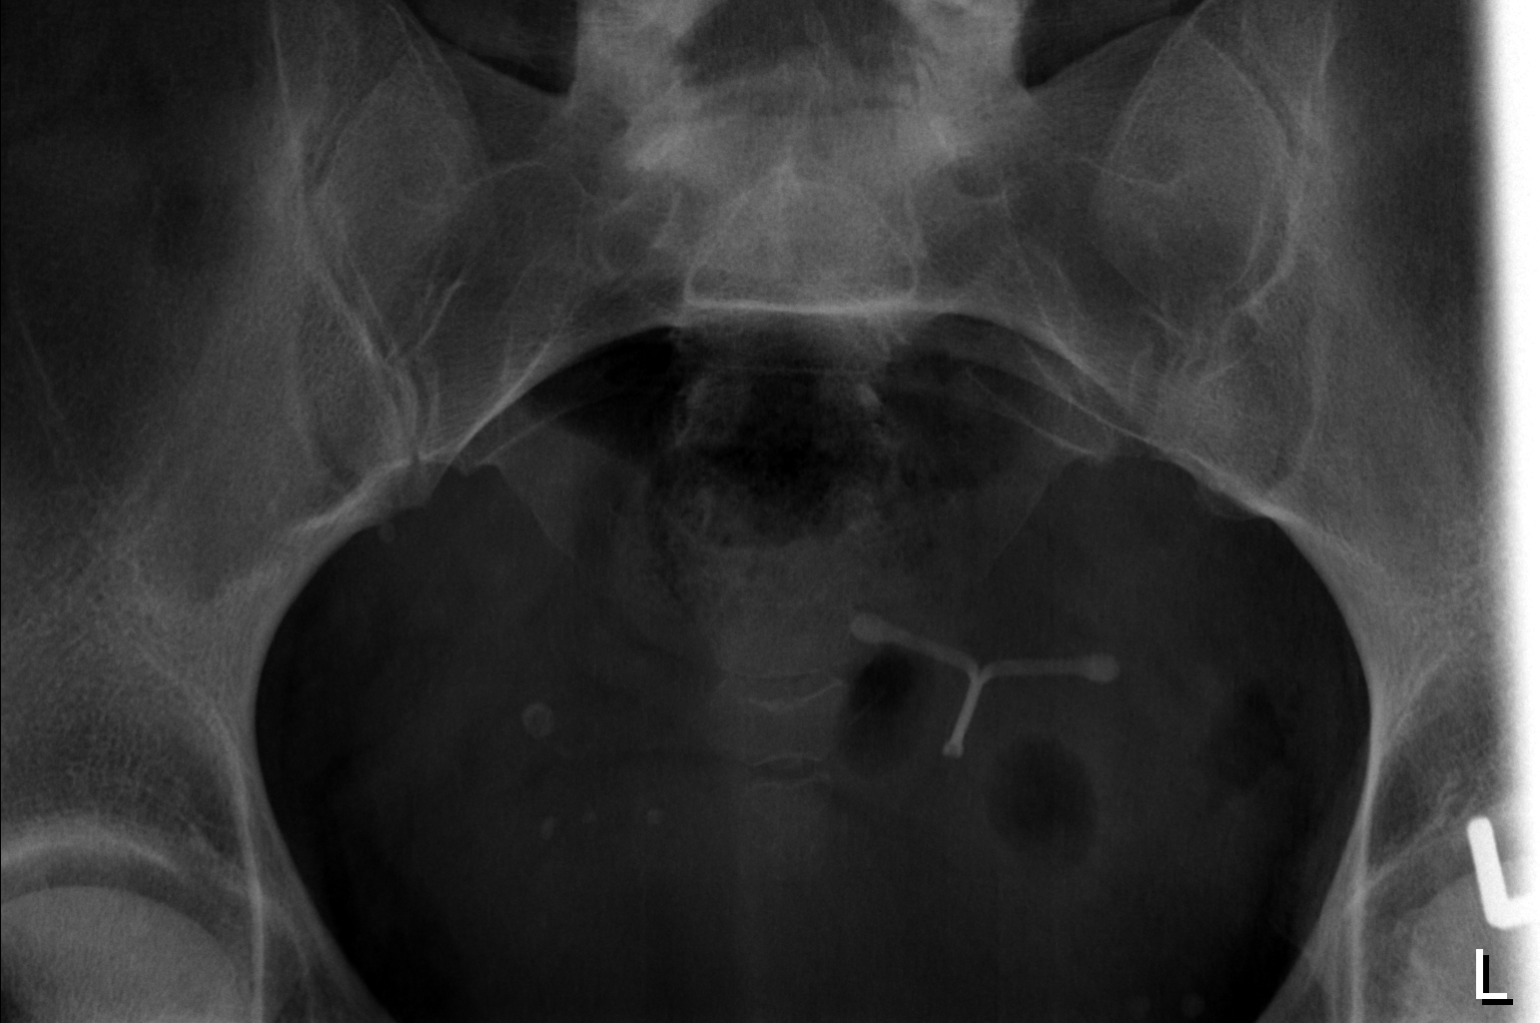

[t sacrum lat]
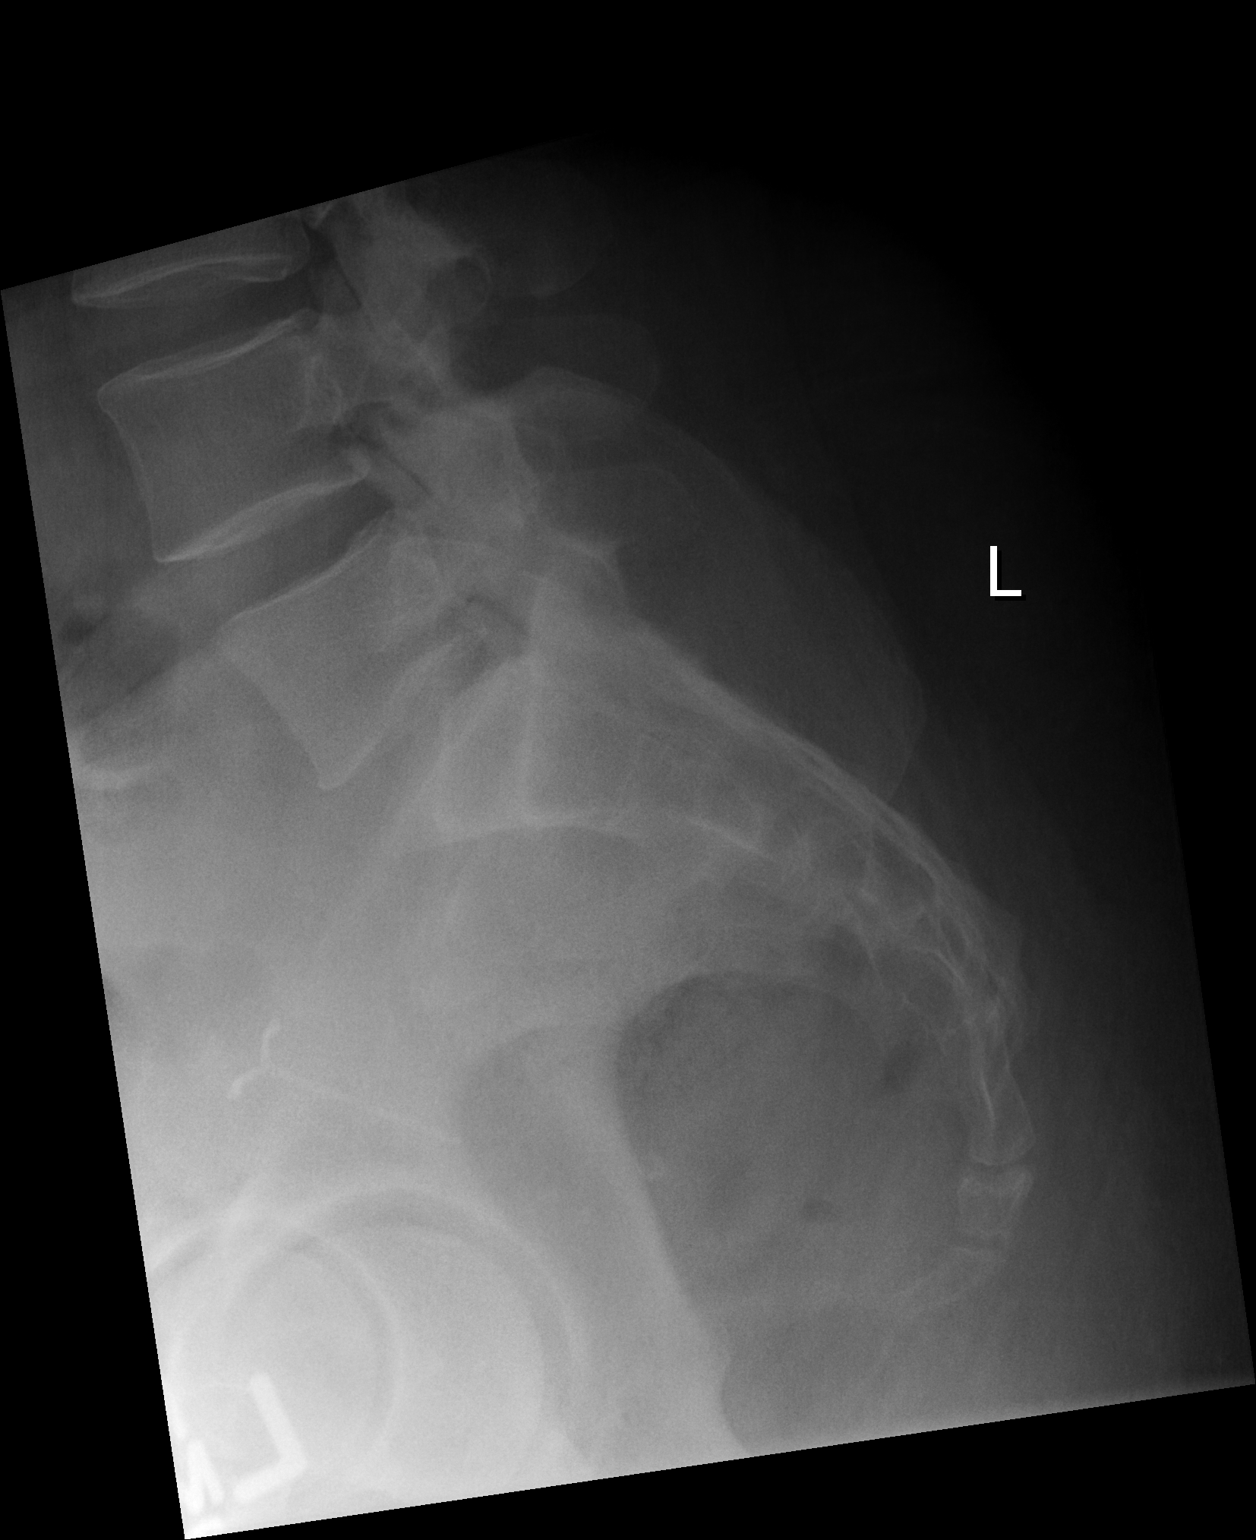

[3 of 3 positions shown; findings below may reference images not displayed]

FINDINGS: IUD is noted. No focal bony abnormality identified. No acute
abnormality. Pelvic phleboliths..
IMPRESSION: No focal abnormality identified.

## 2015-10-25 IMAGING — US US PARACENTESIS
1 series · 3 of 3 positions shown · non-contrast
Comparison: Previous paracentesis 02/28/2015.

CLINICAL DATA: Metastatic pancreatic cancer. Recurrent ascites.
Request therapeutic paracentesis.

EXAM:
ULTRASOUND GUIDED PARACENTESIS

[Series 1: us paracentesis · 0.27mm/px · 3 of 3 slices shown]
[im 1/3]
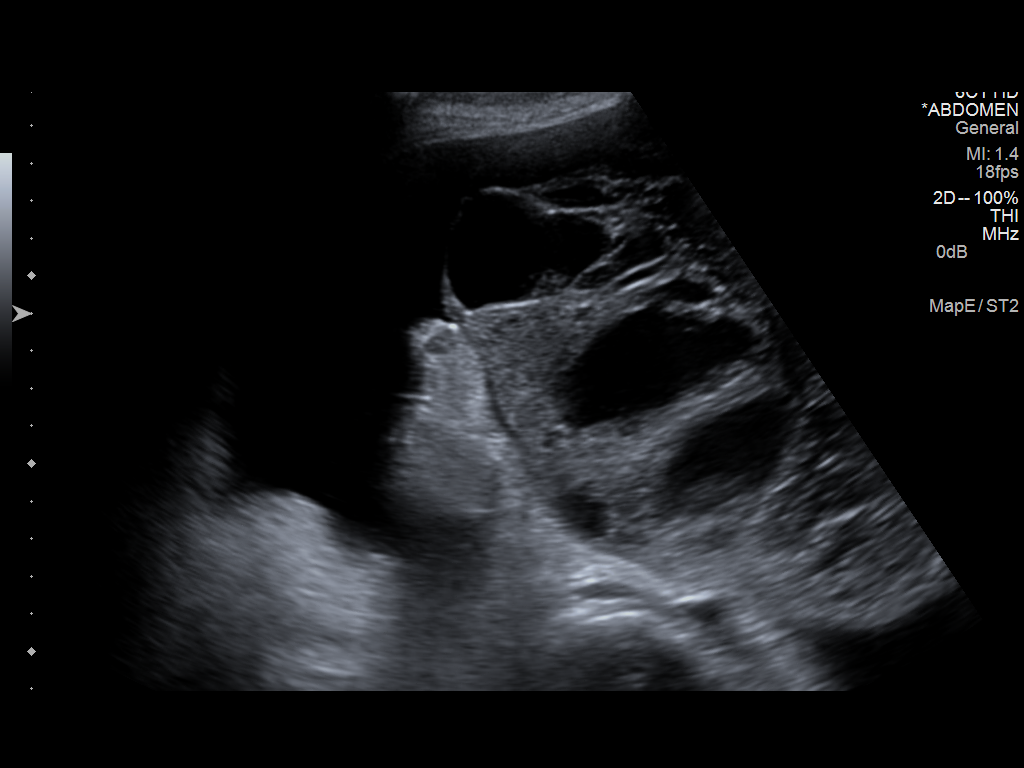
[im 2/3]
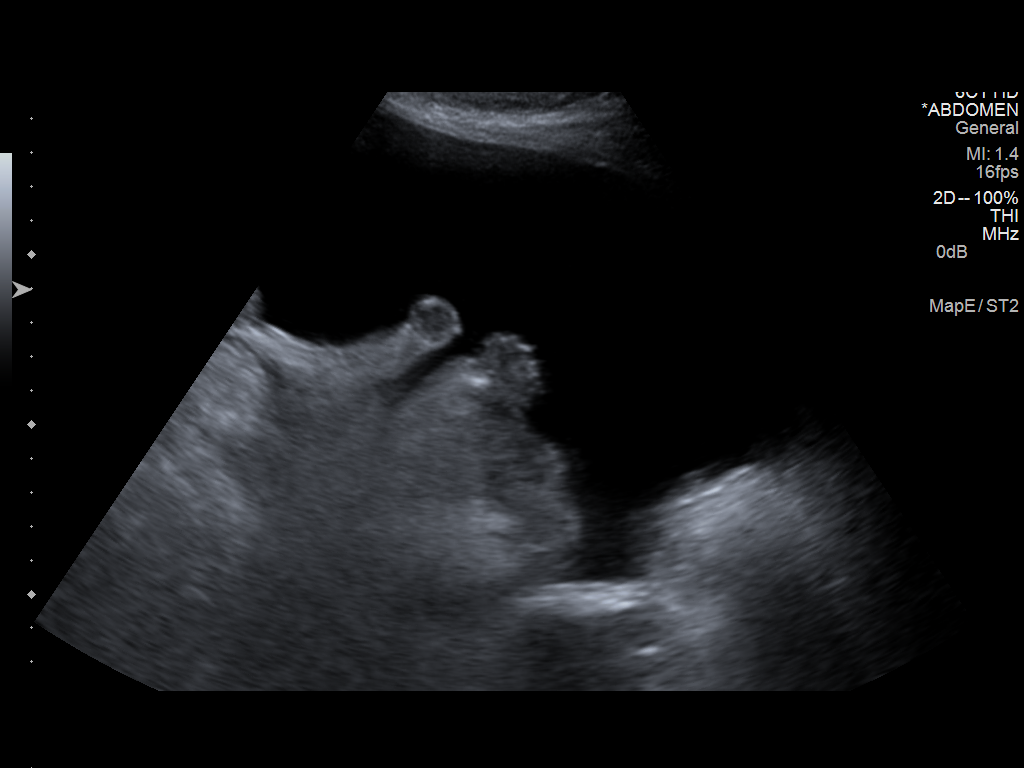
[im 3/3]
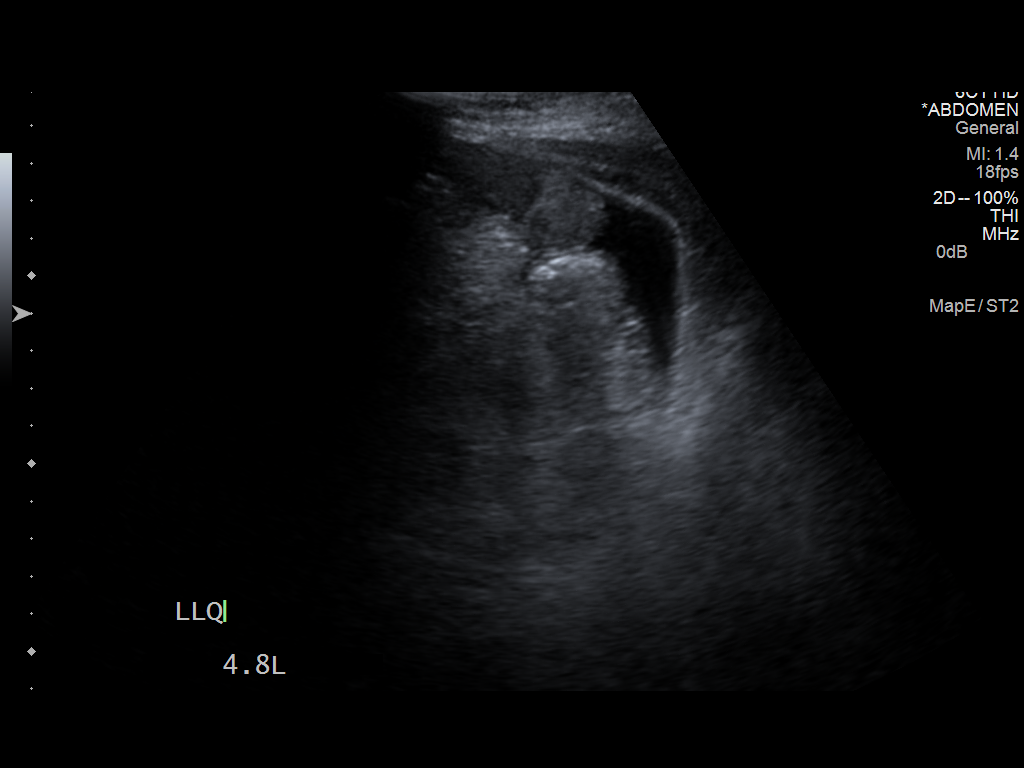

[3 of 3 positions shown; findings below may reference images not displayed]

PROCEDURE:
An ultrasound guided paracentesis was thoroughly discussed with the
patient and questions answered. The benefits, risks, alternatives
and complications were also discussed. The patient understands and
wishes to proceed with the procedure. Written consent was obtained.

Ultrasound was performed to localize and mark an adequate pocket of
fluid in the left lower quadrant of the abdomen. The area was then
prepped and draped in the normal sterile fashion. 1% Lidocaine was
used for local anesthesia. Under ultrasound guidance a 19 gauge Yueh
catheter was introduced. Paracentesis was performed. The catheter
was removed and a dressing applied.

COMPLICATIONS:
None immediate
FINDINGS: A total of approximately 4.8 L of clear yellow fluid was removed. A
fluid sample was none sent for laboratory analysis.
IMPRESSION: Successful ultrasound guided paracentesis yielding 4.8 L of ascites.

## 2016-01-03 IMAGING — US US PARACENTESIS
1 series · 10 of 10 positions shown · non-contrast
Comparison: Prior paracentesis on 05/10/15

MEDICATIONS:
None.

COMPLICATIONS:
None immediate

INDICATION: Pancreatic cancer, recurrent ascites, abdominal distention, dyspnea.
Request is made for therapeutic paracentesis.

EXAM:
ULTRASOUND-GUIDED THERAPEUTIC PARACENTESIS
TECHNIQUE: Informed written consent was obtained from the patient after a
discussion of the risks, benefits and alternatives to treatment. A
timeout was performed prior to the initiation of the procedure.

[Series 1: us paracentesis · 0.27mm/px · 10 of 10 slices shown]
[im 1/10]
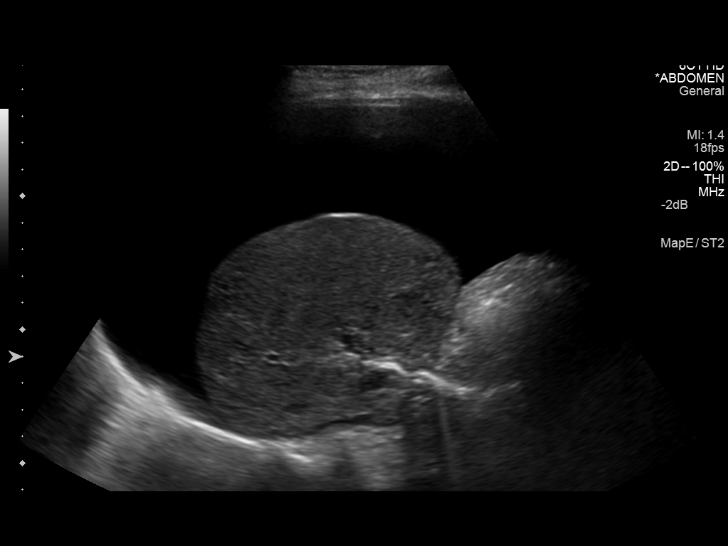
[im 2/10]
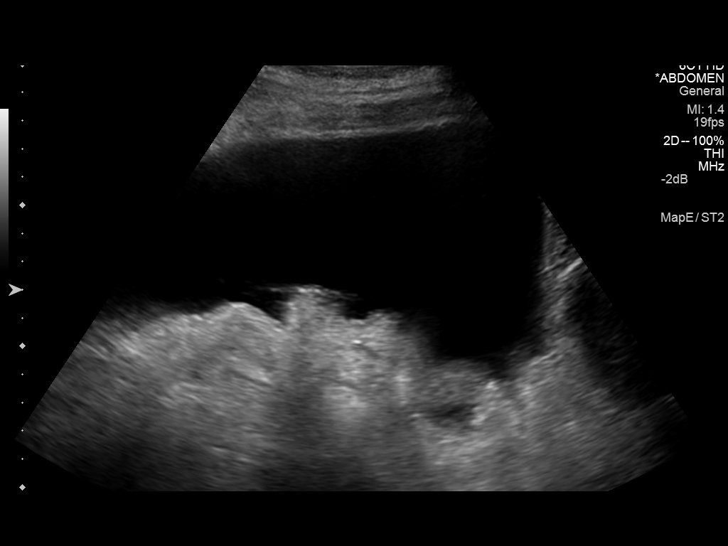
[im 3/10]
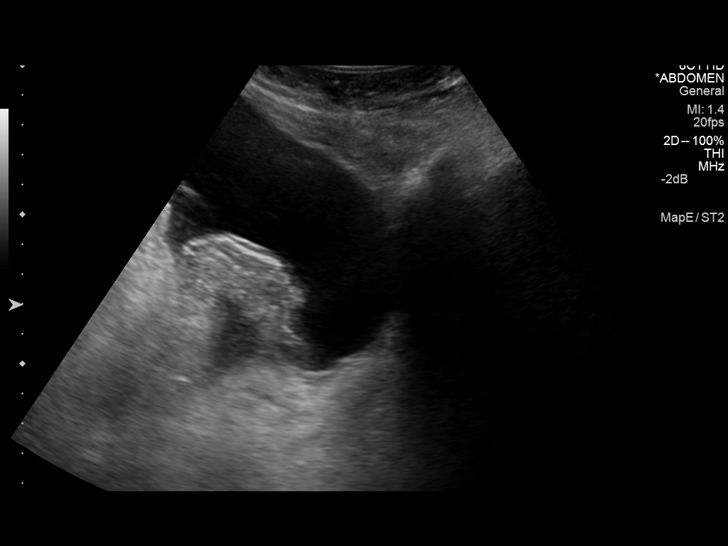
[im 4/10]
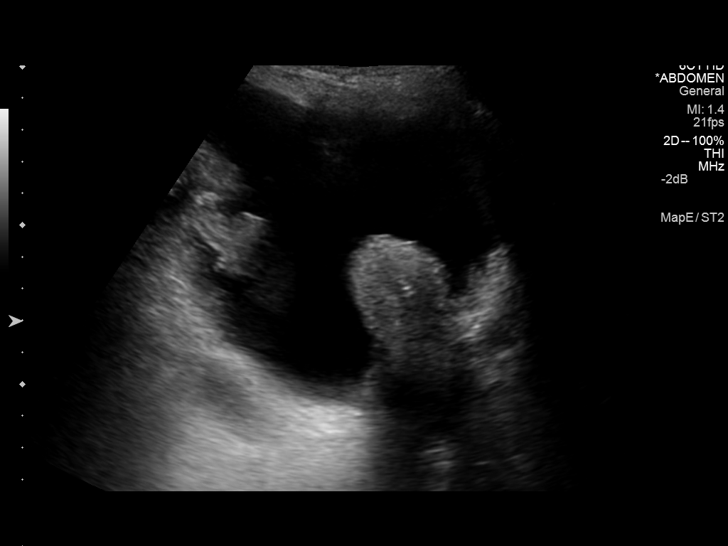
[im 5/10]
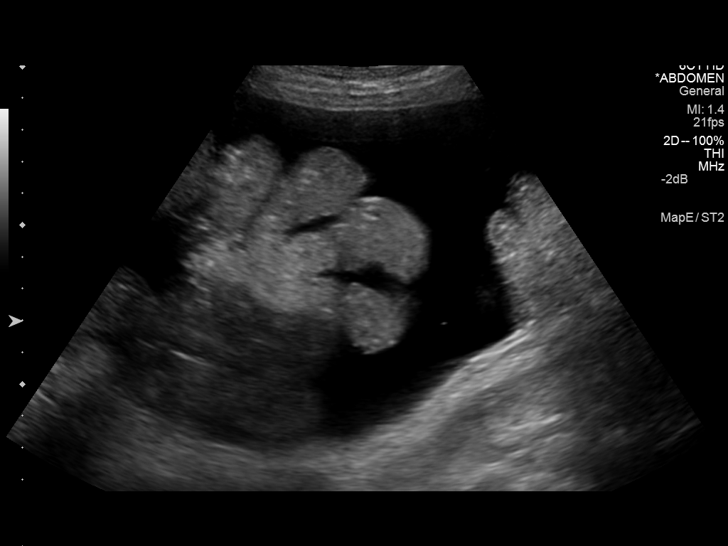
[im 6/10]
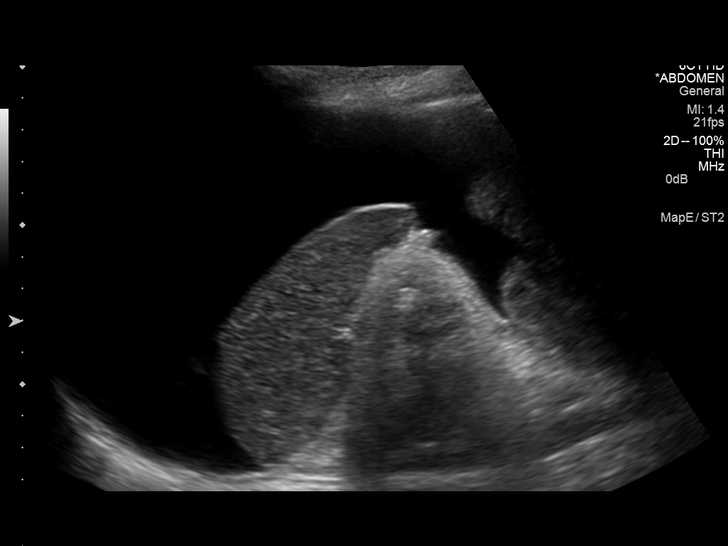
[im 7/10]
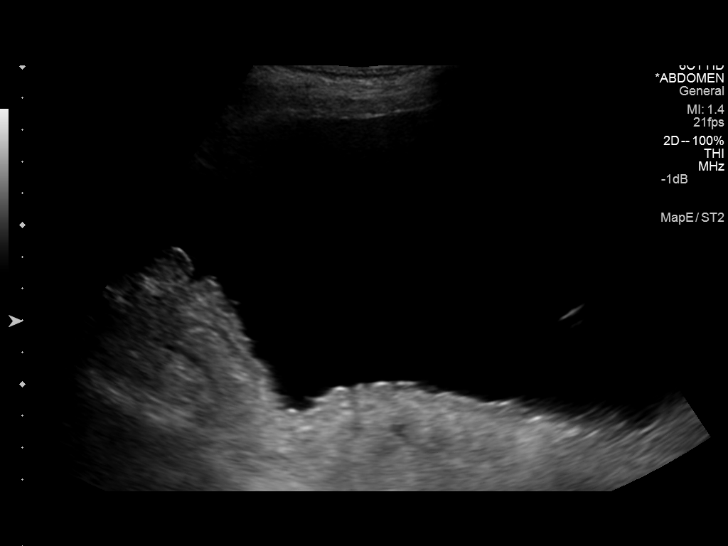
[im 8/10]
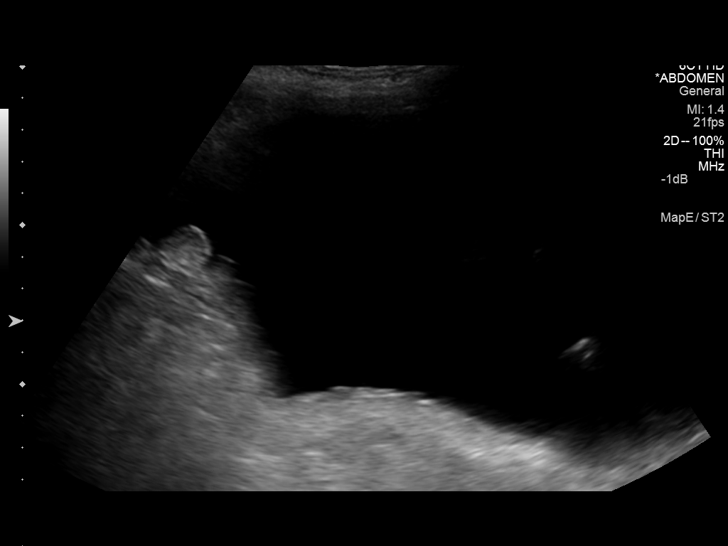
[im 9/10]
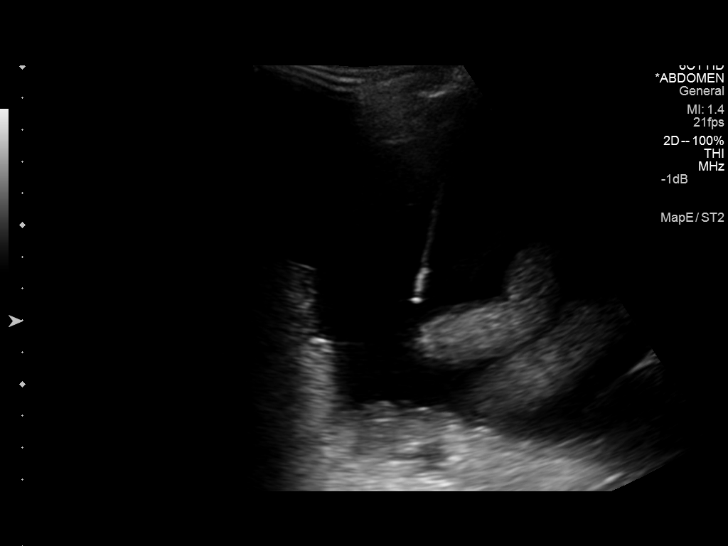
[im 10/10]
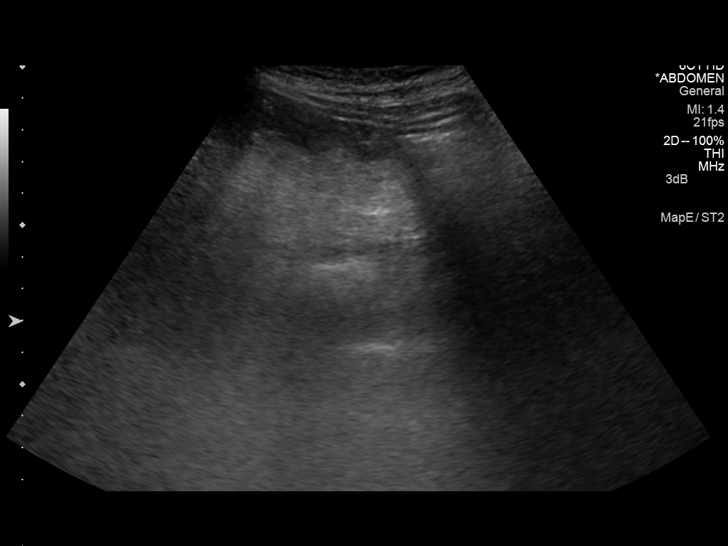

[10 of 10 positions shown; findings below may reference images not displayed]

Initial ultrasound scanning demonstrates a moderate amount of
ascites within the left lower abdominal quadrant. The left lower
abdomen was prepped and draped in the usual sterile fashion. 1%
lidocaine was used for local anesthesia. Under direct ultrasound
guidance, a 19 gauge, 10-cm, Yueh catheter was introduced. An
ultrasound image was saved for documentation purposed. The
paracentesis was performed. The catheter was removed and a dressing
was applied. The patient tolerated the procedure well without
immediate post procedural complication.
FINDINGS: A total of approximately 3.9 liters of turbid, light yellow fluid
was removed.
IMPRESSION: Successful ultrasound-guided therapeutic paracentesis yielding
liters of peritoneal fluid.

## 2016-05-25 ENCOUNTER — Other Ambulatory Visit: Payer: Self-pay | Admitting: Nurse Practitioner
